# Patient Record
Sex: Female | Born: 1935 | Hispanic: Yes | Smoking: Never smoker
Health system: Southern US, Community
[De-identification: ages and names within clinical notes are randomized; demographics above are authoritative.]

## PROBLEM LIST (undated history)

## (undated) DIAGNOSIS — D649 Anemia, unspecified: Secondary | ICD-10-CM

## (undated) DIAGNOSIS — N189 Chronic kidney disease, unspecified: Secondary | ICD-10-CM

## (undated) HISTORY — PX: SHOULDER SURGERY: SHX246

## (undated) HISTORY — PX: CATARACT EXTRACTION, BILATERAL: SHX1313

---

## 2017-08-10 DIAGNOSIS — H01003 Unspecified blepharitis right eye, unspecified eyelid: Secondary | ICD-10-CM | POA: Diagnosis not present

## 2017-08-10 DIAGNOSIS — H01006 Unspecified blepharitis left eye, unspecified eyelid: Secondary | ICD-10-CM | POA: Diagnosis not present

## 2017-08-10 DIAGNOSIS — H2511 Age-related nuclear cataract, right eye: Secondary | ICD-10-CM | POA: Diagnosis not present

## 2017-08-10 DIAGNOSIS — H538 Other visual disturbances: Secondary | ICD-10-CM | POA: Diagnosis not present

## 2017-08-30 DIAGNOSIS — H01006 Unspecified blepharitis left eye, unspecified eyelid: Secondary | ICD-10-CM | POA: Diagnosis not present

## 2017-08-30 DIAGNOSIS — H527 Unspecified disorder of refraction: Secondary | ICD-10-CM | POA: Diagnosis not present

## 2017-08-30 DIAGNOSIS — H2513 Age-related nuclear cataract, bilateral: Secondary | ICD-10-CM | POA: Diagnosis not present

## 2017-08-30 DIAGNOSIS — H2511 Age-related nuclear cataract, right eye: Secondary | ICD-10-CM | POA: Diagnosis not present

## 2018-02-22 DIAGNOSIS — H2513 Age-related nuclear cataract, bilateral: Secondary | ICD-10-CM | POA: Diagnosis not present

## 2018-02-22 DIAGNOSIS — H02105 Unspecified ectropion of left lower eyelid: Secondary | ICD-10-CM | POA: Diagnosis not present

## 2018-02-22 DIAGNOSIS — H01003 Unspecified blepharitis right eye, unspecified eyelid: Secondary | ICD-10-CM | POA: Diagnosis not present

## 2018-02-22 DIAGNOSIS — H40033 Anatomical narrow angle, bilateral: Secondary | ICD-10-CM | POA: Diagnosis not present

## 2018-06-20 DIAGNOSIS — Z01818 Encounter for other preprocedural examination: Secondary | ICD-10-CM | POA: Diagnosis not present

## 2018-06-20 DIAGNOSIS — H2512 Age-related nuclear cataract, left eye: Secondary | ICD-10-CM | POA: Diagnosis not present

## 2018-06-20 DIAGNOSIS — E119 Type 2 diabetes mellitus without complications: Secondary | ICD-10-CM | POA: Diagnosis not present

## 2018-06-21 ENCOUNTER — Ambulatory Visit: Payer: Self-pay | Admitting: Family Medicine

## 2018-06-29 DIAGNOSIS — H1013 Acute atopic conjunctivitis, bilateral: Secondary | ICD-10-CM | POA: Diagnosis not present

## 2018-07-13 DIAGNOSIS — H2512 Age-related nuclear cataract, left eye: Secondary | ICD-10-CM | POA: Diagnosis not present

## 2018-07-13 DIAGNOSIS — H25812 Combined forms of age-related cataract, left eye: Secondary | ICD-10-CM | POA: Diagnosis not present

## 2018-08-04 ENCOUNTER — Ambulatory Visit (INDEPENDENT_AMBULATORY_CARE_PROVIDER_SITE_OTHER): Payer: Medicare Other | Admitting: Family Medicine

## 2018-08-04 ENCOUNTER — Encounter: Payer: Self-pay | Admitting: Family Medicine

## 2018-08-04 VITALS — BP 126/73 | HR 76 | Ht 60.0 in | Wt 89.8 lb

## 2018-08-04 DIAGNOSIS — K8050 Calculus of bile duct without cholangitis or cholecystitis without obstruction: Secondary | ICD-10-CM | POA: Diagnosis not present

## 2018-08-04 DIAGNOSIS — J3089 Other allergic rhinitis: Secondary | ICD-10-CM

## 2018-08-04 DIAGNOSIS — M810 Age-related osteoporosis without current pathological fracture: Secondary | ICD-10-CM | POA: Diagnosis not present

## 2018-08-04 DIAGNOSIS — E538 Deficiency of other specified B group vitamins: Secondary | ICD-10-CM

## 2018-08-04 DIAGNOSIS — R5382 Chronic fatigue, unspecified: Secondary | ICD-10-CM

## 2018-08-04 DIAGNOSIS — R636 Underweight: Secondary | ICD-10-CM | POA: Diagnosis not present

## 2018-08-04 DIAGNOSIS — E786 Lipoprotein deficiency: Secondary | ICD-10-CM | POA: Diagnosis not present

## 2018-08-04 DIAGNOSIS — K8044 Calculus of bile duct with chronic cholecystitis without obstruction: Secondary | ICD-10-CM | POA: Insufficient documentation

## 2018-08-04 DIAGNOSIS — R0989 Other specified symptoms and signs involving the circulatory and respiratory systems: Secondary | ICD-10-CM | POA: Diagnosis not present

## 2018-08-04 DIAGNOSIS — E559 Vitamin D deficiency, unspecified: Secondary | ICD-10-CM | POA: Diagnosis not present

## 2018-08-04 DIAGNOSIS — R7989 Other specified abnormal findings of blood chemistry: Secondary | ICD-10-CM | POA: Diagnosis not present

## 2018-08-04 MED ORDER — FLUTICASONE PROPIONATE 50 MCG/ACT NA SUSP: NASAL | 11 refills | Status: DC
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
## 2018-08-04 NOTE — Patient Instructions (Addendum)
- pt and son were told that we will not contact them re: lab results unless they are critical and we will discuss them all at f/up OV in near future    You can use over-the-counter afrin nasal spray for up to 3 days (NO longer than that) which will help acutely with nasal drainage/ congestion short term.     Also, sterile saline nasal rinses, such as Milta Deiters med or AYR sinus rinses, can be very helpful and should be done twice daily- especially throughout the allergy season.   Remember you should use distilled water or previously boiled water to do this.  Then you may use over-the-counter Flonase 1 spray each nostril twice daily after sinus rinses.  You can do this in addition to taking any Allegra or Claritin or Zyrtec etc. that you may be taking daily.  If your eyes tend to get an itchy or irritated feeling when your seasonal allergies get bad, you can use Naphcon-A over-the-counter eyedrops as needed     How to Increase Your Level of Physical Activity  Getting regular physical activity is important for your overall health and well-being. Most people do not get enough exercise. There are easy ways to increase your level of physical activity, even if you have not been very active in the past or you are just starting out. Why is physical activity important? Physical activity has many short-term and long-term health benefits. Regular exercise can:  Help you lose weight or maintain a healthy weight.  Strengthen your muscles and bones.  Boost your mood and improve self-esteem.  Reduce your risk of certain long-term (chronic) diseases, like heart disease, cancer, and diabetes.  Help you stay capable of walking and moving around (mobile) as you age.  Prevent accidents, such as falls, as you age.  Increase life expectancy.  What are the benefits of being physically active on a regular basis? In addition to improving your physical health, being physically active on most days of the week  can help you in ways that you may not expect. Benefits of regular physical activity may include:  Feeling good about your body.  Being able to move around more easily and for longer periods of time without getting tired (increased stamina).  Finding new sources of fun and enjoyment.  Meeting new people who share a common interest.  Being able to fight off illness better (enhanced immunity).  Being able to sleep better.  What can happen if I am not physically active on a regular basis? Not getting enough physical activity can lead to an unhealthy lifestyle and future health problems. This can increase your chances of:  Becoming overweight or obese.  Becoming sick.  Developing chronic illnesses, like heart disease or diabetes.  Having mental health problems, like depression or anxiety.  Having sleep problems.  Having trouble walking or getting yourself around (reduced mobility).  Injuring yourself in a fall as you get older.  What steps can I take to be more physically active?  Check with your health care provider about how to get started. Ask your health care provider what activities are safe for you.  Start out slowly. Walking or doing some simple chair exercises is a good place to start, especially if you have not been active before or for a long time.  Try to find activities that you enjoy. You are more likely to commit to an exercise routine if it does not feel like a chore.  If you have bone or  joint problems, choose low-impact exercises, like walking or swimming.  Include physical activity in your everyday routine.  Invite friends or family members to exercise with you. This also will help you commit to your workout plan.  Set goals that you can work toward.  Aim for at least 150 minutes of moderate-intensity exercise each week. Examples of moderate-intensity exercise include walking or riding a bike. Where to find more information:  Centers for Disease Control  and Prevention: BowlingGrip.is  [employer] on [place] www.http://villegas.org/  ChooseMyPlate: WirelessM[employer]gages.dk Contact a health care provider if:  You have headaches, muscle aches, or joint pain.  You feel dizzy or light-headed while exercising.  You faint.  You have chest pain while exercising. Summary  Exercise benefits your mind and body at any age, even if you are just starting out.  If you have a chronic illness or have not been active for a while, check with your health care provider before increasing your physical activity.  Choose activities that are safe and enjoyable for you.Ask your health care provider what activities are safe for you.  Start slowly. Tell your health care provider if you have problems as you start to increase your activity level. This information is not intended to replace advice given to you by your health care provider. Make sure you discuss any questions you have with your health care provider. Document Released: 11/25/2016 Document Revised: 11/25/2016 Document Reviewed: 11/25/2016 Elsevier Interactive Patient Education  [employer].

## 2018-08-07 LAB — COMPREHENSIVE METABOLIC PANEL
A/G RATIO: 2 (ref 1.2–2.2)
ALBUMIN: 4.9 g/dL — AB (ref 3.2–4.6)
ALT: 16 IU/L (ref 0–32)
AST: 24 IU/L (ref 0–40)
Alkaline Phosphatase: 78 IU/L (ref 39–117)
BUN/Creatinine Ratio: 35 — ABNORMAL HIGH (ref 12–28)
BUN: 41 mg/dL — ABNORMAL HIGH (ref 10–36)
Bilirubin Total: 1.7 mg/dL — ABNORMAL HIGH (ref 0.0–1.2)
CALCIUM: 9.3 mg/dL (ref 8.7–10.3)
CO2: 16 mmol/L — ABNORMAL LOW (ref 20–29)
Chloride: 110 mmol/L — ABNORMAL HIGH (ref 96–106)
Creatinine, Ser: 1.17 mg/dL — ABNORMAL HIGH (ref 0.57–1.00)
GFR, EST AFRICAN AMERICAN: 47 mL/min/{1.73_m2} — AB (ref >59)
GFR, EST NON AFRICAN AMERICAN: 41 mL/min/{1.73_m2} — AB (ref >59)
GLOBULIN, TOTAL: 2.4 g/dL (ref 1.5–4.5)
Glucose: 77 mg/dL (ref 65–99)
POTASSIUM: 4 mmol/L (ref 3.5–5.2)
Sodium: 149 mmol/L — ABNORMAL HIGH (ref 134–144)
TOTAL PROTEIN: 7.3 g/dL (ref 6.0–8.5)

## 2018-08-07 LAB — CBC WITH DIFFERENTIAL/PLATELET
BASOS: 0 %
Basophils Absolute: 0 10*3/uL (ref 0.0–0.2)
EOS (ABSOLUTE): 0 10*3/uL (ref 0.0–0.4)
EOS: 1 %
HEMATOCRIT: 28.1 % — AB (ref 34.0–46.6)
HEMOGLOBIN: 9.4 g/dL — AB (ref 11.1–15.9)
IMMATURE GRANS (ABS): 0 10*3/uL (ref 0.0–0.1)
IMMATURE GRANULOCYTES: 0 %
LYMPHS: 19 %
Lymphocytes Absolute: 0.8 10*3/uL (ref 0.7–3.1)
MCH: 29.7 pg (ref 26.6–33.0)
MCHC: 33.5 g/dL (ref 31.5–35.7)
MCV: 89 fL (ref 79–97)
Monocytes Absolute: 0.8 10*3/uL (ref 0.1–0.9)
Monocytes: 18 %
NEUTROS PCT: 62 %
Neutrophils Absolute: 2.6 10*3/uL (ref 1.4–7.0)
PLATELETS: 264 10*3/uL (ref 150–450)
RBC: 3.16 x10E6/uL — ABNORMAL LOW (ref 3.77–5.28)
RDW: 14.8 % (ref 12.3–15.4)
WBC: 4.2 10*3/uL (ref 3.4–10.8)

## 2018-08-07 LAB — LIPID PANEL
CHOLESTEROL TOTAL: 82 mg/dL — AB (ref 100–199)
Chol/HDL Ratio: 2.6 ratio (ref 0.0–4.4)
HDL: 31 mg/dL — ABNORMAL LOW (ref >39)
LDL CALC: 39 mg/dL (ref 0–99)
Triglycerides: 59 mg/dL (ref 0–149)
VLDL CHOLESTEROL CAL: 12 mg/dL (ref 5–40)

## 2018-08-07 LAB — HEMOGLOBIN A1C
ESTIMATED AVERAGE GLUCOSE: 85 mg/dL
Hgb A1c MFr Bld: 4.6 % — ABNORMAL LOW (ref 4.8–5.6)

## 2018-08-07 LAB — BILIRUBIN, FRACTIONATED(TOT/DIR/INDIR)
Bilirubin, Direct: 0.3 mg/dL (ref 0.00–0.40)
Bilirubin, Indirect: 1.4 mg/dL — ABNORMAL HIGH (ref 0.10–0.80)

## 2018-08-07 LAB — GAMMA GT: GGT: 10 IU/L (ref 0–60)

## 2018-08-07 LAB — TSH: TSH: 0.122 u[IU]/mL — AB (ref 0.450–4.500)

## 2018-08-07 LAB — VITAMIN D 25 HYDROXY (VIT D DEFICIENCY, FRACTURES): Vit D, 25-Hydroxy: 28.2 ng/mL — ABNORMAL LOW (ref 30.0–100.0)

## 2018-08-07 LAB — VITAMIN B12: Vitamin B-12: 859 pg/mL (ref 232–1245)

## 2018-08-17 ENCOUNTER — Ambulatory Visit (INDEPENDENT_AMBULATORY_CARE_PROVIDER_SITE_OTHER): Payer: Medicare Other | Admitting: Family Medicine

## 2018-08-17 ENCOUNTER — Encounter: Payer: Self-pay | Admitting: Family Medicine

## 2018-08-17 VITALS — BP 120/70 | HR 71 | Ht 60.0 in | Wt 89.3 lb

## 2018-08-17 DIAGNOSIS — K8050 Calculus of bile duct without cholangitis or cholecystitis without obstruction: Secondary | ICD-10-CM

## 2018-08-17 DIAGNOSIS — E786 Lipoprotein deficiency: Secondary | ICD-10-CM | POA: Insufficient documentation

## 2018-08-17 DIAGNOSIS — N183 Chronic kidney disease, stage 3 unspecified: Secondary | ICD-10-CM | POA: Insufficient documentation

## 2018-08-17 DIAGNOSIS — E559 Vitamin D deficiency, unspecified: Secondary | ICD-10-CM

## 2018-08-17 DIAGNOSIS — E860 Dehydration: Secondary | ICD-10-CM

## 2018-08-17 DIAGNOSIS — R7989 Other specified abnormal findings of blood chemistry: Secondary | ICD-10-CM

## 2018-08-17 DIAGNOSIS — M810 Age-related osteoporosis without current pathological fracture: Secondary | ICD-10-CM

## 2018-08-17 DIAGNOSIS — K8044 Calculus of bile duct with chronic cholecystitis without obstruction: Secondary | ICD-10-CM | POA: Diagnosis not present

## 2018-08-17 DIAGNOSIS — N1832 Chronic kidney disease, stage 3b: Secondary | ICD-10-CM | POA: Insufficient documentation

## 2018-08-17 DIAGNOSIS — D649 Anemia, unspecified: Secondary | ICD-10-CM

## 2018-08-17 MED ORDER — VITAMIN D-3 25 MCG (1000 UT) PO CAPS: 2.0000 | ORAL_CAPSULE | Freq: Every day | ORAL | Status: DC

## 2018-08-17 NOTE — Patient Instructions (Addendum)
Melissa please put in future labs for CBC, CMP, TSH, free T3 and free T4, vitamin D, A1c.  -Please make sure you do not forget to do the Hemoccult cards and drop them by at your convenience.   Nine ways to increase your "good" HDL cholesterol  High-density lipoprotein (HDL) is often referred to as the "good" cholesterol. Having high HDL levels helps carry cholesterol from your arteries to your liver, where it can be used or excreted.  Having high levels of HDL also has antioxidant and anti-inflammatory effects, and is linked to a reduced risk of heart disease (1, 2).  Most health experts recommend minimum blood levels of 40 mg/dl in men and 50 mg/dl in women.  While genetics definitely play a role, there are several other factors that affect HDL levels.  Here are nine healthy ways to raise your "good" HDL cholesterol.  1. Consume olive oil  two pieces of salmon on a plate olive oil being poured into a small dish Extra virgin olive oil may be more healthful than processed olive oils. Olive oil is one of the healthiest fats around.  A large analysis of 42 studies with more than 800,000 participants found that olive oil was the only source of monounsaturated fat that seemed to reduce heart disease risk (3).  Research has shown that one of olive oil's heart-healthy effects is an increase in HDL cholesterol. This effect is thought to be caused by antioxidants it contains called polyphenols (4, 5, 6, 7).  Extra virgin olive oil has more polyphenols than more processed olive oils, although the amount can still vary among different types and brands.  One study gave 200 healthy young men about 2 tablespoons (25 ml) of different olive oils per day for three weeks.  The researchers found that participants' HDL levels increased significantly more after they consumed the olive oil with the highest polyphenol content (6).  In another study, when 74 older adults consumed about 4 tablespoons (50  ml) of high-polyphenol extra virgin olive oil every day for six weeks, their HDL cholesterol increased by 6.5 mg/dl, on average (7).  In addition to raising HDL levels, olive oil has been found to boost HDL's anti-inflammatory and antioxidant function in studies of older people and individuals with high cholesterol levels ( 7, 8, 9).  Whenever possible, select high-quality, certified extra virgin olive oils, which tend to be highest in polyphenols.  Bottom line: Extra virgin olive oil with a high polyphenol content has been shown to increase HDL levels in healthy people, the elderly and individuals with high cholesterol.  2. Follow a low-carb or ketogenic diet  Low-carb and ketogenic diets provide a number of health benefits, including weight loss and reduced blood sugar levels.  They have also been shown to increase HDL cholesterol in people who tend to have lower levels.  This includes those who are obese, insulin resistant or diabetic (10, 11, 12, 13, 14, 15, 16, 17).  In one study, people with type 2 diabetes were split into two groups.  One followed a diet consuming less than 50 grams of carbs per day. The other followed a high-carb diet.  Although both groups lost weight, the low-carb group's HDL cholesterol increased almost twice as much as the high-carb group's did (14).  In another study, obese people who followed a low-carb diet experienced an increase in HDL cholesterol of 5 mg/dl overall.  Meanwhile, in the same study, the participants who ate a low-fat, high-carb diet showed a decrease  in HDL cholesterol (15).  This response may partially be due to the higher levels of fat people typically consume on low-carb diets.  One study in overweight women found that diets high in meat and cheese increased HDL levels by 5-8%, compared to a higher-carb diet (18).  What's more, in addition to raising HDL cholesterol, very-low-carb diets have been shown to decrease triglycerides and  improve several other risk factors for heart disease (13, 14, 16, 17).  Bottom line: Low-carb and ketogenic diets typically increase HDL cholesterol levels in people with diabetes, metabolic syndrome and obesity.  3. Exercise regularly  Being physically active is important for heart health.  Studies have shown that many different types of exercise are effective at raising HDL cholesterol, including strength training, high-intensity exercise and aerobic exercise (19, 20, 21, 22, 23, 24).  However, the biggest increases in HDL are typically seen with high-intensity exercise.  One small study followed women who were living with polycystic ovary syndrome (PCOS), which is linked to a higher risk of insulin resistance. The study required them to perform high-intensity exercise three times a week.  The exercise led to an increase in HDL cholesterol of 8 mg/dL after 10 weeks. The women also showed improvements in other health markers, including decreased insulin resistance and improved arterial function (23).  In a 12-week study, overweight men who performed high-intensity exercise experienced a 10% increase in HDL cholesterol.  In contrast, the low-intensity exercise group showed only a 2% increase and the endurance training group experienced no change (24).  However, even lower-intensity exercise seems to increase HDL's anti-inflammatory and antioxidant capacities, whether or not HDL levels change (20, 21, 25).  Overall, high-intensity exercise such as high-intensity interval training (HIIT) and high-intensity circuit training (HICT) may boost HDL cholesterol levels the most.  Bottom line: Exercising several times per week can help raise HDL cholesterol and enhance its anti-inflammatory and antioxidant effects. High-intensity forms of exercise may be especially effective.  4. Add coconut oil to your diet  Studies have shown that coconut oil may reduce appetite, increase metabolic rate and help  protect brain health, among other benefits.  Some people may be concerned about coconut oil's effects on heart health due to its high saturated fat content.  However, it appears that coconut oil is actually quite heart healthy.  Coconut oil tends to raise HDL cholesterol more than many other types of fat.  In addition, it may improve the ratio of low-density-lipoprotein (LDL) cholesterol, the "bad" cholesterol, to HDL cholesterol. Improving this ratio reduces heart disease risk (26, 27, 28, 29).  One study examined the health effects of coconut oil on 71 women with excess belly fat. The researchers found that participants who took coconut oil daily experienced increased HDL cholesterol and a lower LDL-to-HDL ratio.  In contrast, the group who took soybean oil daily had a decrease in HDL cholesterol and an increase in the LDL-to-HDL ratio (29).  Most studies have found these health benefits occur at a dosage of about 2 tablespoons (30 ml) of coconut oil per day. It's best to incorporate this into cooking rather than eating spoonfuls of coconut oil on their own.  Bottom line: Consuming 2 tablespoons (30 ml) of coconut oil per day may help increase HDL cholesterol levels.  5. Stop smoking  cigarette butt Quitting smoking can reduce the risk of heart disease and lung cancer. Smoking increases the risk of many health problems, including heart disease and lung cancer (30).  One of its negative  effects is a suppression of HDL cholesterol.  Some studies have found that quitting smoking can increase HDL levels. Indeed, one study found no significant differences in HDL levels between former smokers and people who had never smoked (31, 32, 33, 34, 35).  In a one-year study of more than 1,500 people, those who quit smoking had twice the increase in HDL as those who resumed smoking within the year. The number of large HDL particles also increased, which further reduced heart disease risk (32).  One  study followed smokers who switched from traditional cigarettes to electronic cigarettes for one year. They found that the switch was associated with an increase in HDL cholesterol of 5 mg/dl, on average (33).  When it comes to the effect of nicotine replacement patches on HDL levels, research results have been mixed.  One study found that nicotine replacement therapy led to higher HDL cholesterol. However, other research suggests that people who use nicotine patches likely won't see increases in HDL levels until after replacement therapy is completed (34, 36).  Even in studies where HDL cholesterol levels didn't increase after people quit smoking, HDL function improved, resulting in less inflammation and other beneficial effects on heart health (37).  Bottom line: Quitting smoking can increase HDL levels, improve HDL function and help protect heart health.  6. Lose weight  When overweight and obese people lose weight, their HDL cholesterol levels usually increase.  What's more, this benefit seems to occur whether weight loss is achieved by calorie counting, carb restriction, intermittent fasting, weight loss surgery or a combination of diet and exercise (16, 38, 39, 40, 41, 42).  One study examined HDL levels in more than 3,000 overweight and obese [place] adults who followed a lifestyle modification program for one year.  The researchers found that losing at least 6.6 lbs (3 kg) led to an increase in HDL cholesterol of 4 mg/dl, on average (41).  In another study, when obese people with type 2 diabetes consumed calorie-restricted diets that provided 20-30% of calories from protein, they experienced significant increases in HDL cholesterol levels (42).  The key to achieving and maintaining healthy HDL cholesterol levels is choosing the type of diet that makes it easiest for you to lose weight and keep it off.  Bottom Line: Several methods of weight loss have been shown to increase HDL  cholesterol levels in people who are overweight or obese.  7. Choose purple produce  Consuming purple-colored fruits and vegetables is a delicious way to potentially increase HDL cholesterol.  Purple produce contains antioxidants known as anthocyanins.  Studies using anthocyanin extracts have shown that they help fight inflammation, protect your cells from damaging free radicals and may also raise HDL cholesterol levels (43, 44, 45, 46).  In a 24-week study of 15 people with diabetes, those who took an anthocyanin supplement twice a day experienced a 19% increase in HDL cholesterol, on average, along with other improvements in heart health markers (45).  In another study, when people with cholesterol issues took anthocyanin extract for 12 weeks, their HDL cholesterol levels increased by 13.7% (46).  Although these studies used extracts instead of foods, there are several fruits and vegetables that are very high in anthocyanins. These include eggplant, purple corn, red cabbage, blueberries, blackberries and black raspberries.  Bottom line: Consuming fruits and vegetables rich in anthocyanins may help increase HDL cholesterol levels.  8. Eat fatty fish often  The omega-3 fats in fatty fish provide major benefits to heart health, including a  reduction in inflammation and better functioning of the cells that line your arteries (47, 48).  There's some research showing that eating fatty fish or taking fish oil may also help raise low levels of HDL cholesterol (49, 50, 51, 52, 53).  In a study of 33 heart disease patients, participants that consumed fatty fish four times per week experienced an increase in HDL cholesterol levels. The particle size of their HDL also increased (52).  In another study, overweight men who consumed herring five days a week for six weeks had a 5% increase in HDL cholesterol, compared with their levels after eating lean pork and chicken five days a week (53).  However,  there are a few studies that found no increase in HDL cholesterol in response to increased fish or omega-3 supplement intake (54, 55).  In addition to herring, other types of fatty fish that may help raise HDL cholesterol include salmon, sardines, mackerel and anchovies.  Bottom line: Eating fatty fish several times per week may help increase HDL cholesterol levels and provide other benefits to heart health.  9. Avoid artificial trans fats  Artificial trans fats have many negative health effects due to their inflammatory properties (56, 57).  There are two types of trans fats. One kind occurs naturally in animal products, including full-fat dairy.  In contrast, the artificial trans fats found in margarines and processed foods are created by adding hydrogen to unsaturated vegetable and seed oils. These fats are also known as industrial trans fats or partially hydrogenated fats.  Research has shown that, in addition to increasing inflammation and contributing to several health problems, these artificial trans fats may lower HDL cholesterol levels.  In one study, researchers compared how people's HDL levels responded when they consumed different margarines.  The study found that participants' HDL cholesterol levels were 10% lower after consuming margarine containing partially hydrogenated soybean oil, compared to their levels after consuming palm oil (58).  Another controlled study followed 40 adults who had diets high in different types of trans fats.  They found that HDL cholesterol levels in women were significantly lower after they consumed the diet high in industrial trans fats, compared to the diet containing naturally occurring trans fats (59).  To protect heart health and keep HDL cholesterol in the healthy range, it's best to avoid artificial trans fats altogether.  Bottom line: Artificial trans fats have been shown to lower HDL levels and increase inflammation, compared to other  fats.  Take home message  Although your HDL cholesterol levels are partly determined by your genetics, there are many things you can do to naturally increase your own levels.  Fortunately, the practices that raise HDL cholesterol often provide other health benefits as well.

## 2018-08-17 NOTE — Progress Notes (Signed)
Assessment and plan:  1. Normocytic normochromic anemia   2. CKD (chronic kidney disease), stage III (HCC)   3. Dehydration, moderate   4. Choledocholithiasis with chronic cholecystitis- not amenable to surgery due to age   84. Biliary colic symptom- intolerant to dairy and fats due to gallstone presence   6. Vitamin D deficiency   7. Postmenopausal bone loss   8. Low serum thyroid stimulating hormone (TSH)   9. Low HDL (under 40)     1. Reviewed recent lab work (08/04/2018) in depth with patient today.  All lab work within normal limits unless otherwise noted.  - Encouraged patient to continue taking One A Day Women's.  2. Thyroid = Low TSH - 0.122 - Patient asymptomatic at this time. - Re-check labs and add further thyroid labs for assessment in 4 months. - Advised patient to watch for symptoms of hyperthyroid and let us know if these occur.  Reviewed sx with patient today, including heart palpitations, sweating, shaking, anxiety.  3. Anemia - Hemoglobin at 9.4, no known cause - Reviewed evidence and potential causes of anemia with the patient today. - Patient given stool cards for assessment at home. - Advised patient to monitor for sx of excessive fatigue, exhaustion, pallor, unusual loss of energy, etc.  Patient understands to let us know if she experiences any of these symptoms. - Will re-check labs in 4 months to continue to monitor.  4. Stage III CKD - Serum creatinine is elevated. - Lab work shows evidence of dehydration. - STRONGLY encouraged patient to drink more water. - Will re-check CMP in 4 months to continue to monitor.  5. Bilirubin Elevated,  Gallstone Present (not amenable to surgery) - Reviewed that pt is stable at this time. - Continue diet and monitoring as recommended.  6. Vitamin D Deficiency - 28.2. - Begin 2000 IU's Vitamin D3 daily.  See med list today.  7. Cholesterol - low normal  range, low HDL. HDL = 31, low. LDL = 39. Triglycerides = 59. - If they don't bother her stomach, encouraged patient to eat healthy fats such as avocado, coconut oil, or fish oil daily.  8. HbA1c = low, 4.6. - Reviewed risk of hypoglycemia episodes. - Encouraged patient to keep an eye out for symptoms of low blood sugar. - Encouraged patient not to go long periods of time without eating.   9. BMI Counseling Explained to patient what BMI refers to, and what it means medically.    Told patient to think about it as a "medical risk stratification measurement" and how increasing BMI is associated with increasing risk/ or worsening state of various diseases such as hypertension, hyperlipidemia, diabetes, premature OA, depression etc.  American Heart Association guidelines for healthy diet, basically Mediterranean diet, and exercise guidelines of 30 minutes 5 days per week or more discussed in detail.  Health counseling performed.  All questions answered.  10. Lifestyle & Preventative Health Maintenance - Advised patient to continue working toward exercising to improve overall mental, physical, and emotional health.    - Encouraged patient to engage in daily physical activity, especially a formal exercise routine.  Recommended that the patient eventually strive for at least 150 minutes of moderate cardiovascular activity per week according to guidelines established by the [facility].   - Healthy dietary habits encouraged, including low-carb, and high amounts of lean protein in diet.   - Patient should also consume adequate amounts of water - half of body weight  in oz of water per day.   Education and routine counseling performed. Handouts provided.  11. Follow-Up - Re-check all labs in 4 months, including Vitamin D, CBC, CMP, free T3, free T4, TSH, and thyroid antibodies.  Will continue to monitor closely.  - Otherwise, return for chronic follow-up as scheduled and recommended.  - Patient  understands that she can return for acute concerns PRN.  Pt was in the office today for 32.5+ minutes, with over 50% time spent in face to face counseling of patients various medical conditions, treatment plans of those medical conditions including medicine management and lifestyle modification, strategies to improve health and well being; and in coordination of care. SEE ABOVE TREATMENT PLAN FOR DETAILS   Meds ordered this encounter  Medications  . Cholecalciferol (VITAMIN D-3) 1000 units CAPS    Sig: Take 2 capsules (2,000 Units total) by mouth daily.    Dispense:  60 capsule     Return for Follow-up 4 months for blood work nonfasting then OV with me 1 week later.   Anticipatory guidance and routine counseling done re: condition, txmnt options and need for follow up. All questions of patient's were answered.   Gross side effects, risk and benefits, and alternatives of medications discussed with patient.  Patient is aware that all medications have potential side effects and we are unable to predict every sideeffect or drug-drug interaction that may occur.  Expresses verbal understanding and consents to current therapy plan and treatment regiment.  Please see AVS handed out to patient at the end of our visit for additional patient instructions/ counseling done pertaining to today's office visit.  Note: This document was prepared using Dragon voice recognition software and may include unintentional dictation errors.    This document serves as a record of services personally performed by Mellody Dance, DO. It was created on her behalf by Toni Amend, a trained medical scribe. The creation of this record is based on the scribe's personal observations and the provider's statements to them.   I have reviewed the above medical documentation for accuracy and completeness and I concur.  Mellody Dance 08/18/18 2:53  PM   ----------------------------------------------------------------------------------------------------------------------  Subjective:   CC:   Julia Kerr is a 84 y.o. female who presents to [facility] today for review and discussion of recent bloodwork that was done.  1. All recent blood work that we ordered was reviewed with patient today.  Patient was counseled on all abnormalities and we discussed dietary and lifestyle changes that could help those values (also medications when appropriate).  Extensive health counseling performed and all patient's concerns/ questions were addressed.   Patient's son is translating for patient today.  Patient states that her nose drops have been working well.  Patient takes One A Day Women's multivitamins daily.  Stage 3 CKD Patient dehydrated; notes she drinks two or three cups of water per day.  Bilirubin Elevated,  Gallstone Present Patient has her gallbladder, and has a gallstone.  Notes she never eats anything fatty due to her gallstone.  Thyroid - Low TSH Patient denies heart palpitations, unusual hot or cold, denies feeling nervous or shaky more than usual.  Anemia Patient received blood in the past during her operation. Patient with history of B12 anemia, per son.  She was receiving shots of B12 because her body wouldn't absorb the pills.  Patient never had a colonoscopy.  Small Low Back Pain Patient has been experiencing some small pain in her  low back/hip area.  Patient thought that her pain was caused by walking around.  Son states that she makes tea with corn silk and this causes the pain to feel better.    Wt Readings from Last 3 Encounters:  08/17/18 89 lb 4.8 oz (40.5 kg)  08/04/18 89 lb 12.8 oz (40.7 kg)   BP Readings from Last 3 Encounters:  08/17/18 120/70  08/04/18 126/73   Pulse Readings from Last 3 Encounters:  08/17/18 71  08/04/18 76   BMI Readings from Last 3 Encounters:   08/17/18 17.44 kg/m  08/04/18 17.54 kg/m     Patient Care Team    Relationship Specialty Notifications Start End  Mellody Dance, DO PCP - General Family Medicine  08/04/18     Full medical history updated and reviewed in the office today  Patient Active Problem List   Diagnosis Date Noted  . Low serum thyroid stimulating hormone (TSH) 08/17/2018  . Normocytic normochromic anemia 08/17/2018  . CKD (chronic kidney disease), stage III ([place]) 08/17/2018  . Dehydration, moderate 08/17/2018  . Low HDL (under 40) 08/17/2018  . Vitamin D deficiency 08/04/2018  . B12 deficiency-requiring injections in past 08/04/2018  . Bruit of right carotid artery 08/04/2018  . Choledocholithiasis with chronic cholecystitis- not amenable to surgery due to age 89/16/2019  . Biliary colic symptom- intolerant to dairy and fats due to gallstone presence 08/04/2018  . Environmental and seasonal allergies 08/04/2018  . Low weight for height- bmi 17.5 08/04/2018  . Postmenopausal bone loss 08/04/2018  . Chronic fatigue- with B12 def 08/04/2018    History reviewed. No pertinent past medical history.  Past Surgical History:  Procedure Laterality Date  . SHOULDER SURGERY      Social History   Tobacco Use  . Smoking status: Never Smoker  . Smokeless tobacco: Never Used  Substance Use Topics  . Alcohol use: Never    Frequency: Never    Family Hx: History reviewed. No pertinent family history.   Medications: Current Outpatient Medications  Medication Sig Dispense Refill  . fluticasone (FLONASE) 50 MCG/ACT nasal spray 1 spray each nostril after sinus rinse twice daily 16 g 11  . olopatadine (PATANOL) 0.1 % ophthalmic solution PLEASE SEE ATTACHED FOR DETAILED DIRECTIONS  3  . Cholecalciferol (VITAMIN D-3) 1000 units CAPS Take 2 capsules (2,000 Units total) by mouth daily. 60 capsule    No current facility-administered medications for this visit.     Allergies:  No Known  Allergies   Review of Systems: General:   No F/C, wt loss Pulm:   No DIB, SOB, pleuritic chest pain Card:  No CP, palpitations Abd:  No n/v/d or pain Ext:  No inc edema from baseline  Objective:  Blood pressure 120/70, pulse 71, height 5' (1.524 m), weight 89 lb 4.8 oz (40.5 kg), SpO2 98 %. Body mass index is 17.44 kg/m. Gen:   Well NAD, A and O *3 HEENT:    [place]/AT, EOMI,  MMM Lungs:   Normal work of breathing. CTA B/L, no Wh, rhonchi Heart:   RRR, S1, S2 WNL's, no MRG Abd:   No gross distention Exts:    warm, pink,  Brisk capillary refill, warm and well perfused.  Psych:    No HI/SI, judgement and insight good, Euthymic mood. Full Affect.   Recent Results (from the past 2160 hour(s))  CBC with Differential/Platelet     Status: Abnormal   Collection Time: 08/04/18 12:34 PM  Result Value Ref Range   WBC  4.2 3.4 - 10.8 x10E3/uL   RBC 3.16 (L) 3.77 - 5.28 x10E6/uL   Hemoglobin 9.4 (L) 11.1 - 15.9 g/dL   Hematocrit 28.1 (L) 34.0 - 46.6 %   MCV 89 79 - 97 fL   MCH 29.7 26.6 - 33.0 pg   MCHC 33.5 31.5 - 35.7 g/dL   RDW 14.8 12.3 - 15.4 %   Platelets 264 150 - 450 x10E3/uL   Neutrophils 62 Not Estab. %   Lymphs 19 Not Estab. %   Monocytes 18 Not Estab. %   Eos 1 Not Estab. %   Basos 0 Not Estab. %   Neutrophils Absolute 2.6 1.4 - 7.0 x10E3/uL   Lymphocytes Absolute 0.8 0.7 - 3.1 x10E3/uL   Monocytes Absolute 0.8 0.1 - 0.9 x10E3/uL   EOS (ABSOLUTE) 0.0 0.0 - 0.4 x10E3/uL   Basophils Absolute 0.0 0.0 - 0.2 x10E3/uL   Immature Granulocytes 0 Not Estab. %   Immature Grans (Abs) 0.0 0.0 - 0.1 x10E3/uL  Comprehensive metabolic panel     Status: Abnormal   Collection Time: 08/04/18 12:34 PM  Result Value Ref Range   Glucose 77 65 - 99 mg/dL   BUN 41 (H) 10 - 36 mg/dL   Creatinine, Ser 1.17 (H) 0.57 - 1.00 mg/dL   GFR calc non Af Amer 41 (L) >59 mL/min/1.73   GFR calc Af Amer 47 (L) >59 mL/min/1.73   BUN/Creatinine Ratio 35 (H) 12 - 28   Sodium 149 (H) 134 - 144 mmol/L    Potassium 4.0 3.5 - 5.2 mmol/L   Chloride 110 (H) 96 - 106 mmol/L   CO2 16 (L) 20 - 29 mmol/L   Calcium 9.3 8.7 - 10.3 mg/dL   Total Protein 7.3 6.0 - 8.5 g/dL   Albumin 4.9 (H) 3.2 - 4.6 g/dL   Globulin, Total 2.4 1.5 - 4.5 g/dL   Albumin/Globulin Ratio 2.0 1.2 - 2.2   Bilirubin Total 1.7 (H) 0.0 - 1.2 mg/dL   Alkaline Phosphatase 78 39 - 117 IU/L   AST 24 0 - 40 IU/L   ALT 16 0 - 32 IU/L  Vitamin B12     Status: None   Collection Time: 08/04/18 12:34 PM  Result Value Ref Range   Vitamin B-12 859 232 - 1,245 pg/mL  VITAMIN D 25 Hydroxy (Vit-D Deficiency, Fractures)     Status: Abnormal   Collection Time: 08/04/18 12:34 PM  Result Value Ref Range   Vit D, 25-Hydroxy 28.2 (L) 30.0 - 100.0 ng/mL    Comment: Vitamin D deficiency has been defined by the Institute of Medicine and an Endocrine Society practice guideline as a level of serum 25-OH vitamin D less than 20 ng/mL (1,2). The Endocrine Society went on to further define vitamin D insufficiency as a level between 21 and 29 ng/mL (2). 1. IOM (Institute of Medicine). 2010. Dietary reference    intakes for calcium and D. [place]: The    [employer]. 2. Holick MF, Binkley Vigo, Bischoff-Ferrari HA, et al.    Evaluation, treatment, and prevention of vitamin D    deficiency: an Endocrine Society clinical practice    guideline. JCEM. 2011 Jul; 96(7):1911-30.   Lipid panel     Status: Abnormal   Collection Time: 08/04/18 12:34 PM  Result Value Ref Range   Cholesterol, Total 82 (L) 100 - 199 mg/dL   Triglycerides 59 0 - 149 mg/dL   HDL 31 (L) >39 mg/dL   VLDL Cholesterol Cal 12 5 -  40 mg/dL   LDL Calculated 39 0 - 99 mg/dL   Chol/HDL Ratio 2.6 0.0 - 4.4 ratio    Comment:                                   T. Chol/HDL Ratio                                             Men  Women                               1/2 Avg.Risk  3.4    3.3                                   Avg.Risk  5.0    4.4                                 2X Avg.Risk  9.6    7.1                                3X Avg.Risk 23.4   11.0   Hemoglobin A1c     Status: Abnormal   Collection Time: 08/04/18 12:34 PM  Result Value Ref Range   Hgb A1c MFr Bld 4.6 (L) 4.8 - 5.6 %    Comment:          Prediabetes: 5.7 - 6.4          Diabetes: >6.4          Glycemic control for adults with diabetes: <7.0    Est. average glucose Bld gHb Est-mCnc 85 mg/dL  Bilirubin, neonatal (fractionated - tot/dir/indir)     Status: Abnormal   Collection Time: 08/04/18 12:34 PM  Result Value Ref Range   Bilirubin, Direct 0.30 0.00 - 0.40 mg/dL   Bilirubin, Indirect 1.40 (H) 0.10 - 0.80 mg/dL  Gamma GT     Status: None   Collection Time: 08/04/18 12:34 PM  Result Value Ref Range   GGT 10 0 - 60 IU/L  TSH     Status: Abnormal   Collection Time: 08/04/18 12:34 PM  Result Value Ref Range   TSH 0.122 (L) 0.450 - 4.500 uIU/mL

## 2018-09-01 DIAGNOSIS — H25811 Combined forms of age-related cataract, right eye: Secondary | ICD-10-CM | POA: Diagnosis not present

## 2018-09-01 DIAGNOSIS — H2511 Age-related nuclear cataract, right eye: Secondary | ICD-10-CM | POA: Diagnosis not present

## 2018-12-25 ENCOUNTER — Encounter: Payer: Self-pay | Admitting: Family Medicine

## 2018-12-25 ENCOUNTER — Ambulatory Visit (INDEPENDENT_AMBULATORY_CARE_PROVIDER_SITE_OTHER): Payer: Medicare Other | Admitting: Family Medicine

## 2018-12-25 VITALS — BP 127/66 | HR 67 | Temp 98.4°F | Ht 60.0 in | Wt 89.0 lb

## 2018-12-25 DIAGNOSIS — E860 Dehydration: Secondary | ICD-10-CM

## 2018-12-25 DIAGNOSIS — N183 Chronic kidney disease, stage 3 unspecified: Secondary | ICD-10-CM

## 2018-12-25 DIAGNOSIS — J3089 Other allergic rhinitis: Secondary | ICD-10-CM | POA: Diagnosis not present

## 2018-12-25 DIAGNOSIS — E786 Lipoprotein deficiency: Secondary | ICD-10-CM

## 2018-12-25 DIAGNOSIS — E538 Deficiency of other specified B group vitamins: Secondary | ICD-10-CM

## 2018-12-25 DIAGNOSIS — D649 Anemia, unspecified: Secondary | ICD-10-CM | POA: Diagnosis not present

## 2018-12-25 DIAGNOSIS — R0989 Other specified symptoms and signs involving the circulatory and respiratory systems: Secondary | ICD-10-CM | POA: Diagnosis not present

## 2018-12-25 DIAGNOSIS — R7989 Other specified abnormal findings of blood chemistry: Secondary | ICD-10-CM

## 2018-12-25 DIAGNOSIS — R636 Underweight: Secondary | ICD-10-CM | POA: Diagnosis not present

## 2018-12-25 DIAGNOSIS — K8050 Calculus of bile duct without cholangitis or cholecystitis without obstruction: Secondary | ICD-10-CM

## 2018-12-25 DIAGNOSIS — M810 Age-related osteoporosis without current pathological fracture: Secondary | ICD-10-CM | POA: Diagnosis not present

## 2018-12-25 DIAGNOSIS — E559 Vitamin D deficiency, unspecified: Secondary | ICD-10-CM

## 2018-12-25 NOTE — Patient Instructions (Addendum)
Today we will re-check all labs -->  Vitamin D, CBC, CMP, free T3, free T4, TSH, and thyroid antibodies -We will place referral to nutritionist.  -I recommend you take Allegra daily for your environmental and seasonal allergies.  Please use the Patanol eyedrops from a previous doctor to help with your eye symptoms.  Naphcon-A is over-the-counter and can be helpful.  As discussed please take vitamin D Gummies at least 1000 to 2000 IUs daily as well as a multivitamin for older folks.  These can both be in the form of Gummies which can help with any GI side effects.  Also, please make sure you eat prior to taking them and to take them about 30 minutes after eating.     PLEASE NOTE:  Thank you for your time and patience today.  I hope you found your office visit with me encouraging and educational, as my goal is to help my patients become healthier and happier.  We will call or contact you via MyChart in the next couple of days, if labs are critically abnormal and need to be addressed immediately.  Otherwise you will usually hear from [place] within 1 week or so via phone call or MyChart.  Lastly, please note, if you have an active MyChart account, even if you are not using it and checking it regularly, this is how we will be contacting you regarding lab results, test results etc. So please remember to check your account 1 to 2 weeks after blood work or tests that we ordered is completed.    If you do not want to be emailed results and wish to be called instead, you will need to cancel the MyChart account.   Tyler at our front desk can do that for you.  If you need medication refills, please be sure to call your pharmacy to request them.   This is the fastest and easiest way for you to obtain refills.   However, if a refill is not appropriate, one of our staff members or the pharmacy should contact you to let you know why it was denied.  Also, once you receive your after visit summary at the end of your  appointment, please CAREFULLY and THOROUGHLY read your current medication list.    If you find inaccurate information, or if you take your medications differently than what is documented (even over-the-counter ones) please be sure to call the office and speak to one of our medical assistants about it so this can be corrected in your chart.    If you have any further questions or concerns please do not hesitate to contact us.   Thank you and we appreciate you choosing [place] as your primary care provider!    - Dr. Raliegh Scarlet and 'the Team' at [facility]      How to Increase Your Level of Physical Activity  Getting regular physical activity is important for your overall health and well-being. Most people do not get enough exercise. There are easy ways to increase your level of physical activity, even if you have not been very active in the past or you are just starting out. Why is physical activity important? Physical activity has many short-term and long-term health benefits. Regular exercise can:  Help you lose weight or maintain a healthy weight.  Strengthen your muscles and bones.  Boost your mood and improve self-esteem.  Reduce your risk of certain long-term (chronic) diseases, like heart disease, cancer, and diabetes.  Help you stay  capable of walking and moving around (mobile) as you age.  Prevent accidents, such as falls, as you age.  Increase life expectancy.  What are the benefits of being physically active on a regular basis? In addition to improving your physical health, being physically active on most days of the week can help you in ways that you may not expect. Benefits of regular physical activity may include:  Feeling good about your body.  Being able to move around more easily and for longer periods of time without getting tired (increased stamina).  Finding new sources of fun and enjoyment.  Meeting new people who share a common interest.  Being  able to fight off illness better (enhanced immunity).  Being able to sleep better.  What can happen if I am not physically active on a regular basis? Not getting enough physical activity can lead to an unhealthy lifestyle and future health problems. This can increase your chances of:  Becoming overweight or obese.  Becoming sick.  Developing chronic illnesses, like heart disease or diabetes.  Having mental health problems, like depression or anxiety.  Having sleep problems.  Having trouble walking or getting yourself around (reduced mobility).  Injuring yourself in a fall as you get older.  What steps can I take to be more physically active?  Check with your health care provider about how to get started. Ask your health care provider what activities are safe for you.  Start out slowly. Walking or doing some simple chair exercises is a good place to start, especially if you have not been active before or for a long time.  Try to find activities that you enjoy. You are more likely to commit to an exercise routine if it does not feel like a chore.  If you have bone or joint problems, choose low-impact exercises, like walking or swimming.  Include physical activity in your everyday routine.  Invite friends or family members to exercise with you. This also will help you commit to your workout plan.  Set goals that you can work toward.  Aim for at least 150 minutes of moderate-intensity exercise each week. Examples of moderate-intensity exercise include walking or riding a bike. Where to find more information:  Centers for Disease Control and Prevention: BowlingG[employer].is  President's [employer] on [employer], Sports & Nutrition www.http://villegas.org/  ChooseMyPlate: [employer].dk Contact a health care provider if:  You have headaches, muscle aches, or joint pain.  You feel dizzy or light-headed while exercising.  You  faint.  You have chest pain while exercising. Summary  Exercise benefits your mind and body at any age, even if you are just starting out.  If you have a chronic illness or have not been active for a while, check with your health care provider before increasing your physical activity.  Choose activities that are safe and enjoyable for you.Ask your health care provider what activities are safe for you.  Start slowly. Tell your health care provider if you have problems as you start to increase your activity level. This information is not intended to replace advice given to you by your health care provider. Make sure you discuss any questions you have with your health care provider. Document Released: 11/25/2016 Document Revised: 11/25/2016 Document Reviewed: 11/25/2016 Elsevier Interactive Patient Education  [employer].

## 2018-12-25 NOTE — Progress Notes (Signed)
Impression and Recommendations:    1. CKD (chronic kidney disease), stage III ([place])   2. Low serum thyroid stimulating hormone (TSH)   3. Normocytic normochromic anemia   4. Dehydration, moderate   5. Vitamin D deficiency   6. B12 deficiency-requiring injections in past   7. Bruit of right carotid artery   8. Low HDL (under 40)   9. Environmental and seasonal allergies   10. Biliary colic symptom- intolerant to dairy and fats due to gallstone presence   11. Postmenopausal bone loss   12. Low weight for height- bmi 17.5     1. Fasting lab work drawn today for re-check.  - Encouraged patient to take gummy versions of vitamins as recommended, including multivitamin.  - Encouraged patient to eat food in smaller amounts, and more frequently during the day.  Avoid eating a lot of food at one sitting.  Encouraged patient to avoid long periods of time without eating.  - Referral to nutritionist placed today.   2. CKD (chronic kidney disease), stage III ([facility]) - Serum creatinine elevated last check. - Last lab work with evidence of dehydration. - STRONGLY encouraged patient to drink more water. - Will re-check CMP today and continue to monitor.   3. Low serum thyroid stimulating hormone (TSH) - 0.122 last check - Patient asymptomatic. - Labs drawn for re-check today. - Will continue to monitor.   4. Normocytic normochromic anemia Last Hemoglobin at 9.4, no known cause - Labs drawn for re-check today. - Advised patient to monitor for sx of excessive fatigue, exhaustion, pallor, unusual loss of energy, etc.  Patient understands to let us know if she experiences any of these symptoms. - Will continue to monitor.   5. Dehydration, moderate - Encouraged patient to consume adequate amounts of water. - Will continue to monitor.   6. Vitamin D deficiency - Encouraged patient to begin gummy vitamin supplementation. - Encouraged 2000 IU's Vitamin D3 daily. - Labs drawn  for re-check today. - Will continue to monitor.   7. B12 deficiency-requiring injections in past - Encouraged patient to begin gummy vitamin supplementation. - Labs drawn for re-check today. - Will continue to monitor.   8. Bilirubin Elevated, Gallstone Present (not amenable to surgery) - Reviewed that pt is stable at this time. - Continue diet and monitoring as recommended. - Lab work drawn today. - Patient's son asks for referral to nutritionist today, to assess what patient may safely eat.   9. Bruit of right carotid artery - Slight bruit appreciated during appointment today.   10. Low HDL (under 40 last check) - Labs Drawn Today HDL = 31, low last check. LDL = 39 last check. Triglycerides = 59 last check.   11. Allergy Sx/Watery Eyes - Recommended plain Allegra, OTC, to help alleviate sx of watery eyes. - Patient may begin use of Naphcon-A eye drops to alleviate sx. - Patient may discontinue Flonase if not helping to alleviate sx. - Will continue to monitor.   12. Lifestyle & Preventative Health Maintenance - Advised patient to continue working toward exercising to improve overall mental, physical, and emotional health.  Encouraged patient to look into Silver Sneakers exercise programs.  - Discussed importance of weight-bearing exercise to improve bone health.  - American Heart Association guidelines for healthy diet, basically Mediterranean diet, and exercise guidelines of 30 minutes 5 days per week or more discussed in detail.  - Health counseling performed.  All questions answered.  - Reviewed the "spokes  of the wheel" of mood and health management.  Stressed the importance of ongoing prudent habits, including regular exercise, appropriate sleep hygiene, healthful dietary habits, and prayer/meditation to relax.  - Encouraged patient to engage in daily physical activity as tolerated, especially a formal exercise routine.  Recommended that the patient eventually  strive for at least 150 minutes of moderate cardiovascular activity per week according to guidelines established by the [facility].   - Healthy dietary habits encouraged, including low-carb, and high amounts of lean protein in diet.   - Patient should also consume adequate amounts of water.   Orders Placed This Encounter  Procedures  . T4, free  . TSH  . VITAMIN D 25 Hydroxy (Vit-D Deficiency, Fractures)  . T3, free  . Thyroid antibodies  . CBC  . Comprehensive metabolic panel  . Amb ref to Medical Nutrition Therapy-MNT     Medications Discontinued During This Encounter  Medication Reason  . Cholecalciferol (VITAMIN D-3) 1000 units CAPS Patient Preference  . fluticasone (FLONASE) 50 MCG/ACT nasal spray No longer needed (for PRN medications)  . olopatadine (PATANOL) 0.1 % ophthalmic solution No longer needed (for PRN medications)     Gross side effects, risk and benefits, and alternatives of medications and treatment plan in general discussed with patient.  Patient is aware that all medications have potential side effects and we are unable to predict every side effect or drug-drug interaction that may occur.   Patient will call with any questions prior to using medication if they have concerns.    Expresses verbal understanding and consents to current therapy and treatment regimen.  No barriers to understanding were identified.  Red flag symptoms and signs discussed in detail.  Patient expressed understanding regarding what to do in case of emergency\urgent symptoms  Please see AVS handed out to patient at the end of our visit for further patient instructions/ counseling done pertaining to today's office visit.   Return in about 4 months (around 04/25/2019), or ( needs photo) , for 2) patient will always need extended visit due to not speaking English-4 months.     Note:  This note was prepared with assistance of Dragon voice recognition software. Occasional wrong-word or sound-a-like  substitutions may have occurred due to the inherent limitations of voice recognition software.    This document serves as a record of services personally performed by Mellody Dance, DO. It was created on her behalf by Toni Amend, a trained medical scribe. The creation of this record is based on the scribe's personal observations and the provider's statements to them.    I have reviewed the above medical documentation for accuracy and completeness and I concur.  Mellody Dance, DO 12/25/2018 6:01 PM       -----------------------------------------------------------------------------------------------------------------------------------   Subjective:     HPI: Julia Kerr is a 85 y.o. female who presents to [place] [facility] today for issues as discussed below.  Son accompanies and is translating for his mother today.  Son denies overall changes in patient's health.  Denies headaches, dizziness, new neurological changes.  Per son, patient was taking vitamin D for a while and stopped; was taking the Flonase and then stopped because "it makes it worse, more runny."  Patient largely denies burning of the eyes.  Stomach & GI Concerns Patient discontinued Vitamin D "because it burned her esophagus."  Patient's son states that she doesn't want to take the One A Day Women's Vitamins at all.  She thinks that the  vitamins may affect her gallbladder.  She states that she can't eat milk or fat because of her gallbladder & stomach concerns.  Patient says she has upset stomach and wants medication to "help the food stay in her stomach."  Describes the symptoms as "it feels like it's just sitting there," a fullness.    When she eats during the day, she has coffee, breakfast, chicken soup, greens, beans.    Feels that her stomach is good today.  Physical Activity Patient says that she doesn't like sitting while at home; she walks all the time while at home.   However she is not engaging in a formal exercise program.  Notes her leg isn't the same since it was operated on; notes that her leg is a little stiff.  When she walks a lot, she experiences pain in that leg.    Wt Readings from Last 3 Encounters:  12/25/18 89 lb (40.4 kg)  08/17/18 89 lb 4.8 oz (40.5 kg)  08/04/18 89 lb 12.8 oz (40.7 kg)   BP Readings from Last 3 Encounters:  12/25/18 127/66  08/17/18 120/70  08/04/18 126/73   Pulse Readings from Last 3 Encounters:  12/25/18 67  08/17/18 71  08/04/18 76   BMI Readings from Last 3 Encounters:  12/25/18 17.38 kg/m  08/17/18 17.44 kg/m  08/04/18 17.54 kg/m     Patient Care Team    Relationship Specialty Notifications Start End  Mellody Dance, DO PCP - General Family Medicine  08/04/18      Patient Active Problem List   Diagnosis Date Noted  . Low serum thyroid stimulating hormone (TSH) 08/17/2018  . Normocytic normochromic anemia 08/17/2018  . CKD (chronic kidney disease), stage III ([place]) 08/17/2018  . Dehydration, moderate 08/17/2018  . Low HDL (under 40) 08/17/2018  . Vitamin D deficiency 08/04/2018  . B12 deficiency-requiring injections in past 08/04/2018  . Bruit of right carotid artery 08/04/2018  . Choledocholithiasis with chronic cholecystitis- not amenable to surgery due to age 22/16/2019  . Biliary colic symptom- intolerant to dairy and fats due to gallstone presence 08/04/2018  . Environmental and seasonal allergies 08/04/2018  . Low weight for height- bmi 17.5 08/04/2018  . Postmenopausal bone loss 08/04/2018  . Chronic fatigue- with B12 def 08/04/2018    Past Medical history, Surgical history, Family history, Social history, Allergies and Medications have been entered into the medical record, reviewed and changed as needed.    No outpatient medications have been marked as taking for the 12/25/18 encounter (Office Visit) with Mellody Dance, DO.    Allergies:  No Known Allergies   Review of  Systems:  A fourteen system review of systems was performed and found to be positive as per HPI.   Objective:   Blood pressure 127/66, pulse 67, temperature 98.4 F (36.9 C), height 5' (1.524 m), weight 89 lb (40.4 kg), SpO2 98 %. Body mass index is 17.38 kg/m. General:  Well Developed, well nourished, appropriate for stated age.  Neuro:  Alert and oriented,  extra-ocular muscles intact  HEENT:  Normocephalic, atraumatic, neck supple, + slight bruit of the right carotid.  Skin:  no gross rash, warm, pink. Cardiac:  RRR, S1 S2 Respiratory:  ECTA B/L and A/P, Not using accessory muscles, speaking in full sentences- unlabored. Vascular:  Ext warm, no cyanosis apprec.; cap RF less 2 sec. Psych:  No HI/SI, judgement and insight good, Euthymic mood. Full Affect.

## 2018-12-26 LAB — COMPREHENSIVE METABOLIC PANEL
A/G RATIO: 2.4 — AB (ref 1.2–2.2)
ALT: 19 IU/L (ref 0–32)
AST: 25 IU/L (ref 0–40)
Albumin: 5 g/dL — ABNORMAL HIGH (ref 3.2–4.6)
Alkaline Phosphatase: 73 IU/L (ref 39–117)
BILIRUBIN TOTAL: 1.7 mg/dL — AB (ref 0.0–1.2)
BUN/Creatinine Ratio: 29 — ABNORMAL HIGH (ref 12–28)
BUN: 35 mg/dL (ref 10–36)
CALCIUM: 9.6 mg/dL (ref 8.7–10.3)
CHLORIDE: 99 mmol/L (ref 96–106)
CO2: 22 mmol/L (ref 20–29)
Creatinine, Ser: 1.19 mg/dL — ABNORMAL HIGH (ref 0.57–1.00)
GFR calc Af Amer: 45 mL/min/{1.73_m2} — ABNORMAL LOW (ref >59)
GFR, EST NON AFRICAN AMERICAN: 39 mL/min/{1.73_m2} — AB (ref >59)
GLOBULIN, TOTAL: 2.1 g/dL (ref 1.5–4.5)
GLUCOSE: 76 mg/dL (ref 65–99)
POTASSIUM: 4.3 mmol/L (ref 3.5–5.2)
Sodium: 139 mmol/L (ref 134–144)
Total Protein: 7.1 g/dL (ref 6.0–8.5)

## 2018-12-26 LAB — CBC
Hematocrit: 29.9 % — ABNORMAL LOW (ref 34.0–46.6)
Hemoglobin: 9.7 g/dL — ABNORMAL LOW (ref 11.1–15.9)
MCH: 28.4 pg (ref 26.6–33.0)
MCHC: 32.4 g/dL (ref 31.5–35.7)
MCV: 87 fL (ref 79–97)
Platelets: 274 10*3/uL (ref 150–450)
RBC: 3.42 x10E6/uL — ABNORMAL LOW (ref 3.77–5.28)
RDW: 14.2 % (ref 11.7–15.4)
WBC: 3.8 10*3/uL (ref 3.4–10.8)

## 2018-12-26 LAB — T4, FREE: Free T4: 1.69 ng/dL (ref 0.82–1.77)

## 2018-12-26 LAB — T3, FREE: T3 FREE: 3.1 pg/mL (ref 2.0–4.4)

## 2018-12-26 LAB — TSH: TSH: 0.219 u[IU]/mL — ABNORMAL LOW (ref 0.450–4.500)

## 2018-12-26 LAB — THYROID ANTIBODIES
Thyroglobulin Antibody: <1 IU/mL (ref 0.0–0.9)
Thyroperoxidase Ab SerPl-aCnc: 10 IU/mL (ref 0–34)

## 2018-12-26 LAB — VITAMIN D 25 HYDROXY (VIT D DEFICIENCY, FRACTURES): VIT D 25 HYDROXY: 37.1 ng/mL (ref 30.0–100.0)

## 2019-01-10 ENCOUNTER — Encounter: Payer: Medicare Other | Attending: Family Medicine | Admitting: *Deleted

## 2019-01-10 VITALS — Ht <= 58 in | Wt 84.3 lb

## 2019-01-10 DIAGNOSIS — R636 Underweight: Secondary | ICD-10-CM | POA: Diagnosis not present

## 2019-01-10 NOTE — Progress Notes (Signed)
Medical Nutrition Therapy:  Appt start time: 1100 end time:  1200.  Assessment:  Primary concerns today: Patient has CKD with Vitamin D deficiency, moderate dehydration, anemia and complains of gall bladder pain when eating higher fat foods. She is now avoiding milk and cheese due to fear of gall bladder pain. Her weight is low for her height with BMI of 18.24. She lives on the same property as her youngest son, who attended today's visit. They state she is always active with knitting, sewing, gardening, and house work.   Preferred Learning Style:   No preference indicated   Learning Readiness:   Ready  Change in progress  MEDICATIONS: see list   DIETARY INTAKE: Usual eating pattern includes 3 meals and 2 snacks per day.  Everyday foods include good variety of all food groups.  Avoided foods include high fat foods.    24-hr recall:  B ( AM): 2-3 Ritz crackers or simple cookies or slice of plain bread Snk ( AM): 1 egg white with small serving of rice, whole grain food or corn  L ( 12 PM): chicken soup, skinless thigh, rice, 1 potato, cauliflower, cilantro, green onions Snk ( PM): 2-3 crackers occasionally D (5-6 PM): fish, mixed vegetables, lettuce, spinach, occasionally with rice or lentils Snk ( PM): no  Beverages: black coffee, water, OJ,   Usual physical activity: active all day, sewing, crocheting, gardening, knitting, painting,  Estimated energy needs: 1400 calories 158 g carbohydrates 105 g protein 39 g fat  Progress Towards Goal(s):  Some progress.   Nutritional Diagnosis:  NI-1.4 Inadequate energy intake As related to adequate food to maintain a healthy weight.  As evidenced by BMI of 18.    Intervention:  Nutrition Plan:  Nutrition counseling for weight gain and Vitamin D deficiency initiated. Discussed Carb Counting by food group as method of portion control, reading food labels, and benefits of continued activity.   Teaching Method Utilized: all of the  following Visual Auditory Hands on  Handouts given during visit include:  Yellow Card of Food Groups  Barriers to learning/adherence to lifestyle change: advanced age and reluctance to consume higher calorie foods due to fear of pain in stomach  Demonstrated degree of understanding via:  Teach Back   Monitoring/Evaluation:  Dietary intake, exercise, small frequent meals, and body weight prn.

## 2019-08-01 ENCOUNTER — Other Ambulatory Visit: Payer: Self-pay

## 2019-08-01 ENCOUNTER — Encounter: Payer: Self-pay | Admitting: Adult Health

## 2019-08-01 ENCOUNTER — Ambulatory Visit (INDEPENDENT_AMBULATORY_CARE_PROVIDER_SITE_OTHER): Payer: Medicare Other | Admitting: Adult Health

## 2019-08-01 DIAGNOSIS — W57XXXA Bitten or stung by nonvenomous insect and other nonvenomous arthropods, initial encounter: Secondary | ICD-10-CM | POA: Insufficient documentation

## 2019-08-01 DIAGNOSIS — S80862A Insect bite (nonvenomous), left lower leg, initial encounter: Secondary | ICD-10-CM

## 2019-08-01 MED ORDER — MUPIROCIN 2 % EX OINT: 1.0000 "application " | TOPICAL_OINTMENT | Freq: Two times a day (BID) | CUTANEOUS | 0 refills | Status: DC

## 2019-08-01 MED ORDER — DOXYCYCLINE HYCLATE 100 MG PO TABS: 100.0000 mg | ORAL_TABLET | Freq: Two times a day (BID) | ORAL | 0 refills | Status: DC

## 2019-08-01 NOTE — Assessment & Plan Note (Addendum)
Assessment and Plan: 12/25/2018 CMP GFR 39 Creat 1.19 Known CKD III LFTs-WNL Doxycyline 100mg  BID x 10days Bactroban topical Elevated LLE Continue to check BP/HR daily- call clinic if SBP <100 or DBP <45 OTC acetaminophen, NO NSAIDs for pain control If any Red Flag sx's develop seek immediate medical assistance- son communicated understanding/agreement Continue to social distance and wear a mask when in public  Follow Up Instructions: 1 week TeleMedicine with Dr. Raliegh Scarlet If any Red Flag sx's develop seek immediate medical assistance- son communicated understanding/agreement   I discussed the assessment and treatment plan with the patient. The patient was provided an opportunity to ask questions and all were answered. The patient agreed with the plan and demonstrated an understanding of the instructions.

## 2019-08-01 NOTE — Progress Notes (Signed)
Virtual Visit via Video Note  I connected with Julia Kerr on 08/01/19 at 10:30 AM EDT by a video enabled telemedicine application and verified that I am speaking with the correct person using two identifiers.  Location: Patient: Home Provider: In Clinic   I discussed the limitations of evaluation and management by telemedicine and the availability of in person appointments. The patient expressed understanding and agreed to proceed.  History of Present Illness: Julia Kerr and her son are using WebEx today for recent insect bite that occurred 4 days ago to front of shin LLE. Bite area has steadily become reddened and swollen. She reports constant dull ache of LLE, denies numbness/tinlging of L foot/toes She denies recent tick bite or previous hx of Lyme Disease She denies drainage from site She denies fever/night sweats/N/V/D/malaise/poor appetite She does reports slight increase in fatigue Current temp 98.26f oral Son was on WebEx and provided translation during visit Son reports SBP normal, with slight decrease in DBP- high 40s He checked her BP several times today Upon chart review her DBP normally runs 60-70s  12/25/2018 CMP GFR 39 Creat 1.19 Known CKD III LFTs-WNL  Patient Care Team    Relationship Specialty Notifications Start End  Mellody Dance, DO PCP - General Family Medicine  08/04/18     Patient Active Problem List   Diagnosis Date Noted  . Insect bite 08/01/2019  . Low serum thyroid stimulating hormone (TSH) 08/17/2018  . Normocytic normochromic anemia 08/17/2018  . CKD (chronic kidney disease), stage III ([place]) 08/17/2018  . Dehydration, moderate 08/17/2018  . Low HDL (under 40) 08/17/2018  . Vitamin D deficiency 08/04/2018  . B12 deficiency-requiring injections in past 08/04/2018  . Bruit of right carotid artery 08/04/2018  . Choledocholithiasis with chronic cholecystitis- not amenable to surgery due to age 09/04/2018  . Biliary colic symptom- intolerant to  dairy and fats due to gallstone presence 08/04/2018  . Environmental and seasonal allergies 08/04/2018  . Low weight for height- bmi 17.5 08/04/2018  . Postmenopausal bone loss 08/04/2018  . Chronic fatigue- with B12 def 08/04/2018     History reviewed. No pertinent past medical history.   Past Surgical History:  Procedure Laterality Date  . SHOULDER SURGERY       History reviewed. No pertinent family history.   Social History   Substance and Sexual Activity  Drug Use Never     Social History   Substance and Sexual Activity  Alcohol Use Never  . Frequency: Never     Social History   Tobacco Use  Smoking Status Never Smoker  Smokeless Tobacco Never Used     Outpatient Encounter Medications as of 08/01/2019  Medication Sig  . doxycycline (VIBRA-TABS) 100 MG tablet Take 1 tablet (100 mg total) by mouth 2 (two) times daily.  . mupirocin ointment (BACTROBAN) 2 % Apply 1 application topically 2 (two) times daily.   No facility-administered encounter medications on file as of 08/01/2019.     Allergies: Food  Body mass index is 16.56 kg/m.  Blood pressure (!) 143/44, pulse 74, temperature 98 F (36.7 C), temperature source Oral, height 5' (1.524 m), weight 84 lb 12.8 oz (38.5 kg). Review of Systems: General:   Denies fever, chills, unexplained weight loss.  Optho/Auditory:   Denies visual changes, blurred vision/LOV Respiratory:   Denies SOB, DOE more than baseline levels.  Cardiovascular:   Denies chest pain, palpitations, new onset peripheral edema  Gastrointestinal:   Denies nausea, vomiting, diarrhea.  Genitourinary: Denies dysuria, freq/ urgency,  flank pain or discharge from genitals.  Endocrine:     Denies hot or cold intolerance, polyuria, polydipsia. Musculoskeletal:   Denies unexplained myalgias, joint swelling, unexplained arthralgias, gait problems.  Skin:  Insect Bite +, Swelling+, Erythema +- LLE Neurological:     Denies dizziness, unexplained  weakness, numbness  Psychiatric/Behavioral:   Denies mood changes, suicidal or homicidal ideations, hallucinations    Observations/Objective: LLE shin- black area of eschar noted with significant erythema that extends to top of L foot. No open tissue noted. 1+ edema of LLE from shin to top of foot noted. She is able to move L toes well Asked family to send in picture to MyChart  Assessment and Plan: 12/25/2018 CMP GFR 39 Creat 1.19 Known CKD III LFTs-WNL Doxycyline 100mg  BID x 10days Bactroban topical Elevated LLE Continue to check BP/HR daily- call clinic if SBP <100 or DBP <45 OTC acetaminophen, NO NSAIDs for pain control If any Red Flag sx's develop seek immediate medical assistance- son communicated understanding/agreement Continue to social distance and wear a mask when in public  Follow Up Instructions: 1 week TeleMedicine with Dr. Raliegh Scarlet If any Red Flag sx's develop seek immediate medical assistance- son communicated understanding/agreement   I discussed the assessment and treatment plan with the patient. The patient was provided an opportunity to ask questions and all were answered. The patient agreed with the plan and demonstrated an understanding of the instructions.   The patient was advised to call back or seek an in-person evaluation if the symptoms worsen or if the condition fails to improve as anticipated.  I provided 25 minutes of non-face-to-face time during this encounter.   Esaw Grandchild, NP

## 2019-08-08 ENCOUNTER — Encounter: Payer: Self-pay | Admitting: Family Medicine

## 2019-08-08 ENCOUNTER — Ambulatory Visit (INDEPENDENT_AMBULATORY_CARE_PROVIDER_SITE_OTHER): Payer: Medicare Other | Admitting: Family Medicine

## 2019-08-08 ENCOUNTER — Other Ambulatory Visit: Payer: Self-pay

## 2019-08-08 VITALS — BP 111/45 | Wt 84.6 lb

## 2019-08-08 DIAGNOSIS — N183 Chronic kidney disease, stage 3 unspecified: Secondary | ICD-10-CM

## 2019-08-08 DIAGNOSIS — W57XXXD Bitten or stung by nonvenomous insect and other nonvenomous arthropods, subsequent encounter: Secondary | ICD-10-CM

## 2019-08-08 DIAGNOSIS — S80862D Insect bite (nonvenomous), left lower leg, subsequent encounter: Secondary | ICD-10-CM | POA: Diagnosis not present

## 2019-08-08 DIAGNOSIS — E860 Dehydration: Secondary | ICD-10-CM | POA: Diagnosis not present

## 2019-08-08 DIAGNOSIS — L03119 Cellulitis of unspecified part of limb: Secondary | ICD-10-CM

## 2019-08-08 NOTE — Progress Notes (Signed)
Virtual / live video office visit note for [employer], D.O- Primary Care Physician at [facility]   I connected with current patient today and beyond visually recognizing the correct individual, I verified that I am speaking with the correct person using two identifiers.  . Location of the patient: Home . Location of the provider: Office Only the patient (+/- their family members at pt's discretion) and myself were participating in the encounter    - This visit type was conducted due to national recommendations for restrictions regarding the COVID-19 Pandemic (e.g. social distancing) in an effort to limit this patient's exposure and mitigate transmission in our community.  This format is felt to be most appropriate for this patient at this time.   - The patient did have access to video technology today yet, we had technical difficulties with this method, requiring transitioning to audio only.    - No physical exam could be performed with this format, beyond that communicated to [place] by the patient/ family members as noted.   - Additionally my office staff/ schedulers discussed with the patient that there may be a monetary charge related to this service, depending on patient's medical insurance.   The patient expressed understanding, and agreed to proceed.      History of Present Illness:  Patient saw Valetta Fuller on 08/01/2019.  Her left lower leg was affected by redness and swelling after an insect bite.  Patient was placed on doxycycline oral and topical Bactroban.  Since then, patient's redness is going away; "just a little swollen in the ankle."  Patient's relative says "I don't think the swelling is related."  Says the leg is "very light pink now" and the only thing that is swollen is the ankle.  The redness and infection appears to be resolving.  Denies fevers or chills in the patient.  Note that the patient has been feeling a little tired but doing much better.  Blood Pressure  at Home The highest that the upper BP number goes to is 117.  Her bottom number goes down to 43.  States that patient's blood pressure today was measured at 11/45.  Prior to today, patient's lower blood pressure number has been in the low forties.  Average BP has been around 110/45.  - She denies new onset of: chest pain, exercise intolerance, shortness of breath, dizziness, visual changes, headache, new lower extremity swelling or claudication.   Last 3 blood pressure readings in our office are as follows: BP Readings from Last 3 Encounters:  08/08/19 (!) 111/45  08/01/19 (!) 143/44  12/25/18 127/66    Filed Weights   08/08/19 1337  Weight: 84 lb 9.6 oz (38.4 kg)        Impression and Recommendations:    1. Insect bite of left lower leg, subsequent encounter   2. Cellulitis of lower extremity, unspecified laterality   3. CKD (chronic kidney disease), stage III ([place])   4. Dehydration, moderate     - Difficulty hearing patient during appointment today due to technological difficulties.  Acute  Patient saw Valetta Fuller on 08/01/2019. - Per patient's son York Cerise, recovery going well. - Continue treatment as established.  See med list. - Reviewed that recovery will take 7-8 weeks b-4 she feels completely normal w/o fatigue etc.  - Advised patient to hydrate adequately-which has always been a challenge for patient. - Discussed that requirements for hydration go up in hot weather and when fighting infection. - Suggested providing a  large bottle of water for the patient to encourage water consumption..  Blood Pressure at Home - Reviewed goal blood pressure at home. - Discussed that if patient's average blood pressure has always landed around 111/45, this may be normal for the patient.  Advised to monitor the patient's blood pressure. - Discussed critical need for hydration to maintain blood pressure. - Check blood pressure morning and night and write down heart rate. - Patient and  family know to keep an eye out for dizziness, lightheadedness, or confusion-which patient has none of.. -Reminded patient son that lower blood pressures as long as patient is asymptomatic, is actually better for her chronic kidney disease - Red flag symptoms reviewed today.  - Return in a month to touch base to check on blood pressures and infection recovery.   - As part of my medical decision making, I reviewed the following data within the [place] History obtained from pt /family, CMA notes reviewed and incorporated if applicable, Labs reviewed, Radiograph/ tests reviewed if applicable and OV notes from prior OV's with me, as well as other specialists she/he has seen since seeing me last, were all reviewed and used in my medical decision making process today.   - Additionally, discussion had with patient regarding txmnt plan, their biases about that plan etc were used in my medical decision making today.   - The patient agreed with the plan and demonstrated an understanding of the instructions.   No barriers to understanding were identified.   - Red flag symptoms and signs discussed in detail.  Patient expressed understanding regarding what to do in case of emergency\ urgent symptoms.  The patient was advised to call back or seek an in-person evaluation if the symptoms worsen or if the condition fails to improve as anticipated.   Return for one mo- reck skin/ condition, have bp/P logs; sooner if concerns.   I provided 18 minutes of non-face-to-face time during this encounter,with over 50% of the time in direct counseling on patients medical conditions/ medical concerns.  Additional time was spent with charting and coordination of care after the actual visit commenced.   Note:  This note was prepared with assistance of Dragon voice recognition software. Occasional wrong-word or sound-a-like substitutions may have occurred due to the inherent limitations of voice recognition  software.  This document serves as a record of services personally performed by Mellody Dance, DO. It was created on her behalf by Toni Amend, a trained medical scribe. The creation of this record is based on the scribe's personal observations and the provider's statements to them.   I have reviewed the above medical documentation for accuracy and completeness and I concur.  Mellody Dance, DO 08/09/2019 7:18 PM      Patient Care Team    Relationship Specialty Notifications Start End  Mellody Dance, DO PCP - General Family Medicine  08/04/18     -Vitals obtained; medications/ allergies reconciled;  personal medical, social, Sx etc.histories were updated by CMA, reviewed by me and are reflected in chart  Patient Active Problem List   Diagnosis Date Noted  . Cellulitis of lower extremity 08/18/2019  . Insect bite 08/01/2019  . Low serum thyroid stimulating hormone (TSH) 08/17/2018  . Normocytic normochromic anemia 08/17/2018  . CKD (chronic kidney disease), stage III ([place]) 08/17/2018  . Dehydration, moderate 08/17/2018  . Low HDL (under 40) 08/17/2018  . Vitamin D deficiency 08/04/2018  . B12 deficiency-requiring injections in past 08/04/2018  . Bruit of right  carotid artery 08/04/2018  . Choledocholithiasis with chronic cholecystitis- not amenable to surgery due to age 40/16/2019  . Biliary colic symptom- intolerant to dairy and fats due to gallstone presence 08/04/2018  . Environmental and seasonal allergies 08/04/2018  . Low weight for height- bmi 17.5 08/04/2018  . Postmenopausal bone loss 08/04/2018  . Chronic fatigue- with B12 def 08/04/2018     Current Meds  Medication Sig  . doxycycline (VIBRA-TABS) 100 MG tablet Take 1 tablet (100 mg total) by mouth 2 (two) times daily.  . mupirocin ointment (BACTROBAN) 2 % Apply 1 application topically 2 (two) times daily.     Allergies  Allergen Reactions  . Food Other (See Comments)    Apples and strawberries  cause burning of lips     ROS:  See above HPI for pertinent positives and negatives   Objective:   Blood pressure (!) 111/45, weight 84 lb 9.6 oz (38.4 kg).  (if some vitals are omitted, this means that patient was UNABLE to obtain them even though they were asked to get them prior to OV today.  They were asked to call us at their earliest convenience with these once obtained.)  General: A & O * 3; visually in no acute distress; in usual state of health.  Skin: Visible skin appears normal and pt's usual skin color HEENT:  EOMI, head is normocephalic and atraumatic.  Sclera are anicteric. Neck has a good range of motion.  Lips are noncyanotic Chest: normal chest excursion and movement Respiratory: speaking in full sentences, no conversational dyspnea; no use of accessory muscles Psych: insight good, mood- appears full

## 2019-08-18 DIAGNOSIS — L03119 Cellulitis of unspecified part of limb: Secondary | ICD-10-CM | POA: Insufficient documentation

## 2020-01-15 ENCOUNTER — Telehealth: Payer: Self-pay | Admitting: Family Medicine

## 2020-01-15 DIAGNOSIS — Z9109 Other allergy status, other than to drugs and biological substances: Secondary | ICD-10-CM

## 2020-01-15 NOTE — Telephone Encounter (Signed)
Patient son is aware referral has been placed. AS, CMA

## 2020-01-15 NOTE — Telephone Encounter (Signed)
Pt's son / Roselyn Reef called states patient wishes to be referred to an allergy specialist before getting COVID vaccine.  --Forwarding request to med asst for review w/provider . & to Referral Coord.  --glh

## 2020-01-15 NOTE — Telephone Encounter (Signed)
Spoke with Opalski and she advised ok for referral. Referral order placed.

## 2020-02-06 ENCOUNTER — Ambulatory Visit (INDEPENDENT_AMBULATORY_CARE_PROVIDER_SITE_OTHER): Payer: Medicare Other | Admitting: Allergy

## 2020-02-06 ENCOUNTER — Encounter: Payer: Self-pay | Admitting: Allergy

## 2020-02-06 ENCOUNTER — Other Ambulatory Visit: Payer: Self-pay

## 2020-02-06 VITALS — BP 130/60 | HR 76 | Temp 98.5°F | Resp 16 | Ht <= 58 in | Wt 87.6 lb

## 2020-02-06 DIAGNOSIS — T781XXA Other adverse food reactions, not elsewhere classified, initial encounter: Secondary | ICD-10-CM | POA: Insufficient documentation

## 2020-02-06 DIAGNOSIS — Z7189 Other specified counseling: Secondary | ICD-10-CM

## 2020-02-06 DIAGNOSIS — T781XXD Other adverse food reactions, not elsewhere classified, subsequent encounter: Secondary | ICD-10-CM | POA: Diagnosis not present

## 2020-02-06 DIAGNOSIS — Z7185 Encounter for immunization safety counseling: Secondary | ICD-10-CM

## 2020-02-06 DIAGNOSIS — J310 Chronic rhinitis: Secondary | ICD-10-CM

## 2020-02-06 MED ORDER — AZELASTINE HCL 0.15 % NA SOLN: 1.0000 | Freq: Two times a day (BID) | NASAL | 5 refills | Status: DC | PRN

## 2020-02-06 NOTE — Patient Instructions (Addendum)
Start azelastine 1 spray per nostril twice a day as needed for drainage.  Food:  Avoid strawberries and apples for now.   Given clinical history, there are no reasons to avoid the COVID-vaccine. Recommend getting the COVID vaccine as the benefits outweigh the risks given her age.  Follow up in 2 months or sooner if needed.

## 2020-02-06 NOTE — Progress Notes (Signed)
New Patient Note  RE: Julia Kerr MRN: [id number] DOB: 10-12-1923 Date of Office Visit: 02/06/2020  Referring provider: Mellody Dance, DO Primary care provider: Mellody Dance, DO  Chief Complaint: Allergic Rhinitis  (runny nose, itchy eyes, ) and Food Intolerance (apples and strawberries cause burning of the lips)  History of Present Illness: I had the pleasure of seeing Julia Kerr for initial evaluation at the [facility] on 02/07/2020. She is a 86 y.o. female, who is referred here by Mellody Dance, DO for the evaluation of allergies. She is accompanied today by her son who provided/contributed to the history. Spanish interpreter is present.   Rhinitis: She reports symptoms of clear rhinorrhea, nasal congestion, itchy eyes and slight watery eyes. Symptoms have been going on for a few years. The symptoms are present all year around with worsening in winter. Other triggers include exposure to none. Anosmia: no. Headache: no. She has used fluticasone nasal spray a few days but stopped due to burning sensation. Sinus infections: no. Previous work up includes: no. Previous ENT evaluation: no. Previous sinus imaging: no. History of nasal polyps: no. Last eye exam: 1 year ago, has cataracts. History of reflux: no.  Food: She reports food allergy to apples and strawberries. This has been going on for many years. This usually happens within seconds of eating these foods and is usually in the form of painful mouth or tongue. Denies any associated rash or itching. The symptoms usually last for 30 minutes. Improves after drinking water or eating lemons.   Past work up includes: none. Dietary History: patient has been eating other foods including limited milk due to lactose intolerance, eggs, sesame, shellfish, seafood, soy, wheat, meats, fruits and vegetables. Limited egg, peanuts, tree nuts.   She reports reading labels and avoiding strawberries, apple in diet  completely.   COVID vaccine: She had the flu shot many years ago and broke out in rash on her arm. Since then she has not gotten any other vaccines. Patient never had a colonoscopy and does not take Miralax. No previous severe allergic reactions requiring ER visit.   Assessment and Plan: Julia Kerr is a 86 y.o. female with: Chronic rhinitis Main complaint of perennial rhinorrhea with some nasal congestion for the past few years with worsening in the winter. No triggers noted. No history of allergic rhinitis in the past. No previous ENT evaluation. Tried Fluticasone nasal spray for a few days but stopped due to burning sensation.  Discussed with patient and family that is highly unlikely that someone at her age develops a new environmental allergy without a change in her environment.   Will treat symptoms with azelastine 1 spray per nostril twice a day as needed for drainage. Demonstrated proper use.   Will hold off any type of environmental allergy testing for now.   Adverse food reaction Concerned about allergy to apples and strawberries. Usually develops painful tongue and mouth that resolve within 30 minutes after drinking water. No associated pruritus or rash.   Her symptoms are not typical of an IgE mediated reaction which is what skin prick testing looks for. Patient is not interested in eating these 2 fruits at this time.   Continue to avoid strawberries and apples.   Vaccine counseling Patient concerned about side effects/reactions to the COVID-19 vaccine. She had localized rash after flu vaccine many years ago and she has not gotten any other vaccines since then. No history of taking Miralax or undergoing colonoscopy prep. No previous  history of severe allergic reactions to medications or vaccines.   Discussed with patient and family member that given her clinical history there are no contraindications for her not to get the vaccine.   Given her age, recommend the COVID vaccine as  the benefits outweigh the risks.   Return in about 2 months (around 04/05/2020).  Meds ordered this encounter  Medications  . Azelastine HCl 0.15 % SOLN    Sig: Place 1 spray into the nose 2 (two) times daily as needed (drainage).    Dispense:  30 mL    Refill:  5   Other allergy screening: Asthma: no Medication allergy: no Hymenoptera allergy: no Urticaria: no Eczema:no History of recurrent infections suggestive of immunodeficency: no  Diagnostics: None.  Past Medical History: Patient Active Problem List   Diagnosis Date Noted  . Chronic rhinitis 02/06/2020  . Adverse food reaction 02/06/2020  . Vaccine counseling 02/06/2020  . Cellulitis of lower extremity 08/18/2019  . Insect bite 08/01/2019  . Low serum thyroid stimulating hormone (TSH) 08/17/2018  . Normocytic normochromic anemia 08/17/2018  . CKD (chronic kidney disease), stage III 08/17/2018  . Dehydration, moderate 08/17/2018  . Low HDL (under 40) 08/17/2018  . Vitamin D deficiency 08/04/2018  . B12 deficiency-requiring injections in past 08/04/2018  . Bruit of right carotid artery 08/04/2018  . Choledocholithiasis with chronic cholecystitis- not amenable to surgery due to age 39/16/2019  . Biliary colic symptom- intolerant to dairy and fats due to gallstone presence 08/04/2018  . Environmental and seasonal allergies 08/04/2018  . Low weight for height- bmi 17.5 08/04/2018  . Postmenopausal bone loss 08/04/2018  . Chronic fatigue- with B12 def 08/04/2018   History reviewed. No pertinent past medical history. Past Surgical History: Past Surgical History:  Procedure Laterality Date  . SHOULDER SURGERY     Medication List:  Current Outpatient Medications  Medication Sig Dispense Refill  . Azelastine HCl 0.15 % SOLN Place 1 spray into the nose 2 (two) times daily as needed (drainage). 30 mL 5   No current facility-administered medications for this visit.   Allergies: Allergies  Allergen Reactions  .  Food Other (See Comments)    Apples and strawberries cause burning of lips   Social History: Social History   Socioeconomic History  . Marital status: Widowed    Spouse name: Not on file  . Number of children: Not on file  . Years of education: Not on file  . Highest education level: Not on file  Occupational History  . Not on file  Tobacco Use  . Smoking status: Never Smoker  . Smokeless tobacco: Never Used  Substance and Sexual Activity  . Alcohol use: Never  . Drug use: Never  . Sexual activity: Not Currently  Other Topics Concern  . Not on file  Social History Narrative  . Not on file   Social Determinants of Health   Financial Resource Strain:   . Difficulty of Paying Living Expenses: Not on file  Food Insecurity:   . Worried About Charity fundraiser in the Last Year: Not on file  . Ran Out of Food in the Last Year: Not on file  Transportation Needs:   . Lack of Transportation (Medical): Not on file  . Lack of Transportation (Non-Medical): Not on file  Physical Activity:   . Days of Exercise per Week: Not on file  . Minutes of Exercise per Session: Not on file  Stress:   . Feeling of Stress : Not  on file  Social Connections:   . Frequency of Communication with Friends and Family: Not on file  . Frequency of Social Gatherings with Friends and Family: Not on file  . Attends Religious Services: Not on file  . Active Member of Clubs or Organizations: Not on file  . Attends Archivist Meetings: Not on file  . Marital Status: Not on file   Lives in a 84 year old home. Smoking: denies Occupation: not working  Programme researcher, broadcasting/film/video HistoryFreight forwarder in the house: no Charity fundraiser in the family room: no Carpet in the bedroom: no Heating: gas Cooling: central Pet: no  Family History: History reviewed. No pertinent family history. Problem                               Relation Asthma                                   No  Eczema                                 No  Food allergy                          No  Allergic rhino conjunctivitis     No   Review of Systems  Constitutional: Negative for appetite change, chills, fever and unexpected weight change.  HENT: Positive for congestion and rhinorrhea.   Eyes: Positive for itching.  Respiratory: Negative for cough, chest tightness, shortness of breath and wheezing.   Cardiovascular: Negative for chest pain.  Gastrointestinal: Negative for abdominal pain.  Genitourinary: Negative for difficulty urinating.  Skin: Negative for rash.  Neurological: Negative for headaches.   Objective: BP 130/60 (BP Location: Left Arm, Patient Position: Sitting, Cuff Size: Normal) Comment (Cuff Size): Recheck/BF  Pulse 76   Temp 98.5 F (36.9 C) (Temporal)   Resp 16   Ht 4' 8.3" (1.43 m)   Wt 87 lb 9.6 oz (39.7 kg)   SpO2 99%   BMI 19.43 kg/m  Body mass index is 19.43 kg/m. Physical Exam  Constitutional: She is oriented to person, place, and time. She appears well-developed and well-nourished.  HENT:  Head: Normocephalic and atraumatic.  Right Ear: External ear normal.  Left Ear: External ear normal.  Nose: Nose normal.  Mouth/Throat: Oropharynx is clear and moist.  Eyes: Conjunctivae and EOM are normal.  Cardiovascular: Normal rate, regular rhythm and normal heart sounds. Exam reveals no gallop and no friction rub.  No murmur heard. Pulmonary/Chest: Effort normal and breath sounds normal. She has no wheezes. She has no rales.  Abdominal: Soft.  Musculoskeletal:     Cervical back: Neck supple.  Neurological: She is alert and oriented to person, place, and time.  Skin: Skin is warm. No rash noted.  Psychiatric: She has a normal mood and affect. Her behavior is normal.  Nursing note and vitals reviewed.  The plan was reviewed with the patient/family, and all questions/concerned were addressed.  It was my pleasure to see Julia Kerr today and participate in her care. Please feel free to contact me with  any questions or concerns.  Sincerely,  Rexene Alberts, DO Allergy & Immunology  [facility] office: [phone] [facility] office: [phone]  [place] office: [phone]

## 2020-02-07 ENCOUNTER — Encounter: Payer: Self-pay | Admitting: Allergy

## 2020-02-07 NOTE — Assessment & Plan Note (Signed)
Patient concerned about side effects/reactions to the COVID-19 vaccine. She had localized rash after flu vaccine many years ago and she has not gotten any other vaccines since then. No history of taking Miralax or undergoing colonoscopy prep. No previous history of severe allergic reactions to medications or vaccines.   Discussed with patient and family member that given her clinical history there are no contraindications for her not to get the vaccine.   Given her age, recommend the COVID vaccine as the benefits outweigh the risks.

## 2020-02-07 NOTE — Assessment & Plan Note (Signed)
Concerned about allergy to apples and strawberries. Usually develops painful tongue and mouth that resolve within 30 minutes after drinking water. No associated pruritus or rash.   Her symptoms are not typical of an IgE mediated reaction which is what skin prick testing looks for. Patient is not interested in eating these 2 fruits at this time.   Continue to avoid strawberries and apples.

## 2020-02-07 NOTE — Assessment & Plan Note (Signed)
Main complaint of perennial rhinorrhea with some nasal congestion for the past few years with worsening in the winter. No triggers noted. No history of allergic rhinitis in the past. No previous ENT evaluation. Tried Fluticasone nasal spray for a few days but stopped due to burning sensation.  Discussed with patient and family that is highly unlikely that someone at her age develops a new environmental allergy without a change in her environment.   Will treat symptoms with azelastine 1 spray per nostril twice a day as needed for drainage. Demonstrated proper use.   Will hold off any type of environmental allergy testing for now.

## 2020-04-06 NOTE — Progress Notes (Deleted)
Follow Up Note  RE: Julia Kerr MRN: [id number] DOB: 1923/07/19 Date of Office Visit: 04/07/2020  Referring provider: Mellody Dance, DO Primary care provider: Mellody Dance, DO  Chief Complaint: No chief complaint on file.  History of Present Illness: I had the pleasure of seeing [facility] for a follow up visit at the [facility] [place] [facility] [place] on 04/06/2020. She is a 86 y.o. female, who is being followed for rhinitis, adverse food reaction. Her previous allergy office visit was on 02/06/2020 with Dr. Maudie Mercury. Today is a regular follow up visit.  Chronic rhinitis Main complaint of perennial rhinorrhea with some nasal congestion for the past few years with worsening in the winter. No triggers noted. No history of allergic rhinitis in the past. No previous ENT evaluation. Tried Fluticasone nasal spray for a few days but stopped due to burning sensation.  Discussed with patient and family that is highly unlikely that someone at her age develops a new environmental allergy without a change in her environment.   Will treat symptoms with azelastine 1 spray per nostril twice a day as needed for drainage. Demonstrated proper use.   Will hold off any type of environmental allergy testing for now.   Adverse food reaction Concerned about allergy to apples and strawberries. Usually develops painful tongue and mouth that resolve within 30 minutes after drinking water. No associated pruritus or rash.   Her symptoms are not typical of an IgE mediated reaction which is what skin prick testing looks for. Patient is not interested in eating these 2 fruits at this time.   Continue to avoid strawberries and apples.   Vaccine counseling Patient concerned about side effects/reactions to the COVID-19 vaccine. She had localized rash after flu vaccine many years ago and she has not gotten any other vaccines since then. No history of taking Miralax or undergoing colonoscopy prep. No previous history  of severe allergic reactions to medications or vaccines.   Discussed with patient and family member that given her clinical history there are no contraindications for her not to get the vaccine.   Given her age, recommend the COVID vaccine as the benefits outweigh the risks.   Return in about 2 months (around 04/05/2020).  Assessment and Plan: Julia Kerr is a 86 y.o. female with: No problem-specific Assessment & Plan notes found for this encounter.  No follow-ups on file.  No orders of the defined types were placed in this encounter.  Lab Orders  No laboratory test(s) ordered today    Diagnostics: Spirometry:  Tracings reviewed. Her effort: {Blank single:19197::"Good reproducible efforts.","It was hard to get consistent efforts and there is a question as to whether this reflects a maximal maneuver.","Poor effort, data can not be interpreted."} FVC: ***L FEV1: ***L, ***% predicted FEV1/FVC ratio: ***% Interpretation: {Blank single:19197::"Spirometry consistent with mild obstructive disease","Spirometry consistent with moderate obstructive disease","Spirometry consistent with severe obstructive disease","Spirometry consistent with possible restrictive disease","Spirometry consistent with mixed obstructive and restrictive disease","Spirometry uninterpretable due to technique","Spirometry consistent with normal pattern","No overt abnormalities noted given today's efforts"}.  Please see scanned spirometry results for details.  Skin Testing: {Blank single:19197::"Select foods","Environmental allergy panel","Environmental allergy panel and select foods","Food allergy panel","None","Deferred due to recent antihistamines use"}. Positive test to: ***. Negative test to: ***.  Results discussed with patient/family.   Medication List:  Current Outpatient Medications  Medication Sig Dispense Refill  . Azelastine HCl 0.15 % SOLN Place 1 spray into the nose 2 (two) times daily as needed (drainage). 30  mL 5  No current facility-administered medications for this visit.   Allergies: Allergies  Allergen Reactions  . Food Other (See Comments)    Apples and strawberries cause burning of lips   I reviewed her past medical history, social history, family history, and environmental history and no significant changes have been reported from her previous visit.  Review of Systems  Constitutional: Negative for appetite change, chills, fever and unexpected weight change.  HENT: Positive for congestion and rhinorrhea.   Eyes: Positive for itching.  Respiratory: Negative for cough, chest tightness, shortness of breath and wheezing.   Cardiovascular: Negative for chest pain.  Gastrointestinal: Negative for abdominal pain.  Genitourinary: Negative for difficulty urinating.  Skin: Negative for rash.  Neurological: Negative for headaches.   Objective: There were no vitals taken for this visit. There is no height or weight on file to calculate BMI. Physical Exam  Constitutional: She is oriented to person, place, and time. She appears well-developed and well-nourished.  HENT:  Head: Normocephalic and atraumatic.  Right Ear: External ear normal.  Left Ear: External ear normal.  Nose: Nose normal.  Mouth/Throat: Oropharynx is clear and moist.  Eyes: Conjunctivae and EOM are normal.  Cardiovascular: Normal rate, regular rhythm and normal heart sounds. Exam reveals no gallop and no friction rub.  No murmur heard. Pulmonary/Chest: Effort normal and breath sounds normal. She has no wheezes. She has no rales.  Abdominal: Soft.  Musculoskeletal:     Cervical back: Neck supple.  Neurological: She is alert and oriented to person, place, and time.  Skin: Skin is warm. No rash noted.  Psychiatric: She has a normal mood and affect. Her behavior is normal.  Nursing note and vitals reviewed.  Previous notes and tests were reviewed. The plan was reviewed with the patient/family, and all  questions/concerned were addressed.  It was my pleasure to see Julia Kerr today and participate in her care. Please feel free to contact me with any questions or concerns.  Sincerely,  Rexene Alberts, DO Allergy & Immunology  [facility] office: [phone] [facility] office: [place] office: [phone]

## 2020-04-07 ENCOUNTER — Ambulatory Visit: Payer: PRIVATE HEALTH INSURANCE | Admitting: Allergy

## 2020-04-07 DIAGNOSIS — J310 Chronic rhinitis: Secondary | ICD-10-CM

## 2020-04-07 DIAGNOSIS — T781XXD Other adverse food reactions, not elsewhere classified, subsequent encounter: Secondary | ICD-10-CM

## 2020-04-16 DIAGNOSIS — H04123 Dry eye syndrome of bilateral lacrimal glands: Secondary | ICD-10-CM | POA: Diagnosis not present

## 2020-07-21 ENCOUNTER — Ambulatory Visit (INDEPENDENT_AMBULATORY_CARE_PROVIDER_SITE_OTHER): Payer: Medicare Other | Admitting: Physician Assistant

## 2020-07-21 ENCOUNTER — Other Ambulatory Visit: Payer: Self-pay

## 2020-07-21 ENCOUNTER — Encounter: Payer: Self-pay | Admitting: Physician Assistant

## 2020-07-21 VITALS — BP 147/75 | HR 71 | Temp 97.6°F | Ht <= 58 in | Wt 86.9 lb

## 2020-07-21 DIAGNOSIS — R636 Underweight: Secondary | ICD-10-CM | POA: Diagnosis not present

## 2020-07-21 DIAGNOSIS — R7989 Other specified abnormal findings of blood chemistry: Secondary | ICD-10-CM | POA: Diagnosis not present

## 2020-07-21 DIAGNOSIS — N183 Chronic kidney disease, stage 3 unspecified: Secondary | ICD-10-CM

## 2020-07-21 DIAGNOSIS — E538 Deficiency of other specified B group vitamins: Secondary | ICD-10-CM | POA: Diagnosis not present

## 2020-07-21 DIAGNOSIS — H6591 Unspecified nonsuppurative otitis media, right ear: Secondary | ICD-10-CM

## 2020-07-21 DIAGNOSIS — R5382 Chronic fatigue, unspecified: Secondary | ICD-10-CM | POA: Diagnosis not present

## 2020-07-21 DIAGNOSIS — E786 Lipoprotein deficiency: Secondary | ICD-10-CM | POA: Diagnosis not present

## 2020-07-21 DIAGNOSIS — J310 Chronic rhinitis: Secondary | ICD-10-CM

## 2020-07-21 DIAGNOSIS — E559 Vitamin D deficiency, unspecified: Secondary | ICD-10-CM | POA: Diagnosis not present

## 2020-07-21 DIAGNOSIS — Z0000 Encounter for general adult medical examination without abnormal findings: Secondary | ICD-10-CM | POA: Diagnosis not present

## 2020-07-21 DIAGNOSIS — D649 Anemia, unspecified: Secondary | ICD-10-CM

## 2020-07-21 DIAGNOSIS — R0989 Other specified symptoms and signs involving the circulatory and respiratory systems: Secondary | ICD-10-CM | POA: Diagnosis not present

## 2020-07-21 MED ORDER — LEVOCETIRIZINE DIHYDROCHLORIDE 5 MG PO TABS: ORAL_TABLET | ORAL | 2 refills | Status: DC

## 2020-07-21 NOTE — Patient Instructions (Addendum)
Rinitis alrgica en adultos Allergic Rhinitis, Adult La rinitis alrgica es una reaccin alrgica que afecta la membrana mucosa que se encuentra en la nariz. Provoca estornudos, goteo o congestin nasal, y la sensacin de que baja mucosidad por la parte trasera de la garganta(goteo posnasal). La rinitis alrgica puede ser de leve a grave. [employer] tipos de rinitis alrgica:  Psychologist, counselling. Este tipo tambin se denomina fiebre del heno. Sucede nicamente durante algunas estaciones.  Perenne. Este tipo puede ocurrir en cualquier momento del ao. Cules son las causas? Esta afeccin ocurre cuando el sistema de defensa del cuerpo(sistema inmunitario) reacciona a ciertas sustancias inofensivas llamadas alrgenos como si fueran grmenes.  La rinitis alrgica estacional se desencadena por el polen, que puede provenir del csped, los rboles y [place]. La rinitis alrgica perenne puede ser causada por:  Los caros del polvo en Engineer, mining.  La caspa de las [place].  Esporas del moho. Cules son los signos o los sntomas? Los sntomas de esta afeccin incluyen lo siguiente:  Estornudos.  Nariz tapada o que gotea (congestin nasal).  Goteo posnasal.  Escozor en la [place].  Ojos llorosos.  Dificultad para dormir.  [place]. Cmo se diagnostica? Esta afeccin se puede diagnosticar en funcin de lo siguiente:  Sus antecedentes mdicos.  Un examen fsico.  Estudios para detectar afecciones relacionadas, como las siguientes: ? Asma. ? Ojo rojo. ? Infeccin en los odos. ? Infeccin de las vas respiratorias superiores.  Estudios para Development worker, international aid sus sntomas. Por ejemplo, anlisis de sangre y cutneos. Cmo se trata? No hay cura para esta afeccin, pero el tratamiento puede ayudar a [employer] sntomas. El tratamiento puede incluir lo siguiente:  Tomar medicamentos que [employer] sntomas de la [place], [place] antihistamnicos.  Medicamentos que pueden administrarse por inyeccin, aerosol nasal o pldoras.  Evitar el alrgeno.  Desensibilizacin. Este tratamiento implica que se le apliquen inyecciones continuas hasta que su cuerpo se vuelva menos sensible al alrgeno. Este tratamiento se puede realizar si otros tratamientos no son eficaces.  Si tomar medicamentos y English as a second language teacher alrgeno no funciona, se pueden recetar nuevos medicamentos ms fuertes. Siga estas indicaciones en su casa:  Conozca a qu es Air cabin crew. Los alrgenos comunes incluyen el humo, polvo y [employer].  Evite las cosas a las cuales es alrgico. Hay medidas que puede tomar para ayudar a Product/process development scientist los alrgenos: ? [employer] alfombras por pisos de [place], baldosas o vinilo. Las alfombras pueden retener la caspa de los animales y [place]. ? No fume. No permita que fumen en su casa. ? Cambie el filtro de la calefaccin y del aire acondicionado al menos una vez al mes. ? Durante la temporada de alergias:  Mantenga las ventanas cerradas la mayor cantidad de [place] posible.  Planee actividades al aire libre cuando las concentraciones de polen estn en su nivel ms bajo. Normalmente, esto es [employer] de noche.  Cuando ingrese al interior, [place] de ropa y dese una ducha antes de sentarse en los muebles o la cama.  Tome los medicamentos de venta libre y los recetados solamente como se lo haya indicado el mdico.  Consulting civil engineer a todas las visitas de seguimiento como se lo haya indicado el mdico. Esto es importante. Comunquese con un mdico si:  Tiene fiebre.  Tiene tos persistente.  Comienza a emitir un sonido agudo al respirar (sibilancia).  Sus sntomas interfieren con sus actividades diarias normales. Solicite ayuda de inmediato si:  Le falta el aire. Resumen  Esta afeccin puede controlarse al tomar [employer] se le indique y al Management consultant.  Comunquese con su mdico si tiene tos persistente o fiebre.  Durante la  temporada de [place], [place] las ventanas cerradas la mayor cantidad de [place] posible. Esta informacin no tiene Marine scientist el consejo del mdico. Asegrese de hacerle al mdico cualquier pregunta que tenga. Document Revised: 03/23/2017 Document Reviewed: 03/23/2017 Elsevier Patient Education  [place].

## 2020-07-21 NOTE — Progress Notes (Signed)
Established Patient Office Visit  Subjective:  Patient ID: Julia Kerr, female    DOB: [birth year]  Age: 86 y.o. MRN: [id number]  CC:  Chief Complaint  Patient presents with  . Chronic Kidney Disease    HPI [facility] presents for chronic follow-up on anemia and CKD. Patient is doing well overall, her only complaint today is chronic rhinitis. Reports nasal spray given did not help and actually felt like it made her symptoms worse.  She has seen the allergist. She is using Vaseline with aloe which soothes her nose and also doing nasal rinses with saline. Denies cough, fever, or chills.  CKD: Reports good hydration and continues to avoid NSAIDs.   Anemia: Reports some fatigue but usually with strenuous activities.  History reviewed. No pertinent past medical history.  Past Surgical History:  Procedure Laterality Date  . SHOULDER SURGERY      History reviewed. No pertinent family history.  Social History   Socioeconomic History  . Marital status: Widowed    Spouse name: Not on file  . Number of children: Not on file  . Years of education: Not on file  . Highest education level: Not on file  Occupational History  . Not on file  Tobacco Use  . Smoking status: Never Smoker  . Smokeless tobacco: Never Used  Vaping Use  . Vaping Use: Never used  Substance and Sexual Activity  . Alcohol use: Never  . Drug use: Never  . Sexual activity: Not Currently  Other Topics Concern  . Not on file  Social History Narrative  . Not on file   Social Determinants of Health   Financial Resource Strain:   . Difficulty of Paying Living Expenses:   Food Insecurity:   . Worried About Charity fundraiser in the Last Year:   . Arboriculturist in the Last Year:   Transportation Needs:   . Film/video editor (Medical):   Marland Kitchen Lack of Transportation (Non-Medical):   Physical Activity:   . Days of Exercise per Week:   . Minutes of Exercise per Session:   Stress:   . Feeling of  Stress :   Social Connections:   . Frequency of Communication with Friends and Family:   . Frequency of Social Gatherings with Friends and Family:   . Attends Religious Services:   . Active Member of Clubs or Organizations:   . Attends Archivist Meetings:   Marland Kitchen Marital Status:   Intimate Partner Violence:   . Fear of Current or Ex-Partner:   . Emotionally Abused:   Marland Kitchen Physically Abused:   . Sexually Abused:     Outpatient Medications Prior to Visit  Medication Sig Dispense Refill  . Polyethyl Glycol-Propyl Glycol (SYSTANE FREE OP) Apply to eye.    . Azelastine HCl 0.15 % SOLN Place 1 spray into the nose 2 (two) times daily as needed (drainage). (Patient not taking: Reported on 07/21/2020) 30 mL 5   No facility-administered medications prior to visit.    Allergies  Allergen Reactions  . Food Other (See Comments)    Apples and strawberries cause burning of lips    ROS Review of Systems  A fourteen system review of systems was performed and found to be positive as per HPI.  Objective:    Physical Exam General:  Well Developed, appropriate for stated age, in no acute distress. Neuro:  Alert and oriented,  extra-ocular muscles intact  HEENT:  Normocephalic, atraumatic, right middle ear effusion,  boggy turbinates, neck supple Skin:  no gross rash, warm, pink. Cardiac:  RRR, S1 S2 Respiratory:  ECTA B/L and A/P, Not using accessory muscles, speaking in full sentences- unlabored. Vascular:  Ext warm, no cyanosis apprec.; cap RF less 2 sec. Psych:  No HI/SI, judgement and insight good, Euthymic mood. Full Affect.   BP (!) 147/75   Pulse 71   Temp 97.6 F (36.4 C) (Oral)   Ht 4' 8.3" (1.43 m)   Wt (!) 86 lb 14.4 oz (39.4 kg)   SpO2 100%   BMI 19.28 kg/m  Wt Readings from Last 3 Encounters:  07/21/20 (!) 86 lb 14.4 oz (39.4 kg)  02/06/20 87 lb 9.6 oz (39.7 kg)  08/08/19 84 lb 9.6 oz (38.4 kg)     Health Maintenance Due  Topic Date Due  . URINE MICROALBUMIN   Never done  . COVID-19 Vaccine (1) Never done  . TETANUS/TDAP  Never done  . DEXA SCAN  Never done  . PNA vac Low Risk Adult (1 of 2 - PCV13) Never done  . INFLUENZA VACCINE  07/20/2020    There are no preventive care reminders to display for this patient.  Lab Results  Component Value Date   TSH 0.219 (L) 12/25/2018   Lab Results  Component Value Date   WBC 3.8 12/25/2018   HGB 9.7 (L) 12/25/2018   HCT 29.9 (L) 12/25/2018   MCV 87 12/25/2018   PLT 274 12/25/2018   Lab Results  Component Value Date   NA 139 12/25/2018   K 4.3 12/25/2018   CO2 22 12/25/2018   GLUCOSE 76 12/25/2018   BUN 35 12/25/2018   CREATININE 1.19 (H) 12/25/2018   BILITOT 1.7 (H) 12/25/2018   ALKPHOS 73 12/25/2018   AST 25 12/25/2018   ALT 19 12/25/2018   PROT 7.1 12/25/2018   ALBUMIN 5.0 (H) 12/25/2018   CALCIUM 9.6 12/25/2018   Lab Results  Component Value Date   CHOL 82 (L) 08/04/2018   Lab Results  Component Value Date   HDL 31 (L) 08/04/2018   Lab Results  Component Value Date   LDLCALC 39 08/04/2018   Lab Results  Component Value Date   TRIG 59 08/04/2018   Lab Results  Component Value Date   CHOLHDL 2.6 08/04/2018   Lab Results  Component Value Date   HGBA1C 4.6 (L) 08/04/2018      Assessment & Plan:   Problem List Items Addressed This Visit      Respiratory   Chronic rhinitis   Relevant Medications   levocetirizine (XYZAL) 5 MG tablet     Genitourinary   CKD (chronic kidney disease), stage III   Relevant Orders   Comp Met (CMET)     Other   Vitamin D deficiency (Chronic)   Relevant Orders   Vitamin D (25 hydroxy)   B12 deficiency-requiring injections in past (Chronic)   Normocytic normochromic anemia - Primary   Relevant Orders   CBC w/Diff    Other Visit Diagnoses    Vitamin B12 deficiency       Relevant Orders   B12   Healthcare maintenance       Relevant Orders   Lipid Profile   CBC w/Diff   Comp Met (CMET)   TSH   HgB A1c   Vitamin D (25  hydroxy)   Middle ear effusion, right         Normocytic normochromic anemia: -Stable. -Last CBC: Hemoglobin 9.7, hematocrit 29.9 -Rechecking CBC today.  Chronic kidney disease: -Last CMP: Creatinine 1.19, GFR 39 -Continue to avoid nephrotoxic substances. -Rechecking CMP today.  Chronic rhinitis, right middle ear effusion: -Continue nasal rinses and Vaseline. -Start Xyzal 5 mg half a tablet every 3 days due to CKD.  Healthcare maintenance: -Placed additional lab orders. -Continue good hydration. -Follow up in 3 months   Meds ordered this encounter  Medications  . levocetirizine (XYZAL) 5 MG tablet    Sig: Take 0.5 tablet every 3 days.    Dispense:  30 tablet    Refill:  2    Order Specific Question:   Supervising Provider    Answer:   Beatrice Lecher D [[id number]]    Follow-up: Return in about 3 months (around 10/21/2020) for CKD, Chronic rhinitis.   Note:  This note was prepared with assistance of Dragon voice recognition software. Occasional wrong-word or sound-a-like substitutions may have occurred due to the inherent limitations of voice recognition software.   [name], PA-C

## 2020-07-22 ENCOUNTER — Telehealth: Payer: Self-pay | Admitting: Physician Assistant

## 2020-07-22 LAB — COMPREHENSIVE METABOLIC PANEL
ALT: 15 IU/L (ref 0–32)
AST: 28 IU/L (ref 0–40)
Albumin/Globulin Ratio: 2.7 — ABNORMAL HIGH (ref 1.2–2.2)
Albumin: 4.9 g/dL — ABNORMAL HIGH (ref 3.5–4.6)
Alkaline Phosphatase: 83 IU/L (ref 48–121)
BUN/Creatinine Ratio: 23 (ref 12–28)
BUN: 30 mg/dL (ref 10–36)
Bilirubin Total: 1 mg/dL (ref 0.0–1.2)
CO2: 23 mmol/L (ref 20–29)
Calcium: 9.3 mg/dL (ref 8.7–10.3)
Chloride: 106 mmol/L (ref 96–106)
Creatinine, Ser: 1.33 mg/dL — ABNORMAL HIGH (ref 0.57–1.00)
GFR calc Af Amer: 39 mL/min/{1.73_m2} — ABNORMAL LOW (ref >59)
GFR calc non Af Amer: 34 mL/min/{1.73_m2} — ABNORMAL LOW (ref >59)
Globulin, Total: 1.8 g/dL (ref 1.5–4.5)
Glucose: 81 mg/dL (ref 65–99)
Potassium: 4.6 mmol/L (ref 3.5–5.2)
Sodium: 143 mmol/L (ref 134–144)
Total Protein: 6.7 g/dL (ref 6.0–8.5)

## 2020-07-22 LAB — LIPID PANEL
Chol/HDL Ratio: 2.8 ratio (ref 0.0–4.4)
Cholesterol, Total: 78 mg/dL — ABNORMAL LOW (ref 100–199)
HDL: 28 mg/dL — ABNORMAL LOW (ref >39)
LDL Chol Calc (NIH): 33 mg/dL (ref 0–99)
Triglycerides: 82 mg/dL (ref 0–149)
VLDL Cholesterol Cal: 17 mg/dL (ref 5–40)

## 2020-07-22 LAB — CBC WITH DIFFERENTIAL/PLATELET
Basophils Absolute: 0 10*3/uL (ref 0.0–0.2)
Basos: 0 %
EOS (ABSOLUTE): 0 10*3/uL (ref 0.0–0.4)
Eos: 0 %
Hematocrit: 25.9 % — ABNORMAL LOW (ref 34.0–46.6)
Hemoglobin: 8.4 g/dL — ABNORMAL LOW (ref 11.1–15.9)
Immature Grans (Abs): 0.1 10*3/uL (ref 0.0–0.1)
Immature Granulocytes: 1 %
Lymphocytes Absolute: 1.2 10*3/uL (ref 0.7–3.1)
Lymphs: 19 %
MCH: 29.4 pg (ref 26.6–33.0)
MCHC: 32.4 g/dL (ref 31.5–35.7)
MCV: 91 fL (ref 79–97)
Monocytes Absolute: 1.2 10*3/uL — ABNORMAL HIGH (ref 0.1–0.9)
Monocytes: 19 %
Neutrophils Absolute: 3.9 10*3/uL (ref 1.4–7.0)
Neutrophils: 61 %
Platelets: 288 10*3/uL (ref 150–450)
RBC: 2.86 x10E6/uL — ABNORMAL LOW (ref 3.77–5.28)
RDW: 13.3 % (ref 11.7–15.4)
WBC: 6.4 10*3/uL (ref 3.4–10.8)

## 2020-07-22 LAB — VITAMIN B12: Vitamin B-12: 742 pg/mL (ref 232–1245)

## 2020-07-22 LAB — TSH: TSH: 0.341 u[IU]/mL — ABNORMAL LOW (ref 0.450–4.500)

## 2020-07-22 LAB — HEMOGLOBIN A1C
Est. average glucose Bld gHb Est-mCnc: 88 mg/dL
Hgb A1c MFr Bld: 4.7 % — ABNORMAL LOW (ref 4.8–5.6)

## 2020-07-22 LAB — VITAMIN D 25 HYDROXY (VIT D DEFICIENCY, FRACTURES): Vit D, 25-Hydroxy: 15.4 ng/mL — ABNORMAL LOW (ref 30.0–100.0)

## 2020-07-22 NOTE — Telephone Encounter (Signed)
-----   Message from [name], [place] sent at 07/22/2020 12:42 PM EDT ----- Please call [facility] and see if you can add free T4 and T3, and iron panel (iron, TIBC, ferritin).  Thank you, Herb Grays

## 2020-07-22 NOTE — Telephone Encounter (Signed)
Labs being added. AS, CMA

## 2020-07-23 LAB — IRON AND TIBC
Iron Saturation: 15 % (ref 15–55)
Iron: 38 ug/dL (ref 27–139)
Total Iron Binding Capacity: 255 ug/dL (ref 250–450)
UIBC: 217 ug/dL (ref 118–369)

## 2020-07-23 LAB — T4, FREE: Free T4: 1.55 ng/dL (ref 0.82–1.77)

## 2020-07-23 LAB — SPECIMEN STATUS REPORT

## 2020-07-23 LAB — T3: T3, Total: 128 ng/dL (ref 71–180)

## 2020-07-23 LAB — FERRITIN: Ferritin: 657 ng/mL — ABNORMAL HIGH (ref 15–150)

## 2020-07-24 ENCOUNTER — Telehealth: Payer: Self-pay | Admitting: Physician Assistant

## 2020-07-24 DIAGNOSIS — N183 Chronic kidney disease, stage 3 unspecified: Secondary | ICD-10-CM

## 2020-07-24 NOTE — Telephone Encounter (Signed)
-----   Message from [name], [place] sent at 07/24/2020  8:29 AM EDT ----- Please call Ms. Lorincz son York Cerise) and notify of lab results. Most labs were essentially within normal limits or stable from prior. Anemia has decreased and most likely related to decreased kidney function. Creatinine increased from 1.19 to 1.33 and GFR decreased from 39 to 34. Patient reports increasing water intake as advised last time so I recommend referral to Nephrologist for further evaluation. Vitamin D is low but with impaired kidney function will hold off on high dose of vitamin D supplement. Recommend taking OTC Vit D3 1000 units daily. I will repeat CMP, CBC and Vit D at next OV.   Thank you, Herb Grays

## 2020-08-01 ENCOUNTER — Ambulatory Visit
Admission: EM | Admit: 2020-08-01 | Discharge: 2020-08-01 | Disposition: A | Discharge status: Home / Self Care | Payer: Medicare Other

## 2020-08-01 ENCOUNTER — Other Ambulatory Visit: Payer: Self-pay

## 2020-08-01 DIAGNOSIS — M542 Cervicalgia: Secondary | ICD-10-CM

## 2020-08-01 NOTE — ED Provider Notes (Signed)
[facility]    CSN: [id number] Arrival date & time: 08/01/20  9381      History   Chief Complaint Chief Complaint  Patient presents with  . Neck Pain    HPI Julia Kerr is a 86 y.o. female presenting with her son for evaluation of neck soreness x3 days.  This is preceded by a long dental appointment in which new dentures were placed.  Denies trauma, fall, headache, fever.  No upper extremity numbness or weakness, chest pain, difficulty breathing.  Has tried Tylenol with some relief, he has been more helpful.    History reviewed. No pertinent past medical history.  Patient Active Problem List   Diagnosis Date Noted  . Chronic rhinitis 02/06/2020  . Adverse food reaction 02/06/2020  . Vaccine counseling 02/06/2020  . Cellulitis of lower extremity 08/18/2019  . Insect bite 08/01/2019  . Low serum thyroid stimulating hormone (TSH) 08/17/2018  . Normocytic normochromic anemia 08/17/2018  . CKD (chronic kidney disease), stage III 08/17/2018  . Dehydration, moderate 08/17/2018  . Low HDL (under 40) 08/17/2018  . Vitamin D deficiency 08/04/2018  . B12 deficiency-requiring injections in past 08/04/2018  . Bruit of right carotid artery 08/04/2018  . Choledocholithiasis with chronic cholecystitis- not amenable to surgery due to age 85/16/2019  . Biliary colic symptom- intolerant to dairy and fats due to gallstone presence 08/04/2018  . Environmental and seasonal allergies 08/04/2018  . Low weight for height- bmi 17.5 08/04/2018  . Postmenopausal bone loss 08/04/2018  . Chronic fatigue- with B12 def 08/04/2018    Past Surgical History:  Procedure Laterality Date  . SHOULDER SURGERY      OB History   No obstetric history on file.      Home Medications    Prior to Admission medications   Medication Sig Start Date End Date Taking? Authorizing Provider  levocetirizine (XYZAL) 5 MG tablet Take 0.5 tablet every 3 days. 07/21/20   [name], PA-C    Polyethyl Glycol-Propyl Glycol (SYSTANE FREE OP) Apply to eye.    [provider]    Family History History reviewed. No pertinent family history.  Social History Social History   Tobacco Use  . Smoking status: Never Smoker  . Smokeless tobacco: Never Used  Vaping Use  . Vaping Use: Never used  Substance Use Topics  . Alcohol use: Never  . Drug use: Never     Allergies   Food   Review of Systems As per HPI   Physical Exam Triage Vital Signs ED Triage Vitals  Enc Vitals Group     BP 08/01/20 0932 126/73     Pulse Rate 08/01/20 0932 75     Resp 08/01/20 0932 16     Temp 08/01/20 0932 98 F (36.7 C)     Temp src --      SpO2 08/01/20 0932 98 %     Weight --      Height --      Head Circumference --      Peak Flow --      Pain Score 08/01/20 0933 5     Pain Loc --      Pain Edu? --      Excl. in [place]? --    No data found.  Updated Vital Signs BP 126/73   Pulse 75   Temp 98 F (36.7 C)   Resp 16   SpO2 98%   Visual Acuity Right Eye Distance:   Left Eye Distance:  Bilateral Distance:    Right Eye Near:   Left Eye Near:    Bilateral Near:     Physical Exam Constitutional:      General: She is not in acute distress.    Appearance: She is not ill-appearing.  HENT:     Head: Normocephalic and atraumatic.     Right Ear: Tympanic membrane, ear canal and external ear normal.     Left Ear: Tympanic membrane, ear canal and external ear normal.     Mouth/Throat:     Mouth: Mucous membranes are moist.     Pharynx: Oropharynx is clear.  Eyes:     General: No scleral icterus.    Pupils: Pupils are equal, round, and reactive to light.  Neck:     Comments: Neck arch tenderness (R>L) Cardiovascular:     Rate and Rhythm: Normal rate.  Pulmonary:     Effort: Pulmonary effort is normal.  Musculoskeletal:     Cervical back: Tenderness present. No rigidity.  Skin:    Coloration: Skin is not jaundiced or pale.  Neurological:     Mental Status:  She is alert and oriented to person, place, and time.      UC Treatments / Results  Labs (all labs ordered are listed, but only abnormal results are displayed) Labs Reviewed - No data to display  EKG   Radiology No results found.  Procedures Procedures (including critical care time)  Medications Ordered in UC Medications - No data to display  Initial Impression / Assessment and Plan / UC Course  I have reviewed the triage vital signs and the nursing notes.  Pertinent labs & imaging results that were available during my care of the patient were reviewed by me and considered in my medical decision making (see chart for details).     No trauma, bony tenderness or deformity: Imaging deferred.  Likely MSK second to prolonged positioning.  Will treat supportively as outlined below.  Return precautions discussed, pt & son verbalized understanding and are agreeable to plan. Final Clinical Impressions(s) / UC Diagnoses   Final diagnoses:  Muscle pain, cervical     Discharge Instructions     Apply heat every 2-3 hours. May continue Tylenol. May see massage therapist, and perform stretches at home as discussed.    ED Prescriptions    None     PDMP not reviewed this encounter.   Neldon Mc [place], [place] 08/01/20 [phone]

## 2020-08-01 NOTE — ED Triage Notes (Signed)
Pt c/o neck soreness x 3 days. Has taken Tylenol with minor relief. Pt got new dentures on Monday

## 2020-08-01 NOTE — Discharge Instructions (Signed)
Apply heat every 2-3 hours. May continue Tylenol. May see massage therapist, and perform stretches at home as discussed.

## 2020-08-04 ENCOUNTER — Telehealth: Payer: Self-pay | Admitting: Physician Assistant

## 2020-08-04 DIAGNOSIS — M542 Cervicalgia: Secondary | ICD-10-CM

## 2020-08-04 DIAGNOSIS — M545 Low back pain, unspecified: Secondary | ICD-10-CM

## 2020-08-04 NOTE — Telephone Encounter (Signed)
Please review the below and advise. AS, CMA

## 2020-08-04 NOTE — Telephone Encounter (Signed)
Patient's son Roselyn Reef (DPR on file) called Friday for acute appt for his mom's ongoing back and neck pain. He is requesting an ortho referral for this now as UC did not help much and he thinks she needs to see a specialist.  Also, per son, patient cant take OTC pain meds and is wondering if there is anything she can be prescribed in the mean time for the pain until seen by ortho. If approved please send to [facility] on [address].

## 2020-08-04 NOTE — Telephone Encounter (Signed)
Pt's son advised of Maritza's recommendations.  Son states that pt cannot move her shoulder at all.  Advised son that if he feels that pt cannot wait for ortho appt, he may take her to [place].  Son provided with address and phone number.  Charyl Bigger, CMA

## 2020-08-04 NOTE — Telephone Encounter (Signed)
Due to patient's impaired kidney function options are limited and recommend to continue with Tylenol, heat or apply a topical anti-inflammatory and ok to place referral to Ortho. Patient might possible benefit from trigger point injections or other treatment deemed appropriate by specialist.  Thank you, Herb Grays

## 2020-08-04 NOTE — Addendum Note (Signed)
Addended by: Fonnie Mu on: 08/04/2020 02:37 PM   Modules accepted: Orders

## 2020-08-05 ENCOUNTER — Encounter: Payer: Self-pay | Admitting: Family

## 2020-08-05 ENCOUNTER — Ambulatory Visit: Payer: Self-pay

## 2020-08-05 ENCOUNTER — Ambulatory Visit: Payer: Medicare Other | Admitting: Family

## 2020-08-05 DIAGNOSIS — M542 Cervicalgia: Secondary | ICD-10-CM

## 2020-08-05 DIAGNOSIS — M25512 Pain in left shoulder: Secondary | ICD-10-CM | POA: Diagnosis not present

## 2020-08-05 MED ORDER — LIDOCAINE HCL 1 % IJ SOLN
5.0000 mL | INTRAMUSCULAR | Status: AC | PRN
  Administered 2020-08-05: 5 mL

## 2020-08-05 MED ORDER — METHOCARBAMOL 500 MG PO TABS
500.0000 mg | ORAL_TABLET | Freq: Three times a day (TID) | ORAL | 0 refills | Status: DC | PRN
Start: 2020-08-05 — End: 2020-12-03

## 2020-08-05 MED ORDER — METHYLPREDNISOLONE ACETATE 40 MG/ML IJ SUSP
40.0000 mg | INTRAMUSCULAR | Status: AC | PRN
  Administered 2020-08-05: 40 mg via INTRA_ARTICULAR

## 2020-08-05 NOTE — Progress Notes (Signed)
Office Visit Note   Patient: Julia Kerr           Date of Birth: 05/21/1923           MRN: [id number] Visit Date: 08/05/2020              Requested by: [name], PA-C [place],  [place] [id number] PCP: [name], PA-C  No chief complaint on file.     HPI: Patient is a 86 year old woman who presents today complaining of a 7-day history of bilateral neck pain.  This is been ongoing following a prolonged dental procedure she did obtain some new dentures.  She has been having soreness and pain as well as some stiffness in the neck this seems to be laterally does extend out to the left shoulder.  The stiffness has been gradually resolving she has normal range of motion today.  Continues with some aching.  She is having new pain in her left shoulder.  She is having anterior shoulder pain she is unable to lift her arm above her head reach across her chest to reach behind her back.  There is associated stiffness loss of range of motion she does have some aching pain that radiates from her neck to her shoulder but has not had any new injuries no falls not attempted to lift anything heavy.  Denies numbness tingling no radicular symptoms.  Assessment & Plan: Visit Diagnoses:  1. Neck pain   2. Acute pain of left shoulder     Plan: We will continue with conservative measures of the soft tissue and muscular pain she is having in her neck.  Offered muscle relaxer. depomedrol injection left shoulder, tolerated well. followup in 3-4 weeks as needed.   Follow-Up Instructions: No follow-ups on file.   Back Exam   Tenderness  The patient is experiencing no tenderness.   Range of Motion  The patient has normal back ROM.  Comments:  Pain with neck extension.  Negative spurling.   Left Shoulder Exam   Tenderness  The patient is experiencing tenderness in the biceps tendon.  Range of Motion  Passive abduction:  70 abnormal  External rotation: abnormal   Forward flexion: abnormal   Tests  Impingement: positive Drop arm: negative  Other  Erythema: absent Pulse: present       Patient is alert, oriented, no adenopathy, well-dressed, normal affect, normal respiratory effort.   Imaging: No results found. No images are attached to the encounter.  Labs: Lab Results  Component Value Date   HGBA1C 4.7 (L) 07/21/2020   HGBA1C 4.6 (L) 08/04/2018     Lab Results  Component Value Date   ALBUMIN 4.9 (H) 07/21/2020   ALBUMIN 5.0 (H) 12/25/2018   ALBUMIN 4.9 (H) 08/04/2018    No results found for: MG Lab Results  Component Value Date   VD25OH 15.4 (L) 07/21/2020   VD25OH 37.1 12/25/2018   VD25OH 28.2 (L) 08/04/2018    No results found for: PREALBUMIN CBC EXTENDED Latest Ref Rng & Units 07/21/2020 12/25/2018 08/04/2018  WBC 3.4 - 10.8 x10E3/uL 6.4 3.8 4.2  RBC 3.77 - 5.28 x10E6/uL 2.86(L) 3.42(L) 3.16(L)  HGB 11.1 - 15.9 g/dL 8.4(L) 9.7(L) 9.4(L)  HCT 34.0 - 46.6 % 25.9(L) 29.9(L) 28.1(L)  PLT 150 - 450 x10E3/uL 288 274 264  NEUTROABS 1 - 7 x10E3/uL 3.9 - 2.6  LYMPHSABS 0 - 3 x10E3/uL 1.2 - 0.8     There is no height or weight on  file to calculate BMI.  Orders:  Orders Placed This Encounter  Procedures  . XR Shoulder Left   No orders of the defined types were placed in this encounter.    Procedures: Large Joint Inj: L subacromial bursa on 08/05/2020 11:21 AM Indications: pain Details: 22 G 1.5 in needle Medications: 5 mL lidocaine 1 %; 40 mg methylPREDNISolone acetate 40 MG/ML Consent was given by the patient.      Clinical Data: No additional findings.  ROS:  All other systems negative, except as noted in the HPI. Review of Systems  Constitutional: Negative for chills and fever.  Musculoskeletal: Positive for arthralgias, myalgias, neck pain and neck stiffness.  Neurological: Negative for weakness and numbness.    Objective: Vital Signs: There were no vitals taken for this visit.  Specialty  Comments:  No specialty comments available.  PMFS History: Patient Active Problem List   Diagnosis Date Noted  . Chronic rhinitis 02/06/2020  . Adverse food reaction 02/06/2020  . Vaccine counseling 02/06/2020  . Cellulitis of lower extremity 08/18/2019  . Insect bite 08/01/2019  . Low serum thyroid stimulating hormone (TSH) 08/17/2018  . Normocytic normochromic anemia 08/17/2018  . CKD (chronic kidney disease), stage III 08/17/2018  . Dehydration, moderate 08/17/2018  . Low HDL (under 40) 08/17/2018  . Vitamin D deficiency 08/04/2018  . B12 deficiency-requiring injections in past 08/04/2018  . Bruit of right carotid artery 08/04/2018  . Choledocholithiasis with chronic cholecystitis- not amenable to surgery due to age 31/16/2019  . Biliary colic symptom- intolerant to dairy and fats due to gallstone presence 08/04/2018  . Environmental and seasonal allergies 08/04/2018  . Low weight for height- bmi 17.5 08/04/2018  . Postmenopausal bone loss 08/04/2018  . Chronic fatigue- with B12 def 08/04/2018   History reviewed. No pertinent past medical history.  History reviewed. No pertinent family history.  Past Surgical History:  Procedure Laterality Date  . SHOULDER SURGERY     Social History   Occupational History  . Not on file  Tobacco Use  . Smoking status: Never Smoker  . Smokeless tobacco: Never Used  Vaping Use  . Vaping Use: Never used  Substance and Sexual Activity  . Alcohol use: Never  . Drug use: Never  . Sexual activity: Not Currently

## 2020-08-26 ENCOUNTER — Ambulatory Visit: Payer: Medicare Other | Admitting: Physician Assistant

## 2020-09-03 DIAGNOSIS — N189 Chronic kidney disease, unspecified: Secondary | ICD-10-CM | POA: Diagnosis not present

## 2020-09-03 DIAGNOSIS — D631 Anemia in chronic kidney disease: Secondary | ICD-10-CM | POA: Diagnosis not present

## 2020-09-03 DIAGNOSIS — K8044 Calculus of bile duct with chronic cholecystitis without obstruction: Secondary | ICD-10-CM | POA: Diagnosis not present

## 2020-09-03 DIAGNOSIS — N1832 Chronic kidney disease, stage 3b: Secondary | ICD-10-CM | POA: Diagnosis not present

## 2020-09-03 DIAGNOSIS — E559 Vitamin D deficiency, unspecified: Secondary | ICD-10-CM | POA: Diagnosis not present

## 2020-09-03 DIAGNOSIS — E46 Unspecified protein-calorie malnutrition: Secondary | ICD-10-CM | POA: Diagnosis not present

## 2020-09-04 ENCOUNTER — Other Ambulatory Visit: Payer: Self-pay | Admitting: Nephrology

## 2020-09-04 ENCOUNTER — Telehealth: Payer: Self-pay | Admitting: Physician Assistant

## 2020-09-04 DIAGNOSIS — N1832 Chronic kidney disease, stage 3b: Secondary | ICD-10-CM

## 2020-09-04 DIAGNOSIS — K8050 Calculus of bile duct without cholangitis or cholecystitis without obstruction: Secondary | ICD-10-CM

## 2020-09-04 NOTE — Telephone Encounter (Signed)
Referral has been placed. AS, CMA 

## 2020-09-04 NOTE — Telephone Encounter (Signed)
Patient's son called into the office to get referral put in for his mother to see a Copywriter, advertising.  She is having gall bladder issues.

## 2020-09-10 ENCOUNTER — Other Ambulatory Visit: Payer: Medicare Other

## 2020-09-10 ENCOUNTER — Ambulatory Visit
Admission: RE | Admit: 2020-09-10 | Discharge: 2020-09-10 | Disposition: A | Discharge status: Home / Self Care | Payer: Medicare Other | Source: Ambulatory Visit | Attending: Nephrology | Admitting: Nephrology

## 2020-09-10 DIAGNOSIS — N281 Cyst of kidney, acquired: Secondary | ICD-10-CM | POA: Diagnosis not present

## 2020-09-10 DIAGNOSIS — N1832 Chronic kidney disease, stage 3b: Secondary | ICD-10-CM

## 2020-09-22 ENCOUNTER — Other Ambulatory Visit (HOSPITAL_COMMUNITY): Payer: Self-pay | Admitting: *Deleted

## 2020-09-22 NOTE — Discharge Instructions (Signed)
Ferumoxytol injection What is this medicine? FERUMOXYTOL is an iron complex. Iron is used to make healthy red blood cells, which carry oxygen and nutrients throughout the body. This medicine is used to treat iron deficiency anemia. This medicine may be used for other purposes; ask your health care provider or pharmacist if you have questions. COMMON BRAND NAME(S): Feraheme What should I tell my health care provider before I take this medicine? They need to know if you have any of these conditions:  anemia not caused by low iron levels  high levels of iron in the blood  magnetic resonance imaging (MRI) test scheduled  an unusual or allergic reaction to iron, other medicines, foods, dyes, or preservatives  pregnant or trying to get pregnant  breast-feeding How should I use this medicine? This medicine is for injection into a vein. It is given by a health care professional in a hospital or clinic setting. Talk to your pediatrician regarding the use of this medicine in children. Special care may be needed. Overdosage: If you think you have taken too much of this medicine contact a poison control center or emergency room at once. NOTE: This medicine is only for you. Do not share this medicine with others. What if I miss a dose? It is important not to miss your dose. Call your doctor or health care professional if you are unable to keep an appointment. What may interact with this medicine? This medicine may interact with the following medications:  other iron products This list may not describe all possible interactions. Give your health care provider a list of all the medicines, herbs, non-prescription drugs, or dietary supplements you use. Also tell them if you smoke, drink alcohol, or use illegal drugs. Some items may interact with your medicine. What should I watch for while using this medicine? Visit your doctor or healthcare professional regularly. Tell your doctor or healthcare  professional if your symptoms do not start to get better or if they get worse. You may need blood work done while you are taking this medicine. You may need to follow a special diet. Talk to your doctor. Foods that contain iron include: whole grains/cereals, dried fruits, beans, or peas, leafy green vegetables, and organ meats (liver, kidney). What side effects may I notice from receiving this medicine? Side effects that you should report to your doctor or health care professional as soon as possible:  allergic reactions like skin rash, itching or hives, swelling of the face, lips, or tongue  breathing problems  changes in blood pressure  feeling faint or lightheaded, falls  fever or chills  flushing, sweating, or hot feelings  swelling of the ankles or feet Side effects that usually do not require medical attention (report to your doctor or health care professional if they continue or are bothersome):  diarrhea  headache  nausea, vomiting  stomach pain This list may not describe all possible side effects. Call your doctor for medical advice about side effects. You may report side effects to FDA at 1-800-FDA-1088. Where should I keep my medicine? This drug is given in a hospital or clinic and will not be stored at home. NOTE: This sheet is a summary. It may not cover all possible information. If you have questions about this medicine, talk to your doctor, pharmacist, or health care provider.  2020 Elsevier/Gold Standard (2017-01-24 20:21:10) Epoetin Alfa injection What is this medicine? EPOETIN ALFA (e POE e tin AL fa) helps your body make more red blood cells. This   medicine is used to treat anemia caused by chronic kidney disease, cancer chemotherapy, or HIV-therapy. It may also be used before surgery if you have anemia. This medicine may be used for other purposes; ask your health care provider or pharmacist if you have questions. COMMON BRAND NAME(S): Epogen, Procrit,  Retacrit What should I tell my health care provider before I take this medicine? They need to know if you have any of these conditions:  cancer  heart disease  high blood pressure  history of blood clots  history of stroke  low levels of folate, iron, or vitamin B12 in the blood  seizures  an unusual or allergic reaction to erythropoietin, albumin, benzyl alcohol, hamster proteins, other medicines, foods, dyes, or preservatives  pregnant or trying to get pregnant  breast-feeding How should I use this medicine? This medicine is for injection into a vein or under the skin. It is usually given by a health care professional in a hospital or clinic setting. If you get this medicine at home, you will be taught how to prepare and give this medicine. Use exactly as directed. Take your medicine at regular intervals. Do not take your medicine more often than directed. It is important that you put your used needles and syringes in a special sharps container. Do not put them in a trash can. If you do not have a sharps container, call your pharmacist or healthcare provider to get one. A special MedGuide will be given to you by the pharmacist with each prescription and refill. Be sure to read this information carefully each time. Talk to your pediatrician regarding the use of this medicine in children. While this drug may be prescribed for selected conditions, precautions do apply. Overdosage: If you think you have taken too much of this medicine contact a poison control center or emergency room at once. NOTE: This medicine is only for you. Do not share this medicine with others. What if I miss a dose? If you miss a dose, take it as soon as you can. If it is almost time for your next dose, take only that dose. Do not take double or extra doses. What may interact with this medicine? Interactions have not been studied. This list may not describe all possible interactions. Give your health care  provider a list of all the medicines, herbs, non-prescription drugs, or dietary supplements you use. Also tell them if you smoke, drink alcohol, or use illegal drugs. Some items may interact with your medicine. What should I watch for while using this medicine? Your condition will be monitored carefully while you are receiving this medicine. You may need blood work done while you are taking this medicine. This medicine may cause a decrease in vitamin B6. You should make sure that you get enough vitamin B6 while you are taking this medicine. Discuss the foods you eat and the vitamins you take with your health care professional. What side effects may I notice from receiving this medicine? Side effects that you should report to your doctor or health care professional as soon as possible:  allergic reactions like skin rash, itching or hives, swelling of the face, lips, or tongue  seizures  signs and symptoms of a blood clot such as breathing problems; changes in vision; chest pain; severe, sudden headache; pain, swelling, warmth in the leg; trouble speaking; sudden numbness or weakness of the face, arm or leg  signs and symptoms of a stroke like changes in vision; confusion; trouble speaking or understanding;   severe headaches; sudden numbness or weakness of the face, arm or leg; trouble walking; dizziness; loss of balance or coordination Side effects that usually do not require medical attention (report to your doctor or health care professional if they continue or are bothersome):  chills  cough  dizziness  fever  headaches  joint pain  muscle cramps  muscle pain  nausea, vomiting  pain, redness, or irritation at site where injected This list may not describe all possible side effects. Call your doctor for medical advice about side effects. You may report side effects to FDA at 1-800-FDA-1088. Where should I keep my medicine? Keep out of the reach of children. Store in a  refrigerator between 2 and 8 degrees C (36 and 46 degrees F). Do not freeze or shake. Throw away any unused portion if using a single-dose vial. Multi-dose vials can be kept in the refrigerator for up to 21 days after the initial dose. Throw away unused medicine. NOTE: This sheet is a summary. It may not cover all possible information. If you have questions about this medicine, talk to your doctor, pharmacist, or health care provider.  2020 Elsevier/Gold Standard (2017-07-15 08:35:19)  

## 2020-09-23 ENCOUNTER — Encounter (HOSPITAL_COMMUNITY)
Admission: RE | Admit: 2020-09-23 | Discharge: 2020-09-23 | Disposition: A | Discharge status: Home / Self Care | Payer: Medicare Other | Source: Ambulatory Visit | Attending: Nephrology | Admitting: Nephrology

## 2020-09-23 ENCOUNTER — Other Ambulatory Visit: Payer: Self-pay

## 2020-09-23 VITALS — BP 132/55 | HR 64 | Temp 97.5°F | Resp 20

## 2020-09-23 DIAGNOSIS — N183 Chronic kidney disease, stage 3 unspecified: Secondary | ICD-10-CM | POA: Diagnosis not present

## 2020-09-23 LAB — POCT HEMOGLOBIN-HEMACUE: Hemoglobin: 8.1 g/dL — ABNORMAL LOW (ref 12.0–15.0)

## 2020-09-23 MED ORDER — EPOETIN ALFA-EPBX 10000 UNIT/ML IJ SOLN: 10000.0000 [IU] | INTRAMUSCULAR | Status: DC

## 2020-09-23 MED ORDER — EPOETIN ALFA-EPBX 10000 UNIT/ML IJ SOLN
INTRAMUSCULAR | Status: AC
  Administered 2020-09-23: 10000 [IU] via SUBCUTANEOUS
  Filled 2020-09-23: qty 1

## 2020-09-23 MED ORDER — SODIUM CHLORIDE 0.9 % IV SOLN
510.0000 mg | INTRAVENOUS | Status: DC
  Administered 2020-09-23: 510 mg via INTRAVENOUS
  Filled 2020-09-23: qty 17

## 2020-10-07 ENCOUNTER — Encounter (HOSPITAL_COMMUNITY): Payer: Medicare Other

## 2020-10-08 ENCOUNTER — Other Ambulatory Visit: Payer: Self-pay

## 2020-10-08 ENCOUNTER — Encounter (HOSPITAL_COMMUNITY)
Admission: RE | Admit: 2020-10-08 | Discharge: 2020-10-08 | Disposition: A | Discharge status: Home / Self Care | Payer: Medicare Other | Source: Ambulatory Visit | Attending: Nephrology | Admitting: Nephrology

## 2020-10-08 VITALS — BP 117/55 | HR 67 | Temp 97.5°F | Resp 20

## 2020-10-08 DIAGNOSIS — N183 Chronic kidney disease, stage 3 unspecified: Secondary | ICD-10-CM

## 2020-10-08 LAB — POCT HEMOGLOBIN-HEMACUE: Hemoglobin: 8.6 g/dL — ABNORMAL LOW (ref 12.0–15.0)

## 2020-10-08 MED ORDER — EPOETIN ALFA-EPBX 10000 UNIT/ML IJ SOLN
INTRAMUSCULAR | Status: AC
  Administered 2020-10-08: 10000 [IU] via SUBCUTANEOUS
  Filled 2020-10-08: qty 1

## 2020-10-08 MED ORDER — EPOETIN ALFA-EPBX 10000 UNIT/ML IJ SOLN: 10000.0000 [IU] | INTRAMUSCULAR | Status: DC

## 2020-10-08 MED ORDER — SODIUM CHLORIDE 0.9 % IV SOLN
510.0000 mg | INTRAVENOUS | Status: AC
  Administered 2020-10-08: 510 mg via INTRAVENOUS
  Filled 2020-10-08: qty 510

## 2020-10-14 ENCOUNTER — Encounter: Payer: Self-pay | Admitting: Gastroenterology

## 2020-10-22 ENCOUNTER — Encounter (HOSPITAL_COMMUNITY)
Admission: RE | Admit: 2020-10-22 | Discharge: 2020-10-22 | Disposition: A | Discharge status: Home / Self Care | Payer: Medicare Other | Source: Ambulatory Visit | Attending: Nephrology | Admitting: Nephrology

## 2020-10-22 ENCOUNTER — Other Ambulatory Visit: Payer: Self-pay

## 2020-10-22 VITALS — BP 130/64 | HR 73 | Temp 97.3°F | Resp 20

## 2020-10-22 DIAGNOSIS — D631 Anemia in chronic kidney disease: Secondary | ICD-10-CM | POA: Diagnosis not present

## 2020-10-22 DIAGNOSIS — N183 Chronic kidney disease, stage 3 unspecified: Secondary | ICD-10-CM | POA: Diagnosis not present

## 2020-10-22 LAB — POCT HEMOGLOBIN-HEMACUE: Hemoglobin: 10.7 g/dL — ABNORMAL LOW (ref 12.0–15.0)

## 2020-10-22 MED ORDER — EPOETIN ALFA-EPBX 10000 UNIT/ML IJ SOLN: 10000.0000 [IU] | INTRAMUSCULAR | Status: DC

## 2020-10-22 MED ORDER — EPOETIN ALFA-EPBX 10000 UNIT/ML IJ SOLN
INTRAMUSCULAR | Status: AC
  Administered 2020-10-22: 10000 [IU] via SUBCUTANEOUS
  Filled 2020-10-22: qty 1

## 2020-11-05 ENCOUNTER — Encounter (HOSPITAL_COMMUNITY)
Admission: RE | Admit: 2020-11-05 | Discharge: 2020-11-05 | Disposition: A | Discharge status: Home / Self Care | Payer: Medicare Other | Source: Ambulatory Visit | Attending: Nephrology | Admitting: Nephrology

## 2020-11-05 ENCOUNTER — Other Ambulatory Visit: Payer: Self-pay

## 2020-11-05 VITALS — BP 135/66 | HR 78

## 2020-11-05 DIAGNOSIS — N183 Chronic kidney disease, stage 3 unspecified: Secondary | ICD-10-CM

## 2020-11-05 DIAGNOSIS — D631 Anemia in chronic kidney disease: Secondary | ICD-10-CM | POA: Diagnosis not present

## 2020-11-05 LAB — IRON AND TIBC
Iron: 91 ug/dL (ref 28–170)
Saturation Ratios: 37 % — ABNORMAL HIGH (ref 10.4–31.8)
TIBC: 248 ug/dL — ABNORMAL LOW (ref 250–450)
UIBC: 157 ug/dL

## 2020-11-05 LAB — FERRITIN: Ferritin: 971 ng/mL — ABNORMAL HIGH (ref 11–307)

## 2020-11-05 LAB — POCT HEMOGLOBIN-HEMACUE: Hemoglobin: 10.3 g/dL — ABNORMAL LOW (ref 12.0–15.0)

## 2020-11-05 MED ORDER — EPOETIN ALFA-EPBX 10000 UNIT/ML IJ SOLN
10000.0000 [IU] | INTRAMUSCULAR | Status: DC
  Administered 2020-11-05: 10000 [IU] via SUBCUTANEOUS

## 2020-11-05 MED ORDER — EPOETIN ALFA-EPBX 10000 UNIT/ML IJ SOLN
INTRAMUSCULAR | Status: AC
  Filled 2020-11-05: qty 1

## 2020-11-19 ENCOUNTER — Ambulatory Visit: Payer: Medicare Other | Admitting: Podiatry

## 2020-11-19 ENCOUNTER — Encounter (HOSPITAL_COMMUNITY): Payer: Medicare Other

## 2020-11-21 ENCOUNTER — Encounter (HOSPITAL_COMMUNITY)
Admission: RE | Admit: 2020-11-21 | Discharge: 2020-11-21 | Disposition: A | Discharge status: Home / Self Care | Payer: Medicare Other | Source: Ambulatory Visit | Attending: Nephrology | Admitting: Nephrology

## 2020-11-21 ENCOUNTER — Other Ambulatory Visit: Payer: Self-pay

## 2020-11-21 VITALS — BP 120/60 | HR 74 | Temp 97.4°F | Resp 20

## 2020-11-21 DIAGNOSIS — D631 Anemia in chronic kidney disease: Secondary | ICD-10-CM | POA: Diagnosis not present

## 2020-11-21 DIAGNOSIS — N183 Chronic kidney disease, stage 3 unspecified: Secondary | ICD-10-CM | POA: Diagnosis not present

## 2020-11-21 LAB — POCT HEMOGLOBIN-HEMACUE: Hemoglobin: 10.6 g/dL — ABNORMAL LOW (ref 12.0–15.0)

## 2020-11-21 MED ORDER — EPOETIN ALFA-EPBX 10000 UNIT/ML IJ SOLN
INTRAMUSCULAR | Status: AC
  Filled 2020-11-21: qty 1

## 2020-11-21 MED ORDER — EPOETIN ALFA-EPBX 10000 UNIT/ML IJ SOLN
10000.0000 [IU] | INTRAMUSCULAR | Status: DC
  Administered 2020-11-21: 10000 [IU] via SUBCUTANEOUS

## 2020-11-26 ENCOUNTER — Ambulatory Visit: Payer: Medicare Other | Admitting: Podiatry

## 2020-12-03 ENCOUNTER — Other Ambulatory Visit (INDEPENDENT_AMBULATORY_CARE_PROVIDER_SITE_OTHER): Payer: Medicare Other

## 2020-12-03 ENCOUNTER — Ambulatory Visit: Payer: Medicare Other | Admitting: Gastroenterology

## 2020-12-03 ENCOUNTER — Other Ambulatory Visit: Payer: Medicare Other

## 2020-12-03 ENCOUNTER — Encounter: Payer: Self-pay | Admitting: Gastroenterology

## 2020-12-03 VITALS — BP 120/60 | HR 80 | Ht <= 58 in | Wt 82.0 lb

## 2020-12-03 DIAGNOSIS — R1011 Right upper quadrant pain: Secondary | ICD-10-CM

## 2020-12-03 LAB — COMPREHENSIVE METABOLIC PANEL
ALT: 16 U/L (ref 0–35)
AST: 23 U/L (ref 0–37)
Albumin: 4.6 g/dL (ref 3.5–5.2)
Alkaline Phosphatase: 83 U/L (ref 39–117)
BUN: 36 mg/dL — ABNORMAL HIGH (ref 6–23)
CO2: 22 mEq/L (ref 19–32)
Calcium: 9.2 mg/dL (ref 8.4–10.5)
Chloride: 104 mEq/L (ref 96–112)
Creatinine, Ser: 1.14 mg/dL (ref 0.40–1.20)
GFR: 40.98 mL/min — ABNORMAL LOW (ref >60.00)
Glucose, Bld: 120 mg/dL — ABNORMAL HIGH (ref 70–99)
Potassium: 4.4 mEq/L (ref 3.5–5.1)
Sodium: 137 mEq/L (ref 135–145)
Total Bilirubin: 1.4 mg/dL — ABNORMAL HIGH (ref 0.2–1.2)
Total Protein: 7.1 g/dL (ref 6.0–8.3)

## 2020-12-03 LAB — CBC
HCT: 31.7 % — ABNORMAL LOW (ref 36.0–46.0)
Hemoglobin: 10.6 g/dL — ABNORMAL LOW (ref 12.0–15.0)
MCHC: 33.3 g/dL (ref 30.0–36.0)
MCV: 87.6 fl (ref 78.0–100.0)
Platelets: 211 10*3/uL (ref 150.0–400.0)
RBC: 3.62 Mil/uL — ABNORMAL LOW (ref 3.87–5.11)
RDW: 16.6 % — ABNORMAL HIGH (ref 11.5–15.5)
WBC: 8 10*3/uL (ref 4.0–10.5)

## 2020-12-03 NOTE — Patient Instructions (Addendum)
If you are age 84 or older, your body mass index should be between 23-30. Your Body mass index is 18.38 kg/m. If this is out of the aforementioned range listed, please consider follow up with your Primary Care Provider.  Your provider has requested that you go to the basement level for lab work before leaving today. Press "B" on the elevator. The lab is located at the first door on the left as you exit the elevator.  You have been scheduled for an abdominal ultrasound at [facility] (1st floor of hospital) on 12-23-21at 9am. Please arrive 30 minutes prior to your appointment for registration. Make certain not to have anything to eat or drink after midnight prior to your appointment. Should you need to reschedule your appointment, please contact radiology at [phone]. This test typically takes about 30 minutes to perform.  Due to recent changes in healthcare laws, you may see the results of your imaging and laboratory studies on MyChart before your provider has had a chance to review them.  We understand that in some cases there may be results that are confusing or concerning to you. Not all laboratory results come back in the same time frame and the provider may be waiting for multiple results in order to interpret others.  Please give [place] 48 hours in order for your provider to thoroughly review all the results before contacting the office for clarification of your results.   Thank you for entrusting me with your care and choosing [facility].  Dr Ardis Hughs
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
## 2020-12-05 ENCOUNTER — Other Ambulatory Visit: Payer: Medicare Other

## 2020-12-05 ENCOUNTER — Ambulatory Visit: Payer: Medicare Other | Admitting: Podiatry

## 2020-12-05 ENCOUNTER — Other Ambulatory Visit: Payer: Self-pay

## 2020-12-05 ENCOUNTER — Encounter (HOSPITAL_COMMUNITY)
Admission: RE | Admit: 2020-12-05 | Discharge: 2020-12-05 | Disposition: A | Discharge status: Home / Self Care | Payer: Medicare Other | Source: Ambulatory Visit | Attending: Nephrology | Admitting: Nephrology

## 2020-12-05 ENCOUNTER — Encounter: Payer: Self-pay | Admitting: Podiatry

## 2020-12-05 VITALS — BP 119/63 | HR 72 | Temp 98.4°F

## 2020-12-05 VITALS — BP 138/50 | HR 71 | Temp 97.4°F | Resp 20

## 2020-12-05 DIAGNOSIS — R1011 Right upper quadrant pain: Secondary | ICD-10-CM

## 2020-12-05 DIAGNOSIS — B351 Tinea unguium: Secondary | ICD-10-CM | POA: Diagnosis not present

## 2020-12-05 DIAGNOSIS — M79675 Pain in left toe(s): Secondary | ICD-10-CM

## 2020-12-05 DIAGNOSIS — D631 Anemia in chronic kidney disease: Secondary | ICD-10-CM | POA: Diagnosis not present

## 2020-12-05 DIAGNOSIS — N183 Chronic kidney disease, stage 3 unspecified: Secondary | ICD-10-CM

## 2020-12-05 DIAGNOSIS — M79674 Pain in right toe(s): Secondary | ICD-10-CM

## 2020-12-05 LAB — IRON AND TIBC
Iron: 80 ug/dL (ref 28–170)
Saturation Ratios: 33 % — ABNORMAL HIGH (ref 10.4–31.8)
TIBC: 245 ug/dL — ABNORMAL LOW (ref 250–450)
UIBC: 165 ug/dL

## 2020-12-05 LAB — FERRITIN: Ferritin: 1141 ng/mL — ABNORMAL HIGH (ref 11–307)

## 2020-12-05 MED ORDER — EPOETIN ALFA-EPBX 10000 UNIT/ML IJ SOLN
10000.0000 [IU] | INTRAMUSCULAR | Status: DC
  Administered 2020-12-05: 09:00:00 10000 [IU] via SUBCUTANEOUS

## 2020-12-05 MED ORDER — EPOETIN ALFA-EPBX 10000 UNIT/ML IJ SOLN
INTRAMUSCULAR | Status: AC
  Filled 2020-12-05: qty 1

## 2020-12-05 NOTE — Progress Notes (Signed)
  Subjective:  Patient ID: Julia Kerr, female    DOB: 1923/01/28,  MRN: [id number]  Chief Complaint  Patient presents with  . Nail Problem    Nail fungal growth possible    86 y.o. female returns for the above complaint.  Patient presents with thickened elongated dystrophic toenails x10 are there.  They are painful to touch.  Patient would like to have them debrided down.  She has not seen anyone else prior to seeing me.  She denies any other acute complaints.  She is not a diabetic.  Objective:   Vitals:   12/05/20 1035  BP: 119/63  Pulse: 72  Temp: 98.4 F (36.9 C)   Podiatric Exam: Vascular: dorsalis pedis and posterior tibial pulses are palpable bilateral. Capillary return is immediate. Temperature gradient is WNL. Skin turgor WNL  Sensorium: Normal Semmes Weinstein monofilament test. Normal tactile sensation bilaterally. Nail Exam: Pt has thick disfigured discolored nails with subungual debris noted bilateral entire nail hallux through fifth toenails.  Pain on palpation to the nails. Ulcer Exam: There is no evidence of ulcer or pre-ulcerative changes or infection. Orthopedic Exam: Muscle tone and strength are WNL. No limitations in general ROM. No crepitus or effusions noted. HAV  B/L.  Hammer toes 2-5  B/L. Skin: No Porokeratosis. No infection or ulcers    Assessment & Plan:   1. Pain due to onychomycosis of toenails of both feet     Patient was evaluated and treated and all questions answered.  Onychomycosis with pain  -Nails palliatively debrided as below. -Educated on self-care  Procedure: Nail Debridement Rationale: pain  Type of Debridement: manual, sharp debridement. Instrumentation: Nail nipper, rotary burr. Number of Nails: 10  Procedures and Treatment: Consent by patient was obtained for treatment procedures. The patient understood the discussion of treatment and procedures well. All questions were answered thoroughly reviewed. Debridement of mycotic and  hypertrophic toenails, 1 through 5 bilateral and clearing of subungual debris. No ulceration, no infection noted.  Return Visit-Office Procedure: Patient instructed to return to the office for a follow up visit 3 months for continued evaluation and treatment.  Boneta Lucks, DPM    No follow-ups on file.

## 2020-12-08 LAB — H. PYLORI ANTIGEN, STOOL: H pylori Ag, Stl: NEGATIVE

## 2020-12-08 LAB — POCT HEMOGLOBIN-HEMACUE: Hemoglobin: 10.7 g/dL — ABNORMAL LOW (ref 12.0–15.0)

## 2020-12-11 ENCOUNTER — Ambulatory Visit (HOSPITAL_COMMUNITY)
Admission: RE | Admit: 2020-12-11 | Discharge: 2020-12-11 | Disposition: A | Discharge status: Home / Self Care | Payer: Medicare Other | Source: Ambulatory Visit | Attending: Gastroenterology | Admitting: Gastroenterology

## 2020-12-11 ENCOUNTER — Other Ambulatory Visit: Payer: Self-pay

## 2020-12-11 DIAGNOSIS — K8020 Calculus of gallbladder without cholecystitis without obstruction: Secondary | ICD-10-CM | POA: Diagnosis not present

## 2020-12-11 DIAGNOSIS — R1011 Right upper quadrant pain: Secondary | ICD-10-CM | POA: Insufficient documentation

## 2020-12-11 DIAGNOSIS — K7689 Other specified diseases of liver: Secondary | ICD-10-CM | POA: Diagnosis not present

## 2020-12-18 ENCOUNTER — Encounter (HOSPITAL_COMMUNITY)
Admission: RE | Admit: 2020-12-18 | Discharge: 2020-12-18 | Disposition: A | Discharge status: Home / Self Care | Payer: Medicare Other | Source: Ambulatory Visit | Attending: Nephrology | Admitting: Nephrology

## 2020-12-18 ENCOUNTER — Other Ambulatory Visit: Payer: Self-pay

## 2020-12-18 VITALS — BP 125/69 | HR 73 | Temp 98.4°F | Resp 20

## 2020-12-18 DIAGNOSIS — N183 Chronic kidney disease, stage 3 unspecified: Secondary | ICD-10-CM | POA: Diagnosis not present

## 2020-12-18 DIAGNOSIS — D631 Anemia in chronic kidney disease: Secondary | ICD-10-CM | POA: Diagnosis not present

## 2020-12-18 LAB — POCT HEMOGLOBIN-HEMACUE: Hemoglobin: 11 g/dL — ABNORMAL LOW (ref 12.0–15.0)

## 2020-12-18 MED ORDER — EPOETIN ALFA-EPBX 10000 UNIT/ML IJ SOLN: 10000.0000 [IU] | INTRAMUSCULAR | Status: DC

## 2020-12-18 MED ORDER — EPOETIN ALFA-EPBX 10000 UNIT/ML IJ SOLN
INTRAMUSCULAR | Status: AC
  Administered 2020-12-18: 09:00:00 10000 [IU] via SUBCUTANEOUS
  Filled 2020-12-18: qty 1

## 2021-01-01 ENCOUNTER — Encounter (HOSPITAL_COMMUNITY)
Admission: RE | Admit: 2021-01-01 | Discharge: 2021-01-01 | Disposition: A | Discharge status: Home / Self Care | Payer: Medicare Other | Source: Ambulatory Visit | Attending: Nephrology | Admitting: Nephrology

## 2021-01-01 ENCOUNTER — Other Ambulatory Visit: Payer: Self-pay

## 2021-01-01 VITALS — BP 147/61 | HR 66 | Temp 97.3°F | Resp 20

## 2021-01-01 DIAGNOSIS — D631 Anemia in chronic kidney disease: Secondary | ICD-10-CM | POA: Insufficient documentation

## 2021-01-01 DIAGNOSIS — N183 Chronic kidney disease, stage 3 unspecified: Secondary | ICD-10-CM | POA: Diagnosis not present

## 2021-01-01 LAB — IRON AND TIBC
Iron: 135 ug/dL (ref 28–170)
Saturation Ratios: 52 % — ABNORMAL HIGH (ref 10.4–31.8)
TIBC: 262 ug/dL (ref 250–450)
UIBC: 127 ug/dL

## 2021-01-01 LAB — FERRITIN: Ferritin: 1064 ng/mL — ABNORMAL HIGH (ref 11–307)

## 2021-01-01 LAB — POCT HEMOGLOBIN-HEMACUE: Hemoglobin: 11.3 g/dL — ABNORMAL LOW (ref 12.0–15.0)

## 2021-01-01 MED ORDER — EPOETIN ALFA-EPBX 10000 UNIT/ML IJ SOLN
INTRAMUSCULAR | Status: AC
  Filled 2021-01-01: qty 1

## 2021-01-01 MED ORDER — EPOETIN ALFA-EPBX 10000 UNIT/ML IJ SOLN
10000.0000 [IU] | INTRAMUSCULAR | Status: DC
  Administered 2021-01-01: 10000 [IU] via SUBCUTANEOUS

## 2021-01-15 ENCOUNTER — Encounter (HOSPITAL_COMMUNITY): Payer: Medicare Other

## 2021-01-15 ENCOUNTER — Other Ambulatory Visit: Payer: Self-pay

## 2021-01-15 ENCOUNTER — Encounter (HOSPITAL_COMMUNITY)
Admission: RE | Admit: 2021-01-15 | Discharge: 2021-01-15 | Disposition: A | Discharge status: Home / Self Care | Payer: Medicare Other | Source: Ambulatory Visit | Attending: Nephrology | Admitting: Nephrology

## 2021-01-15 VITALS — BP 122/59 | HR 67 | Temp 97.6°F

## 2021-01-15 DIAGNOSIS — D631 Anemia in chronic kidney disease: Secondary | ICD-10-CM | POA: Diagnosis not present

## 2021-01-15 DIAGNOSIS — N183 Chronic kidney disease, stage 3 unspecified: Secondary | ICD-10-CM | POA: Diagnosis not present

## 2021-01-15 LAB — POCT HEMOGLOBIN-HEMACUE: Hemoglobin: 10 g/dL — ABNORMAL LOW (ref 12.0–15.0)

## 2021-01-15 MED ORDER — EPOETIN ALFA-EPBX 10000 UNIT/ML IJ SOLN
INTRAMUSCULAR | Status: AC
  Filled 2021-01-15: qty 1

## 2021-01-15 MED ORDER — EPOETIN ALFA-EPBX 10000 UNIT/ML IJ SOLN
10000.0000 [IU] | INTRAMUSCULAR | Status: DC
  Administered 2021-01-15: 10000 [IU] via SUBCUTANEOUS

## 2021-01-26 ENCOUNTER — Ambulatory Visit: Payer: Self-pay | Admitting: Surgery

## 2021-01-26 DIAGNOSIS — K8010 Calculus of gallbladder with chronic cholecystitis without obstruction: Secondary | ICD-10-CM | POA: Diagnosis not present

## 2021-01-29 ENCOUNTER — Encounter (HOSPITAL_COMMUNITY): Payer: Medicare Other

## 2021-01-29 ENCOUNTER — Encounter (HOSPITAL_COMMUNITY)
Admission: RE | Admit: 2021-01-29 | Discharge: 2021-01-29 | Disposition: A | Discharge status: Home / Self Care | Payer: Medicare Other | Source: Ambulatory Visit | Attending: Nephrology | Admitting: Nephrology

## 2021-01-29 ENCOUNTER — Other Ambulatory Visit: Payer: Self-pay

## 2021-01-29 VITALS — BP 130/53 | HR 73 | Resp 20

## 2021-01-29 DIAGNOSIS — D631 Anemia in chronic kidney disease: Secondary | ICD-10-CM | POA: Insufficient documentation

## 2021-01-29 DIAGNOSIS — N183 Chronic kidney disease, stage 3 unspecified: Secondary | ICD-10-CM | POA: Insufficient documentation

## 2021-01-29 LAB — IRON AND TIBC
Iron: 99 ug/dL (ref 28–170)
Saturation Ratios: 39 % — ABNORMAL HIGH (ref 10.4–31.8)
TIBC: 255 ug/dL (ref 250–450)
UIBC: 156 ug/dL

## 2021-01-29 LAB — POCT HEMOGLOBIN-HEMACUE: Hemoglobin: 9.7 g/dL — ABNORMAL LOW (ref 12.0–15.0)

## 2021-01-29 LAB — FERRITIN: Ferritin: 955 ng/mL — ABNORMAL HIGH (ref 11–307)

## 2021-01-29 MED ORDER — EPOETIN ALFA-EPBX 10000 UNIT/ML IJ SOLN
INTRAMUSCULAR | Status: AC
  Filled 2021-01-29: qty 1

## 2021-01-29 MED ORDER — EPOETIN ALFA-EPBX 10000 UNIT/ML IJ SOLN
10000.0000 [IU] | INTRAMUSCULAR | Status: DC
  Administered 2021-01-29: 10000 [IU] via SUBCUTANEOUS

## 2021-02-12 ENCOUNTER — Other Ambulatory Visit: Payer: Self-pay

## 2021-02-12 ENCOUNTER — Encounter (HOSPITAL_COMMUNITY)
Admission: RE | Admit: 2021-02-12 | Discharge: 2021-02-12 | Disposition: A | Discharge status: Home / Self Care | Payer: Medicare Other | Source: Ambulatory Visit | Attending: Nephrology | Admitting: Nephrology

## 2021-02-12 VITALS — BP 111/63 | HR 65 | Temp 97.3°F | Resp 20

## 2021-02-12 DIAGNOSIS — D631 Anemia in chronic kidney disease: Secondary | ICD-10-CM | POA: Diagnosis not present

## 2021-02-12 DIAGNOSIS — N183 Chronic kidney disease, stage 3 unspecified: Secondary | ICD-10-CM

## 2021-02-12 LAB — POCT HEMOGLOBIN-HEMACUE: Hemoglobin: 9.3 g/dL — ABNORMAL LOW (ref 12.0–15.0)

## 2021-02-12 MED ORDER — EPOETIN ALFA-EPBX 10000 UNIT/ML IJ SOLN
INTRAMUSCULAR | Status: AC
  Administered 2021-02-12: 10000 [IU] via SUBCUTANEOUS
  Filled 2021-02-12: qty 1

## 2021-02-12 MED ORDER — EPOETIN ALFA-EPBX 10000 UNIT/ML IJ SOLN: 15000.0000 [IU] | INTRAMUSCULAR | Status: DC

## 2021-02-12 MED ORDER — EPOETIN ALFA-EPBX 2000 UNIT/ML IJ SOLN
INTRAMUSCULAR | Status: AC
  Administered 2021-02-12: 2000 [IU] via SUBCUTANEOUS
  Filled 2021-02-12: qty 1

## 2021-02-12 MED ORDER — EPOETIN ALFA-EPBX 3000 UNIT/ML IJ SOLN
INTRAMUSCULAR | Status: AC
  Administered 2021-02-12: 3000 [IU] via SUBCUTANEOUS
  Filled 2021-02-12: qty 1

## 2021-02-26 ENCOUNTER — Encounter (HOSPITAL_COMMUNITY)
Admission: RE | Admit: 2021-02-26 | Discharge: 2021-02-26 | Disposition: A | Discharge status: Home / Self Care | Payer: Medicare Other | Source: Ambulatory Visit | Attending: Nephrology | Admitting: Nephrology

## 2021-02-26 ENCOUNTER — Other Ambulatory Visit: Payer: Self-pay

## 2021-02-26 VITALS — BP 107/49 | HR 74 | Temp 97.2°F | Resp 20

## 2021-02-26 DIAGNOSIS — N183 Chronic kidney disease, stage 3 unspecified: Secondary | ICD-10-CM | POA: Insufficient documentation

## 2021-02-26 DIAGNOSIS — D631 Anemia in chronic kidney disease: Secondary | ICD-10-CM | POA: Insufficient documentation

## 2021-02-26 LAB — IRON AND TIBC
Iron: 113 ug/dL (ref 28–170)
Saturation Ratios: 44 % — ABNORMAL HIGH (ref 10.4–31.8)
TIBC: 258 ug/dL (ref 250–450)
UIBC: 145 ug/dL

## 2021-02-26 LAB — FERRITIN: Ferritin: 1003 ng/mL — ABNORMAL HIGH (ref 11–307)

## 2021-02-26 LAB — POCT HEMOGLOBIN-HEMACUE: Hemoglobin: 10.5 g/dL — ABNORMAL LOW (ref 12.0–15.0)

## 2021-02-26 MED ORDER — EPOETIN ALFA-EPBX 2000 UNIT/ML IJ SOLN
INTRAMUSCULAR | Status: AC
  Administered 2021-02-26: 2000 [IU] via SUBCUTANEOUS
  Filled 2021-02-26: qty 1

## 2021-02-26 MED ORDER — EPOETIN ALFA-EPBX 10000 UNIT/ML IJ SOLN
INTRAMUSCULAR | Status: AC
  Administered 2021-02-26: 10000 [IU] via SUBCUTANEOUS
  Filled 2021-02-26: qty 1

## 2021-02-26 MED ORDER — EPOETIN ALFA-EPBX 10000 UNIT/ML IJ SOLN: 15000.0000 [IU] | INTRAMUSCULAR | Status: DC

## 2021-02-26 MED ORDER — EPOETIN ALFA-EPBX 3000 UNIT/ML IJ SOLN
INTRAMUSCULAR | Status: AC
  Administered 2021-02-26: 3000 [IU] via SUBCUTANEOUS
  Filled 2021-02-26: qty 1

## 2021-03-12 ENCOUNTER — Encounter (HOSPITAL_COMMUNITY): Payer: Medicare Other

## 2021-03-13 ENCOUNTER — Other Ambulatory Visit: Payer: Self-pay

## 2021-03-13 ENCOUNTER — Encounter (HOSPITAL_COMMUNITY)
Admission: RE | Admit: 2021-03-13 | Discharge: 2021-03-13 | Disposition: A | Discharge status: Home / Self Care | Payer: Medicare Other | Source: Ambulatory Visit | Attending: Nephrology | Admitting: Nephrology

## 2021-03-13 VITALS — BP 114/58 | HR 70 | Temp 98.1°F | Resp 18

## 2021-03-13 DIAGNOSIS — N183 Chronic kidney disease, stage 3 unspecified: Secondary | ICD-10-CM | POA: Diagnosis not present

## 2021-03-13 DIAGNOSIS — D631 Anemia in chronic kidney disease: Secondary | ICD-10-CM | POA: Diagnosis not present

## 2021-03-13 LAB — POCT HEMOGLOBIN-HEMACUE: Hemoglobin: 9.3 g/dL — ABNORMAL LOW (ref 12.0–15.0)

## 2021-03-13 MED ORDER — EPOETIN ALFA-EPBX 10000 UNIT/ML IJ SOLN
15000.0000 [IU] | INTRAMUSCULAR | Status: DC
Start: 2021-03-13 — End: 2021-03-14

## 2021-03-13 MED ORDER — EPOETIN ALFA-EPBX 10000 UNIT/ML IJ SOLN
INTRAMUSCULAR | Status: AC
  Administered 2021-03-13: 15000 [IU] via SUBCUTANEOUS
  Filled 2021-03-13: qty 2

## 2021-03-17 DIAGNOSIS — D509 Iron deficiency anemia, unspecified: Secondary | ICD-10-CM | POA: Diagnosis not present

## 2021-03-17 DIAGNOSIS — N1832 Chronic kidney disease, stage 3b: Secondary | ICD-10-CM | POA: Diagnosis not present

## 2021-03-20 DIAGNOSIS — E559 Vitamin D deficiency, unspecified: Secondary | ICD-10-CM | POA: Diagnosis not present

## 2021-03-20 DIAGNOSIS — E46 Unspecified protein-calorie malnutrition: Secondary | ICD-10-CM | POA: Diagnosis not present

## 2021-03-20 DIAGNOSIS — K8044 Calculus of bile duct with chronic cholecystitis without obstruction: Secondary | ICD-10-CM | POA: Diagnosis not present

## 2021-03-20 DIAGNOSIS — D631 Anemia in chronic kidney disease: Secondary | ICD-10-CM | POA: Diagnosis not present

## 2021-03-20 DIAGNOSIS — N1832 Chronic kidney disease, stage 3b: Secondary | ICD-10-CM | POA: Diagnosis not present

## 2021-03-27 ENCOUNTER — Inpatient Hospital Stay (HOSPITAL_COMMUNITY): Admission: RE | Admit: 2021-03-27 | Payer: Medicare Other | Source: Ambulatory Visit

## 2021-04-10 ENCOUNTER — Encounter (HOSPITAL_COMMUNITY)
Admission: RE | Admit: 2021-04-10 | Discharge: 2021-04-10 | Disposition: A | Discharge status: Home / Self Care | Payer: Medicare Other | Source: Ambulatory Visit | Attending: Nephrology | Admitting: Nephrology

## 2021-04-10 ENCOUNTER — Other Ambulatory Visit: Payer: Self-pay

## 2021-04-10 ENCOUNTER — Ambulatory Visit (INDEPENDENT_AMBULATORY_CARE_PROVIDER_SITE_OTHER): Payer: Medicare Other | Admitting: Nurse Practitioner

## 2021-04-10 ENCOUNTER — Encounter: Payer: Self-pay | Admitting: Nurse Practitioner

## 2021-04-10 VITALS — BP 139/64 | HR 78 | Temp 97.7°F | Ht 59.0 in | Wt 84.5 lb

## 2021-04-10 VITALS — BP 136/44 | HR 63 | Temp 97.4°F | Resp 20

## 2021-04-10 DIAGNOSIS — N183 Chronic kidney disease, stage 3 unspecified: Secondary | ICD-10-CM | POA: Insufficient documentation

## 2021-04-10 DIAGNOSIS — M5432 Sciatica, left side: Secondary | ICD-10-CM

## 2021-04-10 DIAGNOSIS — J302 Other seasonal allergic rhinitis: Secondary | ICD-10-CM | POA: Diagnosis not present

## 2021-04-10 DIAGNOSIS — D631 Anemia in chronic kidney disease: Secondary | ICD-10-CM | POA: Insufficient documentation

## 2021-04-10 LAB — IRON AND TIBC
Iron: 83 ug/dL (ref 28–170)
Saturation Ratios: 32 % — ABNORMAL HIGH (ref 10.4–31.8)
TIBC: 258 ug/dL (ref 250–450)
UIBC: 175 ug/dL

## 2021-04-10 LAB — POCT HEMOGLOBIN-HEMACUE: Hemoglobin: 8.8 g/dL — ABNORMAL LOW (ref 12.0–15.0)

## 2021-04-10 LAB — FERRITIN: Ferritin: 1170 ng/mL — ABNORMAL HIGH (ref 11–307)

## 2021-04-10 MED ORDER — EPOETIN ALFA-EPBX 3000 UNIT/ML IJ SOLN
INTRAMUSCULAR | Status: AC
  Administered 2021-04-10: 3000 [IU] via SUBCUTANEOUS
  Filled 2021-04-10: qty 1

## 2021-04-10 MED ORDER — EPOETIN ALFA-EPBX 2000 UNIT/ML IJ SOLN
INTRAMUSCULAR | Status: AC
  Administered 2021-04-10: 2000 [IU] via SUBCUTANEOUS
  Filled 2021-04-10: qty 1

## 2021-04-10 MED ORDER — EPOETIN ALFA-EPBX 40000 UNIT/ML IJ SOLN: 25000.0000 [IU] | INTRAMUSCULAR | Status: DC

## 2021-04-10 MED ORDER — EPOETIN ALFA-EPBX 10000 UNIT/ML IJ SOLN
INTRAMUSCULAR | Status: AC
  Administered 2021-04-10: 20000 [IU] via SUBCUTANEOUS
  Filled 2021-04-10: qty 2

## 2021-04-10 NOTE — Progress Notes (Signed)
Acute Office Visit  Subjective:    Patient ID: Julia Kerr, female    DOB: 07-Jan-1923, 87 y.o.   MRN: [id number]  Chief Complaint  Patient presents with  . Back Pain    HPI Patient is in today for evaluation of lower back pain, mostly on the left side of the back. This radiates around to the left hip and buttocks. Sometimes this stretches into the upper left leg. She states taht this started one night a few weeks ago when she slept on some fabric which was stiffer than what she is used to. amost feels as though her skin in this area is tender as a result. She has also been having nasal congestion. States that her nose will not stop running, just runs all the time. She denies ear pain, cough, wheezing, sore throat, or wheezing. She does not take anything for allergies. She states that she will, sometimes, take tylenol to help her back pain which does help. She denies chest pain, chest pressure, or shortness of breath. She denies headaches, dizziness, or weakness.   History reviewed. No pertinent past medical history.  Past Surgical History:  Procedure Laterality Date  . CATARACT EXTRACTION, BILATERAL    . SHOULDER SURGERY      History reviewed. No pertinent family history.  Social History   Socioeconomic History  . Marital status: Widowed    Spouse name: Not on file  . Number of children: Not on file  . Years of education: Not on file  . Highest education level: Not on file  Occupational History  . Not on file  Tobacco Use  . Smoking status: Never Smoker  . Smokeless tobacco: Never Used  Vaping Use  . Vaping Use: Never used  Substance and Sexual Activity  . Alcohol use: Never  . Drug use: Never  . Sexual activity: Not Currently  Other Topics Concern  . Not on file  Social History Narrative  . Not on file   Social Determinants of Health   Financial Resource Strain: Not on file  Food Insecurity: Not on file  Transportation Needs: Not on file  Physical Activity:  Not on file  Stress: Not on file  Social Connections: Not on file  Intimate Partner Violence: Not on file    Outpatient Medications Prior to Visit  Medication Sig Dispense Refill  . Polyethyl Glycol-Propyl Glycol (SYSTANE FREE OP) Apply to eye.    Marland Kitchen epoetin alfa-epbx (RETACRIT) injection 25,000 Units      No facility-administered medications prior to visit.    Allergies  Allergen Reactions  . Food Other (See Comments)    Apples and strawberries cause burning of lips    Review of Systems  Constitutional: Negative for activity change, chills and fever.  HENT: Positive for congestion, postnasal drip and rhinorrhea. Negative for sinus pain, sneezing and sore throat.   Eyes: Negative.   Respiratory: Negative for cough, chest tightness, shortness of breath and wheezing.   Cardiovascular: Negative for chest pain and palpitations.  Gastrointestinal: Negative for constipation, diarrhea, nausea and vomiting.  Musculoskeletal: Positive for back pain. Negative for myalgias.       Radiation into the left hip and left upper leg.   Skin: Negative for rash.  Allergic/Immunologic: Positive for environmental allergies.  Neurological: Negative for dizziness, weakness and headaches.  Psychiatric/Behavioral: Negative for self-injury. The patient is not nervous/anxious.   All other systems reviewed and are negative.      Objective:    Physical Exam Vitals and  nursing note reviewed.  Constitutional:      Appearance: Normal appearance. She is well-developed.  HENT:     Head: Normocephalic and atraumatic.     Right Ear: Ear canal and external ear normal. Tympanic membrane is bulging. Tympanic membrane is not erythematous.     Left Ear: Ear canal and external ear normal. Tympanic membrane is bulging. Tympanic membrane is not erythematous.     Nose: Congestion present.     Right Sinus: No maxillary sinus tenderness or frontal sinus tenderness.     Left Sinus: No maxillary sinus tenderness or  frontal sinus tenderness.     Mouth/Throat:     Pharynx: Oropharynx is clear.  Eyes:     Pupils: Pupils are equal, round, and reactive to light.  Neck:     Vascular: No carotid bruit.  Cardiovascular:     Rate and Rhythm: Normal rate and regular rhythm.     Pulses: Normal pulses.     Heart sounds: Normal heart sounds.  Pulmonary:     Effort: Pulmonary effort is normal.     Breath sounds: Normal breath sounds.  Abdominal:     Palpations: Abdomen is soft.  Musculoskeletal:        General: Normal range of motion.     Cervical back: Normal range of motion and neck supple.     Comments: There are no palpable abnormalities of the lower back or left hip. No tenderness with palpation today.   Skin:    General: Skin is warm and dry.  Neurological:     Mental Status: She is alert and oriented to person, place, and time. Mental status is at baseline.  Psychiatric:        Mood and Affect: Mood normal.        Behavior: Behavior normal.        Thought Content: Thought content normal.        Judgment: Judgment normal.     Today's Vitals   04/10/21 1033  BP: 139/64  Pulse: 78  Temp: 97.7 F (36.5 C)  SpO2: 100%  Weight: 84 lb 8 oz (38.3 kg)  Height: 4\' 11"  (1.499 m)   Body mass index is 17.07 kg/m.   Wt Readings from Last 3 Encounters:  04/10/21 84 lb 8 oz (38.3 kg)  12/03/20 82 lb (37.2 kg)  07/21/20 (!) 86 lb 14.4 oz (39.4 kg)    Health Maintenance Due  Topic Date Due  . COVID-19 Vaccine (1) Never done  . TETANUS/TDAP  Never done  . DEXA SCAN  Never done  . PNA vac Low Risk Adult (1 of 2 - PCV13) Never done    There are no preventive care reminders to display for this patient.   Lab Results  Component Value Date   TSH 0.341 (L) 07/21/2020   Lab Results  Component Value Date   WBC 8.0 12/03/2020   HGB 8.8 (L) 04/10/2021   HCT 31.7 (L) 12/03/2020   MCV 87.6 12/03/2020   PLT 211.0 12/03/2020   Lab Results  Component Value Date   NA 137 12/03/2020   K 4.4  12/03/2020   CO2 22 12/03/2020   GLUCOSE 120 (H) 12/03/2020   BUN 36 (H) 12/03/2020   CREATININE 1.14 12/03/2020   BILITOT 1.4 (H) 12/03/2020   ALKPHOS 83 12/03/2020   AST 23 12/03/2020   ALT 16 12/03/2020   PROT 7.1 12/03/2020   ALBUMIN 4.6 12/03/2020   CALCIUM 9.2 12/03/2020   GFR 40.98 (L) 12/03/2020  Lab Results  Component Value Date   CHOL 78 (L) 07/21/2020   Lab Results  Component Value Date   HDL 28 (L) 07/21/2020   Lab Results  Component Value Date   LDLCALC 33 07/21/2020   Lab Results  Component Value Date   TRIG 82 07/21/2020   Lab Results  Component Value Date   CHOLHDL 2.8 07/21/2020   Lab Results  Component Value Date   HGBA1C 4.7 (L) 07/21/2020       Assessment & Plan:  1. Seasonal allergic rhinitis, unspecified trigger Recommend trial of OTC childrens clairin 10mg  daily. Monitor closely.   2. Left sided sciatica Recommend use of OTC tylenol as needed and as indicated to help with pain. No palpable abnormalities noted of low back or left hip.   3. Stage 3 chronic kidney disease, unspecified whether stage 3a or 3b CKD ([place]) Continue to monitor.   Problem List Items Addressed This Visit      Respiratory   Seasonal allergic rhinitis - Primary     Nervous and Auditory   Left sided sciatica     Genitourinary   CKD (chronic kidney disease), stage III ([place])       Ronnell Freshwater, NP

## 2021-04-10 NOTE — Patient Instructions (Addendum)
Recommend children's claritin, which is over the counter to help with allergies. A dose of 10mg  daily is appropriate.  Rinitis alrgica en adultos Allergic Rhinitis, Adult  La rinitis alrgica es una reaccin alrgica que afecta la membrana mucosa que se encuentra en la nariz. La membrana mucosa es el tejido que produce mucosidad. Existen dos tipos de rinitis alrgica:  Psychologist, counselling. A este tipo tambin se le llama "fiebre del heno" y ocurre solo durante ciertas estaciones.  Perenne. Este tipo puede ocurrir en cualquier momento del ao. La rinitis alrgica no puede transmitirse de Ardelia Mems persona a otra. Esta afeccin puede ser leve, moderada o grave. Puede aparecer a cualquier edad y se puede superar con los [place]. Cules son las causas? Esta afeccin es causada por alrgenos. Estas son cosas que pueden causar Nurse, mental health. Los alrgenos de la rinitis Regulatory affairs officer y de la rinitis alrgica perenne pueden ser diferentes.  La rinitis alrgica estacional es desencadenada por el polen. El polen puede provenir de los rboles, el pasto y las [place].  La rinitis alrgica perenne puede ser desencadenada por: ? caros del polvo. ? Protenas en la orina, la saliva o la caspa de [place]. La caspa son las clulas muertas de la piel de [place]. ? Humo en general, moho o humo de automviles. Qu incrementa el riesgo? Una persona tiene ms probabilidades de presentar esta afeccin si tiene antecedentes familiares de Set designer u otras afecciones relacionadas con las alergias, entre las que se incluyen:  Occupational hygienist.Es la inflamacin de partes de los ojos y los prpados.  Asma. Esta afeccin afecta los pulmones y dificulta la respiracin.  Dermatitis atpica o eczema. Es una inflamacin de la piel a largo plazo (crnica).  Alergias a los [employer] son los signos o sntomas? Los sntomas de esta afeccin incluyen:  Tos o estornudos.  Nariz tapada (congestin  nasal), picazn en la nariz o secrecin nasal.  Ojos y lagrimeo en los ojos.  Sensacin de mucosidad que gotea por la parte posterior de la garganta (goteo posnasal).  Dificultad para dormir.  Cansancio o fatiga.  Dolor de [place].  Dolor de Investment banker, operational. Cmo se diagnostica? Esta afeccin se puede diagnosticar por sus sntomas, sus antecedentes mdicos y un examen fsico. El mdico puede controlar si tiene alguna afeccin relacionada, por ejemplo:  Asma.  Ojo rojo. Esta es una inflamacin de los ojos causada por una infeccin (conjuntivitis).  Infeccin en los odos.  Infeccin de las vas respiratorias superiores. Esta es una infeccin de la nariz, la garganta o las vas respiratorias superiores. Tambin pueden hacerle estudios para averiguar qu alrgenos desencadenan sus sntomas. Por ejemplo, anlisis cutneos y de [place]. Cmo se trata? No hay cura para esta afeccin, pero el tratamiento puede ayudar a [employer] sntomas. El tratamiento puede incluir:  Tomar medicamentos que [employer] sntomas de la [place], como los corticoesteroides y los antihistamnicos. Medicamentos que pueden administrarse por inyeccin, aerosol nasal o pldoras.  Evitar los alrgenos que corresponda.  La exposicin repetida a pequeas cantidades de alrgenos para ayudarlo a generar defensas contra los alrgenos (inmunoterapia). Esto se realiza si otros tratamientos no han sido exitosos. Puede incluir lo siguiente: ? Investment banker, operational. Se trata de medicamentos inyectables que contienen pequeas cantidades de alrgenos. ? Inmunoterapia sublingual. Esta implica tomar pequeas dosis de un medicamento con alrgenos debajo de la lengua. Si estos tratamientos no funcionan, el mdico puede recetarle medicamentos ms nuevos y ms fuertes. Siga estas instrucciones en su casa: Evite los alrgenos  Averige a qu es alrgico y evite esos alrgenos. Hay medidas que puede tomar para ayudar a Research scientist (medical):  Si tiene Set designer perennes: ? Reemplace las alfombras por pisos de [place], baldosas o vinilo. Las alfombras pueden retener la caspa o los pelos de los animales y el polvo. ? No fume. No permita que fumen en su casa. ? Cambie los filtros de Tourist information centre manager y del aire acondicionado al menos una vez al mes.  Si tiene Water quality scientist, siga estos pasos durante la temporada de alergias: ? Mantenga las ventanas cerradas la mayor cantidad de [place] posible. ? Planee actividades al aire libre cuando las concentraciones de polen estn en su nivel ms bajo. Fjese en las concentraciones de polen antes de planificar actividades al [place]. ? Cuando ingrese al interior, [place] de ropa y dese una ducha antes de sentarse en los muebles o la cama.  Si tiene [employer] casa que produce alrgenos: ? Mantenga a la mascota fuera del dormitorio. ? Pase la aspiradora, barra y limpie el polvo con regularidad. Instrucciones generales  Use los medicamentos de venta libre y los recetados solamente como se lo haya indicado el mdico.  Beba suficiente lquido como para Theatre manager la orina de color amarillo plido.  Concurra a todas las visitas de seguimiento como se lo haya indicado el mdico. Esto es importante. Dnde buscar ms informacin  American Academy of Allergy, Asthma & Immunology (Academia Estadounidense de [place], [place] e Inmunologa): www.aaaai.org Comunquese con un mdico si:  Tiene fiebre.  Tiene tos que no desaparece.  Comienza a emitir un sonido agudo al respirar (sibilancias).  Los sntomas lo enlentecen o impiden que haga sus actividades normales [employer]. Solicite ayuda de inmediato si:  Le falta el aire. Este sntoma pueden representar un problema grave que constituye Engineer, maintenance (IT). No espere a ver si el sntoma desaparece. Solicite atencin mdica de inmediato. Comunquese con el servicio de emergencias de su localidad (911 en los [place]). No  conduzca por sus propios medios [employer] hospital. Resumen  La rinitis alrgica puede manejarse tomando medicamentos segn las indicaciones y [employer] alrgenos.  Si tiene Water quality scientist, [employer] las ventanas cerradas la mayor cantidad de tiempo posible durante la estacin de [place].  Comunquese con el mdico si le aparece una fiebre o una tos que no desaparece. Esta informacin no tiene Marine scientist el consejo del mdico. Asegrese de hacerle al mdico cualquier pregunta que tenga. Document Revised: 01/14/2020 Document Reviewed: 01/14/2020 Elsevier Patient Education  2021 [employer].

## 2021-04-12 DIAGNOSIS — M5432 Sciatica, left side: Secondary | ICD-10-CM | POA: Insufficient documentation

## 2021-04-12 DIAGNOSIS — J302 Other seasonal allergic rhinitis: Secondary | ICD-10-CM | POA: Insufficient documentation

## 2021-05-05 ENCOUNTER — Emergency Department (HOSPITAL_COMMUNITY): Payer: Medicare Other

## 2021-05-05 ENCOUNTER — Encounter (HOSPITAL_COMMUNITY): Payer: Self-pay | Admitting: Emergency Medicine

## 2021-05-05 ENCOUNTER — Other Ambulatory Visit: Payer: Self-pay

## 2021-05-05 ENCOUNTER — Emergency Department (HOSPITAL_COMMUNITY)
Admission: EM | Admit: 2021-05-05 | Discharge: 2021-05-05 | Disposition: A | Discharge status: Home / Self Care | Payer: Medicare Other | Attending: Emergency Medicine | Admitting: Emergency Medicine

## 2021-05-05 DIAGNOSIS — S39012A Strain of muscle, fascia and tendon of lower back, initial encounter: Secondary | ICD-10-CM | POA: Diagnosis not present

## 2021-05-05 DIAGNOSIS — R509 Fever, unspecified: Secondary | ICD-10-CM | POA: Insufficient documentation

## 2021-05-05 DIAGNOSIS — I517 Cardiomegaly: Secondary | ICD-10-CM | POA: Diagnosis not present

## 2021-05-05 DIAGNOSIS — Z20822 Contact with and (suspected) exposure to covid-19: Secondary | ICD-10-CM | POA: Insufficient documentation

## 2021-05-05 DIAGNOSIS — B349 Viral infection, unspecified: Secondary | ICD-10-CM

## 2021-05-05 DIAGNOSIS — N183 Chronic kidney disease, stage 3 unspecified: Secondary | ICD-10-CM | POA: Diagnosis not present

## 2021-05-05 DIAGNOSIS — M549 Dorsalgia, unspecified: Secondary | ICD-10-CM | POA: Diagnosis not present

## 2021-05-05 DIAGNOSIS — W57XXXA Bitten or stung by nonvenomous insect and other nonvenomous arthropods, initial encounter: Secondary | ICD-10-CM | POA: Insufficient documentation

## 2021-05-05 DIAGNOSIS — M545 Low back pain, unspecified: Secondary | ICD-10-CM | POA: Diagnosis not present

## 2021-05-05 DIAGNOSIS — S70262A Insect bite (nonvenomous), left hip, initial encounter: Secondary | ICD-10-CM | POA: Diagnosis not present

## 2021-05-05 DIAGNOSIS — Z743 Need for continuous supervision: Secondary | ICD-10-CM | POA: Diagnosis not present

## 2021-05-05 LAB — CBC WITH DIFFERENTIAL/PLATELET
Abs Immature Granulocytes: 0.14 10*3/uL — ABNORMAL HIGH (ref 0.00–0.07)
Basophils Absolute: 0 10*3/uL (ref 0.0–0.1)
Basophils Relative: 0 %
Eosinophils Absolute: 0 10*3/uL (ref 0.0–0.5)
Eosinophils Relative: 0 %
HCT: 29.6 % — ABNORMAL LOW (ref 36.0–46.0)
Hemoglobin: 8.9 g/dL — ABNORMAL LOW (ref 12.0–15.0)
Immature Granulocytes: 1 %
Lymphocytes Relative: 10 %
Lymphs Abs: 1.2 10*3/uL (ref 0.7–4.0)
MCH: 28.5 pg (ref 26.0–34.0)
MCHC: 30.1 g/dL (ref 30.0–36.0)
MCV: 94.9 fL (ref 80.0–100.0)
Monocytes Absolute: 2.1 10*3/uL — ABNORMAL HIGH (ref 0.1–1.0)
Monocytes Relative: 17 %
Neutro Abs: 8.8 10*3/uL — ABNORMAL HIGH (ref 1.7–7.7)
Neutrophils Relative %: 72 %
Platelets: 229 10*3/uL (ref 150–400)
RBC: 3.12 MIL/uL — ABNORMAL LOW (ref 3.87–5.11)
RDW: 17.5 % — ABNORMAL HIGH (ref 11.5–15.5)
WBC: 12.2 10*3/uL — ABNORMAL HIGH (ref 4.0–10.5)
nRBC: 0 % (ref 0.0–0.2)

## 2021-05-05 LAB — BASIC METABOLIC PANEL
Anion gap: 9 (ref 5–15)
BUN: 40 mg/dL — ABNORMAL HIGH (ref 8–23)
CO2: 22 mmol/L (ref 22–32)
Calcium: 9.1 mg/dL (ref 8.9–10.3)
Chloride: 106 mmol/L (ref 98–111)
Creatinine, Ser: 1.34 mg/dL — ABNORMAL HIGH (ref 0.44–1.00)
GFR, Estimated: 37 mL/min — ABNORMAL LOW (ref >60)
Glucose, Bld: 102 mg/dL — ABNORMAL HIGH (ref 70–99)
Potassium: 5 mmol/L (ref 3.5–5.1)
Sodium: 137 mmol/L (ref 135–145)

## 2021-05-05 LAB — RESP PANEL BY RT-PCR (FLU A&B, COVID) ARPGX2
Influenza A by PCR: NEGATIVE
Influenza B by PCR: NEGATIVE
SARS Coronavirus 2 by RT PCR: NEGATIVE

## 2021-05-05 LAB — URINALYSIS, ROUTINE W REFLEX MICROSCOPIC
Bilirubin Urine: NEGATIVE
Glucose, UA: NEGATIVE mg/dL
Ketones, ur: NEGATIVE mg/dL
Leukocytes,Ua: NEGATIVE
Nitrite: NEGATIVE
Protein, ur: NEGATIVE mg/dL
Specific Gravity, Urine: 1.005 (ref 1.005–1.030)
pH: 7 (ref 5.0–8.0)

## 2021-05-05 LAB — LACTIC ACID, PLASMA: Lactic Acid, Venous: 1.3 mmol/L (ref 0.5–1.9)

## 2021-05-05 MED ORDER — SODIUM CHLORIDE 0.9 % IV SOLN
INTRAVENOUS | Status: DC
  Administered 2021-05-05: 10 mL via INTRAVENOUS

## 2021-05-05 MED ORDER — OXYCODONE-ACETAMINOPHEN 5-325 MG PO TABS
1.0000 | ORAL_TABLET | Freq: Once | ORAL | Status: AC
  Administered 2021-05-05: 1 via ORAL
  Filled 2021-05-05: qty 1

## 2021-05-05 NOTE — ED Triage Notes (Signed)
Spanish speaking patient, arrives via EMS from her home.  She lives with her grandson per EMS.  Patient co having low back pain.  Denies any type of injury.  EMS reports that the family member stated that she has problems with kidney function.

## 2021-05-05 NOTE — ED Provider Notes (Signed)
[facility] DEPT Provider Note   CSN: [id number] Arrival date & time: 05/05/21  1512     History Chief Complaint  Patient presents with  . Back Pain    Julia Kerr is a 87 y.o. female.  87 year old female who presents with back pain times several days which is atraumatic.  No radiation down her leg.  Denies any urinary symptoms.  No vomiting or diarrhea.  Pain characterizes sharp and worse with any movement.  No prior history of same.  No treatment use prior to arrival.  Patient able to ambulate but with great difficulty.  Video interpreter services used for this encounter        History reviewed. No pertinent past medical history.  Patient Active Problem List   Diagnosis Date Noted  . Seasonal allergic rhinitis 04/12/2021  . Left sided sciatica 04/12/2021  . Chronic rhinitis 02/06/2020  . Adverse food reaction 02/06/2020  . Vaccine counseling 02/06/2020  . Cellulitis of lower extremity 08/18/2019  . Insect bite 08/01/2019  . Low serum thyroid stimulating hormone (TSH) 08/17/2018  . Normocytic normochromic anemia 08/17/2018  . CKD (chronic kidney disease), stage III ([place]) 08/17/2018  . Dehydration, moderate 08/17/2018  . Low HDL (under 40) 08/17/2018  . Vitamin D deficiency 08/04/2018  . B12 deficiency-requiring injections in past 08/04/2018  . Bruit of right carotid artery 08/04/2018  . Choledocholithiasis with chronic cholecystitis- not amenable to surgery due to age 46/16/2019  . Biliary colic symptom- intolerant to dairy and fats due to gallstone presence 08/04/2018  . Environmental and seasonal allergies 08/04/2018  . Low weight for height- bmi 17.5 08/04/2018  . Postmenopausal bone loss 08/04/2018  . Chronic fatigue- with B12 def 08/04/2018    Past Surgical History:  Procedure Laterality Date  . CATARACT EXTRACTION, BILATERAL    . SHOULDER SURGERY       OB History   No obstetric history on file.     History reviewed. No  pertinent family history.  Social History   Tobacco Use  . Smoking status: Never Smoker  . Smokeless tobacco: Never Used  Vaping Use  . Vaping Use: Never used  Substance Use Topics  . Alcohol use: Never  . Drug use: Never    Home Medications Prior to Admission medications   Medication Sig Start Date End Date Taking? Authorizing Provider  Polyethyl Glycol-Propyl Glycol (SYSTANE FREE OP) Apply to eye.    [provider]    Allergies    Food  Review of Systems   Review of Systems  All other systems reviewed and are negative.   Physical Exam Updated Vital Signs BP (!) 142/108 (BP Location: Right Arm)   Pulse 79   Temp 100 F (37.8 C) (Oral)   Resp 16   SpO2 100%   Physical Exam Vitals and nursing note reviewed.  Constitutional:      General: She is not in acute distress.    Appearance: Normal appearance. She is well-developed. She is not toxic-appearing.  HENT:     Head: Normocephalic and atraumatic.  Eyes:     General: Lids are normal.     Conjunctiva/sclera: Conjunctivae normal.     Pupils: Pupils are equal, round, and reactive to light.  Neck:     Thyroid: No thyroid mass.     Trachea: No tracheal deviation.  Cardiovascular:     Rate and Rhythm: Normal rate and regular rhythm.     Heart sounds: Normal heart sounds. No murmur heard. No gallop.  Pulmonary:     Effort: Pulmonary effort is normal. No respiratory distress.     Breath sounds: Normal breath sounds. No stridor. No decreased breath sounds, wheezing, rhonchi or rales.  Abdominal:     General: Bowel sounds are normal. There is no distension.     Palpations: Abdomen is soft.     Tenderness: There is no abdominal tenderness. There is no rebound.  Musculoskeletal:        General: No tenderness. Normal range of motion.     Cervical back: Normal range of motion and neck supple.     Lumbar back: Bony tenderness present.       Back:  Skin:    General: Skin is warm and dry.     Findings: No  abrasion or rash.  Neurological:     Mental Status: She is alert and oriented to person, place, and time.     GCS: GCS eye subscore is 4. GCS verbal subscore is 5. GCS motor subscore is 6.     Cranial Nerves: No cranial nerve deficit.     Sensory: No sensory deficit.     Comments: Strength is 5 of 5 in bilateral lower extremities.  No foot drop appreciated.  Psychiatric:        Speech: Speech normal.        Behavior: Behavior normal.     ED Results / Procedures / Treatments   Labs (all labs ordered are listed, but only abnormal results are displayed) Labs Reviewed  URINE CULTURE  CULTURE, BLOOD (ROUTINE X 2)  CULTURE, BLOOD (ROUTINE X 2)  RESP PANEL BY RT-PCR (FLU A&B, COVID) ARPGX2  CBC WITH DIFFERENTIAL/PLATELET  BASIC METABOLIC PANEL  URINALYSIS, ROUTINE W REFLEX MICROSCOPIC  LACTIC ACID, PLASMA    EKG None  Radiology No results found.  Procedures Procedures   Medications Ordered in ED Medications  oxyCODONE-acetaminophen (PERCOCET/ROXICET) 5-325 MG per tablet 1 tablet (has no administration in time range)  0.9 %  sodium chloride infusion (has no administration in time range)    ED Course  I have reviewed the triage vital signs and the nursing notes.  Pertinent labs & imaging results that were available during my care of the patient were reviewed by me and considered in my medical decision making (see chart for details).    MDM Rules/Calculators/A&P                         Patient medicated for pain or lower back does feel better.  Patient is LS-spine without acute fractures.  Chest x-ray is normal. Patient low-grade temperature here.  Work-up here has been negative including a negative urinalysis, COVID test.  Patient does have a bug bite at the left anterior hip.  No evidence of cellulitis.  Will discharge Final Clinical Impression(s) / ED Diagnoses Final diagnoses:  None    Rx / DC Orders ED Discharge Orders    None       Julia Leigh,  MD 05/05/21 2113

## 2021-05-05 NOTE — Discharge Instructions (Signed)
Take Tylenol and Motrin as directed for pain

## 2021-05-05 NOTE — ED Notes (Signed)
Spanish interpreter stratus machine at the bedside- Dr Zenia Resides at the bedside

## 2021-05-05 NOTE — ED Notes (Signed)
Lab called regarding status of covid test. Lab stated 30 minutes for result

## 2021-05-08 ENCOUNTER — Inpatient Hospital Stay (HOSPITAL_COMMUNITY)
Admission: RE | Admit: 2021-05-08 | Discharge: 2021-05-08 | Disposition: A | Discharge status: Home / Self Care | Payer: Medicare Other | Source: Ambulatory Visit | Attending: Nephrology | Admitting: Nephrology

## 2021-05-08 ENCOUNTER — Encounter (HOSPITAL_COMMUNITY): Payer: Self-pay

## 2021-05-08 LAB — URINE CULTURE: Culture: 10000 — AB

## 2021-05-09 ENCOUNTER — Telehealth: Payer: Self-pay | Admitting: Emergency Medicine

## 2021-05-09 NOTE — Progress Notes (Signed)
ED Antimicrobial Stewardship Positive Culture Follow Up   Julia Kerr is an 87 y.o. female who presented to [facility] on 05/05/2021 with a chief complaint of  Chief Complaint  Patient presents with  . Back Pain    Recent Results (from the past 720 hour(s))  Urine Culture     Status: Abnormal   Collection Time: 05/05/21  4:13 PM   Specimen: Urine, Random  Result Value Ref Range Status   Specimen Description   Final    URINE, RANDOM Performed at [facility] [address] [place], [place] [address]    Special Requests   Final    NONE Performed at [facility], [place] [address] [place], [place] [address]    Culture (A)  Final    10,000 COLONIES/mL ESCHERICHIA COLI 50,000 COLONIES/mL GROUP B STREP(S.AGALACTIAE)ISOLATED TESTING AGAINST S. AGALACTIAE NOT ROUTINELY PERFORMED DUE TO PREDICTABILITY OF AMP/PEN/VAN SUSCEPTIBILITY. Performed at [facility], [place] [address], [place], [place] [address]    Report Status 05/08/2021 FINAL  Final   Organism ID, Bacteria ESCHERICHIA COLI (A)  Final      Susceptibility   Escherichia coli - MIC*    AMPICILLIN <=2 SENSITIVE Sensitive     CEFAZOLIN <=4 SENSITIVE Sensitive     CEFEPIME <=0.12 SENSITIVE Sensitive     CEFTRIAXONE <=0.25 SENSITIVE Sensitive     CIPROFLOXACIN <=0.25 SENSITIVE Sensitive     GENTAMICIN <=1 SENSITIVE Sensitive     IMIPENEM <=0.25 SENSITIVE Sensitive     NITROFURANTOIN <=16 SENSITIVE Sensitive     TRIMETH/SULFA <=20 SENSITIVE Sensitive     AMPICILLIN/SULBACTAM <=2 SENSITIVE Sensitive     PIP/TAZO <=4 SENSITIVE Sensitive     * 10,000 COLONIES/mL ESCHERICHIA COLI  Culture, blood (Routine X 2) w Reflex to ID Panel     Status: None (Preliminary result)   Collection Time: 05/05/21  4:13 PM   Specimen: BLOOD  Result Value Ref Range Status   Specimen Description   Final    BLOOD LEFT ARM Performed at [facility] [address] [place], [place]  [address]    Special Requests   Final    BOTTLES DRAWN AEROBIC AND ANAEROBIC Blood Culture results may not be optimal due to an inadequate volume of blood received in culture bottles Performed at [facility] [address], [place], [place] [address]    Culture   Final    NO GROWTH 3 DAYS Performed at [facility], [place] [address], [place], [place] [address]    Report Status PENDING  Incomplete  Resp Panel by RT-PCR (Flu A&B, Covid) Nasopharyngeal Swab     Status: None   Collection Time: 05/05/21  4:13 PM   Specimen: Nasopharyngeal Swab; Nasopharyngeal(NP) swabs in vial transport medium  Result Value Ref Range Status   SARS Coronavirus 2 by RT PCR NEGATIVE NEGATIVE Final    Comment: (NOTE) SARS-CoV-2 target nucleic acids are NOT DETECTED.  The SARS-CoV-2 RNA is generally detectable in upper respiratory specimens during the acute phase of infection. The lowest concentration of SARS-CoV-2 viral copies this assay can detect is 138 copies/mL. A negative result does not preclude SARS-Cov-2 infection and should not be used as the sole basis for treatment or other patient management decisions. A negative result may occur with  improper specimen collection/handling, submission of specimen other than nasopharyngeal swab, presence of viral mutation(s) within the areas targeted by this assay, and inadequate number of viral copies(<138 copies/mL). A  negative result must be combined with clinical observations, patient history, and epidemiological information. The expected result is Negative.  Fact Sheet for Patients:  EntrepreneurPulse.com.au  Fact Sheet for Healthcare Providers:  IncredibleEmployment.be  This test is no t yet approved or cleared by the [place] FDA and  has been authorized for detection and/or diagnosis of SARS-CoV-2 by FDA under an Emergency Use Authorization (EUA). This EUA will remain  in effect (meaning  this test can be used) for the duration of the COVID-19 declaration under Section 564(b)(1) of the Act, 21 U.S.C.section 360bbb-3(b)(1), unless the authorization is terminated  or revoked sooner.       Influenza A by PCR NEGATIVE NEGATIVE Final   Influenza B by PCR NEGATIVE NEGATIVE Final    Comment: (NOTE) The Xpert Xpress SARS-CoV-2/FLU/RSV plus assay is intended as an aid in the diagnosis of influenza from Nasopharyngeal swab specimens and should not be used as a sole basis for treatment. Nasal washings and aspirates are unacceptable for Xpert Xpress SARS-CoV-2/FLU/RSV testing.  Fact Sheet for Patients: EntrepreneurPulse.com.au  Fact Sheet for Healthcare Providers: IncredibleEmployment.be  This test is not yet approved or cleared by the [place] FDA and has been authorized for detection and/or diagnosis of SARS-CoV-2 by FDA under an Emergency Use Authorization (EUA). This EUA will remain in effect (meaning this test can be used) for the duration of the COVID-19 declaration under Section 564(b)(1) of the Act, 21 U.S.C. section 360bbb-3(b)(1), unless the authorization is terminated or revoked.  Performed at [facility], [place] [address], [place], [place] [address]     Culture reviewed with ED provider. Asymptomatic bacteriuria, no antibiotic treatment indicated.   ED Provider: Sherol Dade, PA-C   Lenis Noon, PharmD 05/09/2021, 12:18 PM Clinical Pharmacist [phone]

## 2021-05-09 NOTE — Telephone Encounter (Signed)
Post ED Visit - Positive Culture Follow-up  Culture report reviewed by antimicrobial stewardship pharmacist: [place] Team []  Elenor Quinones, Pharm.D. []  Heide Guile, Pharm.D., BCPS AQ-ID []  Parks Neptune, Pharm.D., BCPS []  Alycia Rossetti, Pharm.D., BCPS []  [place], [place].D., BCPS, AAHIVP []  Legrand Como, Pharm.D., BCPS, AAHIVP []  Salome Arnt, PharmD, BCPS []  Johnnette Gourd, PharmD, BCPS []  Hughes Better, PharmD, BCPS []  Leeroy Cha, PharmD []  Laqueta Linden, PharmD, BCPS []  Albertina Parr, PharmD  [place] Team []  Leodis Sias, PharmD []  Lindell Spar, PharmD []  Royetta Asal, PharmD []  Graylin Shiver, Rph []  Rema Fendt) Glennon Mac, PharmD []  Arlyn Dunning, PharmD []  Netta Cedars, PharmD []  Dia Sitter, PharmD []  Leone Haven, PharmD []  Gretta Arab, PharmD [x]  Theodis Shove, PharmD []  Peggyann Juba, PharmD []  Reuel Boom, PharmD   Positive urine culture No further patient follow-up is required at this time.  Sandi Raveling Ngan Qualls 05/09/2021, 4:15 PM

## 2021-05-10 LAB — CULTURE, BLOOD (ROUTINE X 2): Culture: NO GROWTH

## 2021-05-12 ENCOUNTER — Telehealth: Payer: Self-pay | Admitting: Physician Assistant

## 2021-05-12 ENCOUNTER — Ambulatory Visit: Payer: Medicare Other | Admitting: Nurse Practitioner

## 2021-05-12 NOTE — Telephone Encounter (Signed)
Patient's appointment was cancelled due to [facility] being out of office and patient's son called in and states his mother could not wait and he will be taking her to urgent care. It was for back pain. For documentation.

## 2021-05-15 ENCOUNTER — Other Ambulatory Visit: Payer: Self-pay

## 2021-05-15 ENCOUNTER — Ambulatory Visit
Admission: EM | Admit: 2021-05-15 | Discharge: 2021-05-15 | Disposition: A | Discharge status: Home / Self Care | Payer: Medicare Other | Attending: Emergency Medicine | Admitting: Emergency Medicine

## 2021-05-15 DIAGNOSIS — L02211 Cutaneous abscess of abdominal wall: Secondary | ICD-10-CM

## 2021-05-15 MED ORDER — TRIAMCINOLONE ACETONIDE 0.1 % EX CREA: 1.0000 "application " | TOPICAL_CREAM | Freq: Two times a day (BID) | CUTANEOUS | 0 refills | Status: DC

## 2021-05-15 MED ORDER — DOXYCYCLINE HYCLATE 100 MG PO CAPS: 100.0000 mg | ORAL_CAPSULE | Freq: Two times a day (BID) | ORAL | 0 refills | Status: AC

## 2021-05-15 NOTE — Discharge Instructions (Signed)
Doxycycline twice daily x10 days Triamcinolone twice daily to areas to help with itching Tylenol 2 tablets every 4-6 hours as needed for pain Follow-up if not improving or worsening

## 2021-05-15 NOTE — ED Triage Notes (Signed)
Pt present an insect bite  Located on her stomach.  Pt state the area is red and itching. Pt noticed the bite a week ago.

## 2021-05-15 NOTE — ED Provider Notes (Signed)
[facility]    CSN: [id number] Arrival date & time: 05/15/21  1052      History   Chief Complaint Chief Complaint  Patient presents with  . Insect Bite    HPI Julia Kerr is a 87 y.o. female history of CKD stage III, vitamin B12 deficiency, presenting today for evaluation of insect bite.  Reports area on stomach that is red with associated itching.  Has been present for approximately 1 week.  Has had associating itching and pain.  Unsure of possible tick bite.  Had slight headache this morning.  Denies any nausea vomiting abdominal pain or diarrhea.  Denies any dizziness or lightheadedness.  Has had fatigue, but attributes this to low platelets as this is normal for her.  Denies other rashes.   HPI  History reviewed. No pertinent past medical history.  Patient Active Problem List   Diagnosis Date Noted  . Seasonal allergic rhinitis 04/12/2021  . Left sided sciatica 04/12/2021  . Chronic rhinitis 02/06/2020  . Adverse food reaction 02/06/2020  . Vaccine counseling 02/06/2020  . Cellulitis of lower extremity 08/18/2019  . Insect bite 08/01/2019  . Low serum thyroid stimulating hormone (TSH) 08/17/2018  . Normocytic normochromic anemia 08/17/2018  . CKD (chronic kidney disease), stage III ([place]) 08/17/2018  . Dehydration, moderate 08/17/2018  . Low HDL (under 40) 08/17/2018  . Vitamin D deficiency 08/04/2018  . B12 deficiency-requiring injections in past 08/04/2018  . Bruit of right carotid artery 08/04/2018  . Choledocholithiasis with chronic cholecystitis- not amenable to surgery due to age 38/16/2019  . Biliary colic symptom- intolerant to dairy and fats due to gallstone presence 08/04/2018  . Environmental and seasonal allergies 08/04/2018  . Low weight for height- bmi 17.5 08/04/2018  . Postmenopausal bone loss 08/04/2018  . Chronic fatigue- with B12 def 08/04/2018    Past Surgical History:  Procedure Laterality Date  . CATARACT EXTRACTION, BILATERAL     . SHOULDER SURGERY      OB History   No obstetric history on file.      Home Medications    Prior to Admission medications   Medication Sig Start Date End Date Taking? Authorizing Provider  doxycycline (VIBRAMYCIN) 100 MG capsule Take 1 capsule (100 mg total) by mouth 2 (two) times daily for 10 days. 05/15/21 05/25/21 Yes Tashai Catino C, PA-C  triamcinolone cream (KENALOG) 0.1 % Apply 1 application topically 2 (two) times daily. 05/15/21  Yes Zamantha Strebel C, PA-C  Polyethyl Glycol-Propyl Glycol (SYSTANE FREE OP) Apply to eye.    [provider]    Family History History reviewed. No pertinent family history.  Social History Social History   Tobacco Use  . Smoking status: Never Smoker  . Smokeless tobacco: Never Used  Vaping Use  . Vaping Use: Never used  Substance Use Topics  . Alcohol use: Never  . Drug use: Never     Allergies   Food   Review of Systems Review of Systems  Constitutional: Negative for fatigue and fever.  HENT: Negative for mouth sores.   Eyes: Negative for visual disturbance.  Respiratory: Negative for shortness of breath.   Cardiovascular: Negative for chest pain.  Gastrointestinal: Negative for abdominal pain, nausea and vomiting.  Genitourinary: Negative for genital sores.  Musculoskeletal: Negative for arthralgias and joint swelling.  Skin: Positive for color change. Negative for rash and wound.  Neurological: Negative for dizziness, weakness, light-headedness and headaches.     Physical Exam Triage Vital Signs ED Triage Vitals  Enc Vitals Group     BP      Pulse      Resp      Temp      Temp src      SpO2      Weight      Height      Head Circumference      Peak Flow      Pain Score      Pain Loc      Pain Edu?      Excl. in [place]?    No data found.  Updated Vital Signs BP (!) 133/57 (BP Location: Left Arm)   Pulse 82   Temp 98.4 F (36.9 C) (Oral)   Resp 16   SpO2 97%   Visual Acuity Right Eye  Distance:   Left Eye Distance:   Bilateral Distance:    Right Eye Near:   Left Eye Near:    Bilateral Near:     Physical Exam Vitals and nursing note reviewed.  Constitutional:      Appearance: She is well-developed.     Comments: No acute distress  HENT:     Head: Normocephalic and atraumatic.     Nose: Nose normal.  Eyes:     Conjunctiva/sclera: Conjunctivae normal.  Cardiovascular:     Rate and Rhythm: Normal rate.  Pulmonary:     Effort: Pulmonary effort is normal. No respiratory distress.  Abdominal:     General: There is no distension.  Musculoskeletal:        General: Normal range of motion.     Cervical back: Neck supple.     Comments: Ambulating independently with a cane  Skin:    General: Skin is warm and dry.     Comments: Left lower abdomen with oval area of erythema and mild induration,  scabbing noted to more lateral portion  Neurological:     Mental Status: She is alert and oriented to person, place, and time.      UC Treatments / Results  Labs (all labs ordered are listed, but only abnormal results are displayed) Labs Reviewed - No data to display  EKG   Radiology No results found.  Procedures Procedures (including critical care time)  Medications Ordered in UC Medications - No data to display  Initial Impression / Assessment and Plan / UC Course  I have reviewed the triage vital signs and the nursing notes.  Pertinent labs & imaging results that were available during my care of the patient were reviewed by me and considered in my medical decision making (see chart for details).    Given appearance of area to abdomen opting to place on doxycycline to cover for skin infection/abscess, triamcinolone topically to help with any localized inflammation from possible fight continue Tylenol for pain.  Continue to monitor, Discussed strict return precautions. Patient verbalized understanding and is agreeable with plan.  Final Clinical  Impressions(s) / UC Diagnoses   Final diagnoses:  Abscess of skin of abdomen     Discharge Instructions     Doxycycline twice daily x10 days Triamcinolone twice daily to areas to help with itching Tylenol 2 tablets every 4-6 hours as needed for pain Follow-up if not improving or worsening    ED Prescriptions    Medication Sig Dispense Auth. Provider   doxycycline (VIBRAMYCIN) 100 MG capsule Take 1 capsule (100 mg total) by mouth 2 (two) times daily for 10 days. 20 capsule Raistlin Gum C, PA-C   triamcinolone cream (  KENALOG) 0.1 % Apply 1 application topically 2 (two) times daily. 45 g [place], [place] C, PA-C     PDMP not reviewed this encounter.   [name], PA-C 05/15/21 1304

## 2021-06-05 ENCOUNTER — Encounter (HOSPITAL_COMMUNITY): Payer: Medicare Other

## 2021-09-05 ENCOUNTER — Ambulatory Visit (INDEPENDENT_AMBULATORY_CARE_PROVIDER_SITE_OTHER): Payer: Medicare Other

## 2021-09-05 DIAGNOSIS — Z0000 Encounter for general adult medical examination without abnormal findings: Secondary | ICD-10-CM | POA: Diagnosis not present

## 2021-09-05 NOTE — Progress Notes (Signed)
Subjective:   I connected with  Julia Kerr on 09/10/21 by a audio enabled telemedicine application and verified that I am speaking with the correct person using two identifiers.   I discussed the limitations of evaluation and management by telemedicine. The patient expressed understanding and agreed to proceed.   Location of Patient : Home  Location of provider : Provider   Persons participating in visit : Julia Kerr and her son Julia Kerr , Julia Kerr , [place]   Review of Systems    Refer to PCP       Objective:    Today's Vitals   09/05/21 0928 09/05/21 0935  PainSc: 5  5    There is no height or weight on file to calculate BMI.  Advanced Directives 09/10/2021 05/05/2021 01/10/2019  Does Patient Have a Medical Advance Directive? Yes No Yes  Type of Advance Directive [place] - -  Does patient want to make changes to medical advance directive? Yes (ED - Information included in AVS) - No - Patient declined  Copy of [place] in Chart? Yes - validated most recent copy scanned in chart (See row information) - -  Would patient like information on creating a medical advance directive? - No - Patient declined -    Current Medications (verified) Outpatient Encounter Medications as of 09/05/2021  Medication Sig   [DISCONTINUED] Polyethyl Glycol-Propyl Glycol (SYSTANE FREE OP) Apply to eye.   triamcinolone cream (KENALOG) 0.1 % Apply 1 application topically 2 (two) times daily. (Patient not taking: Reported on 09/05/2021)   No facility-administered encounter medications on file as of 09/05/2021.    Allergies (verified) Food   History: No past medical history on file. Past Surgical History:  Procedure Laterality Date   CATARACT EXTRACTION, BILATERAL     SHOULDER SURGERY     No family history on file. Social History   Socioeconomic History   Marital status: Widowed    Spouse name: Not on file   Number of children: Not on  file   Years of education: Not on file   Highest education level: Not on file  Occupational History   Not on file  Tobacco Use   Smoking status: Never   Smokeless tobacco: Never  Vaping Use   Vaping Use: Never used  Substance and Sexual Activity   Alcohol use: Never   Drug use: Never   Sexual activity: Not Currently  Other Topics Concern   Not on file  Social History Narrative   Not on file   Social Determinants of Health   Financial Resource Strain: Not on file  Food Insecurity: Not on file  Transportation Needs: Not on file  Physical Activity: Not on file  Stress: Not on file  Social Connections: Not on file    Tobacco Counseling Counseling given: Not Answered   Clinical Intake:  Pre-visit preparation completed: Yes  Pain : 0-10 Pain Score: 5  Pain Type: Chronic pain Pain Location: Leg Pain Orientation: Right Pain Descriptors / Indicators: Aching Pain Onset: More than a month ago Pain Frequency: Constant     Diabetes: No  How often do you need to have someone help you when you read instructions, pamphlets, or other written materials from your doctor or pharmacy?: 4 - Often  Diabetic?no   Interpreter Needed?: Yes Patient Declined Interpreter : Yes Interpretation services provided by: Family member interpreted visit Patient signed [place] waiver: Yes      Activities of Daily  Living In your present state of health, do you have any difficulty performing the following activities: 09/05/2021 04/10/2021  Hearing? Julia Kerr  Vision? N N  Difficulty concentrating or making decisions? N N  Walking or climbing stairs? Julia Kerr  Comment walking use a cane -  Dressing or bathing? N N  Doing errands, shopping? Y N  Some recent data might be hidden    Patient Care Team: [name], PA-C as PCP - General  Indicate any recent Medical Services you may have received from other than [facility] providers in the past year (date may be approximate).     Assessment:    This is a routine wellness examination for Julia Kerr.  Hearing/Vision screen No results found.  Dietary issues and exercise activities discussed: Current Exercise Habits: Home exercise routine, Type of exercise: walking, Time (Minutes): 20, Frequency (Times/Week): 5, Weekly Exercise (Minutes/Week): 100, Intensity: Mild   Goals Addressed   None    Depression Screen PHQ 2/9 Scores 09/05/2021 04/10/2021 07/21/2020 01/10/2019 12/25/2018 08/17/2018 08/04/2018  PHQ - 2 Score 1 0 0 0 0 0 0  PHQ- 9 Score 3 1 0 - 0 0 0    Fall Risk Fall Risk  09/05/2021 04/10/2021 07/21/2020 01/10/2019 12/25/2018  Falls in the past year? 0 0 0 0 0  Number falls in past yr: 0 - - - -  Injury with Fall? 0 - - - -  Risk for fall due to : - - - - Impaired balance/gait;Impaired mobility  Follow up Falls evaluation completed Falls evaluation completed Falls evaluation completed - Falls evaluation completed    FALL RISK PREVENTION PERTAINING TO THE HOME:  Any stairs in or around the home? No  If so, are there any without handrails? No  Home free of loose throw rugs in walkways, pet beds, electrical cords, etc? No  Adequate lighting in your home to reduce risk of falls? No   ASSISTIVE DEVICES UTILIZED TO PREVENT FALLS:  Life alert? No  Use of a cane, walker or w/c? Yes  Grab bars in the bathroom? Yes  Shower chair or bench in shower? Yes  Elevated toilet seat or a handicapped toilet? No   TIMED UP AND GO:  Was the test performed? n/a.  Length of time to ambulate 10 feet: n/a sec.   Gait unsteady with use of assistive device, provider informed and education provided.  These questions cannot be answered via Telephone encounter. Cognitive Function:     6CIT Screen 09/10/2021 09/05/2021  What Year? 0 points -  What month? 0 points -  What time? 0 points 0 points  Count back from 20 2 points 2 points  Months in reverse 2 points 0 points  Repeat phrase 4 points 2 points  Total Score 8 -    Immunizations Immunization  History  Administered Date(s) Administered   PFIZER SARS-COV-2 Pediatric Vaccination 5-35yrs 04/19/2020    TDAP status: Due, Education has been provided regarding the importance of this vaccine. Advised may receive this vaccine at local pharmacy or Health Dept. Aware to provide a copy of the vaccination record if obtained from local pharmacy or Health Dept. Verbalized acceptance and understanding.  Flu Vaccine status: Due, Education has been provided regarding the importance of this vaccine. Advised may receive this vaccine at local pharmacy or Health Dept. Aware to provide a copy of the vaccination record if obtained from local pharmacy or Health Dept. Verbalized acceptance and understanding.  Pneumococcal vaccine status: Due, Education has been provided regarding  the importance of this vaccine. Advised may receive this vaccine at local pharmacy or Health Dept. Aware to provide a copy of the vaccination record if obtained from local pharmacy or Health Dept. Verbalized acceptance and understanding.  Covid-19 vaccine status: Completed vaccines  Qualifies for Shingles Vaccine?  N/a   Zostavax completed No   Shingrix Completed?: No.    Education has been provided regarding the importance of this vaccine. Patient has been advised to call insurance company to determine out of pocket expense if they have not yet received this vaccine. Advised may also receive vaccine at local pharmacy or Health Dept. Verbalized acceptance and understanding.  Screening Tests Health Maintenance  Topic Date Due   COVID-19 Vaccine (1) 02/14/1924   TETANUS/TDAP  Never done   Zoster Vaccines- Shingrix (1 of 2) Never done   DEXA SCAN  Never done   INFLUENZA VACCINE  Never done   HPV VACCINES  Aged Out    Health Maintenance  Health Maintenance Due  Topic Date Due   COVID-19 Vaccine (1) 02/14/1924   TETANUS/TDAP  Never done   Zoster Vaccines- Shingrix (1 of 2) Never done   DEXA SCAN  Never done   INFLUENZA  VACCINE  Never done    Colorectal cancer screening: No longer required.   Mammogram status: No longer required due to age.  Bone Density status: Ordered n/a. Pt provided with contact info and advised to call to schedule appt.  Lung Cancer Screening: (Low Dose CT Chest recommended if Age 20-80 years, 30 pack-year currently smoking OR have quit w/in 15years.) does not qualify.   Lung Cancer Screening Referral: n/a  Additional Screening:  Hepatitis C Screening: does not qualify; Completed n/a  Vision Screening: Recommended annual ophthalmology exams for early detection of glaucoma and other disorders of the eye. Is the patient up to date with their annual eye exam?  Yes  Who is the provider or what is the name of the office in which the patient attends annual eye exams? [facility]  If pt is not established with a provider, would they like to be referred to a provider to establish care? No .   Dental Screening: Recommended annual dental exams for proper oral hygiene  Community Resource Referral / Chronic Care Management: CRR required this visit?  No   CCM required this visit?  No      Plan:     I have personally reviewed and noted the following in the patient's chart:   Medical and social history Use of alcohol, tobacco or illicit drugs  Current medications and supplements including opioid prescriptions.  Functional ability and status Nutritional status Physical activity Advanced directives List of other physicians Hospitalizations, surgeries, and ER visits in previous 12 months Vitals Screenings to include cognitive, depression, and falls Referrals and appointments  In addition, I have reviewed and discussed with patient certain preventive protocols, quality metrics, and best practice recommendations. A written personalized care plan for preventive services as well as general preventive health recommendations were provided to patient.     Julia Kerr,  [place]   09/10/2021   Nurse Notes: Non-Face 40 minute encounter   Ms. Werden , Thank you for taking time to come for your Medicare Wellness Visit. I appreciate your ongoing commitment to your health goals. Please review the following plan we discussed and let me know if I can assist you in the future.   These are the goals we discussed:  Goals   None  This is a list of the screening recommended for you and due dates:  Health Maintenance  Topic Date Due   COVID-19 Vaccine (1) 02/14/1924   Tetanus Vaccine  Never done   Zoster (Shingles) Vaccine (1 of 2) Never done   DEXA scan (bone density measurement)  Never done   Flu Shot  Never done   HPV Vaccine  Aged Out

## 2021-09-22 ENCOUNTER — Emergency Department (HOSPITAL_COMMUNITY)
Admission: EM | Admit: 2021-09-22 | Discharge: 2021-09-23 | Disposition: A | Discharge status: Home / Self Care | Payer: Medicare Other | Attending: Emergency Medicine | Admitting: Emergency Medicine

## 2021-09-22 ENCOUNTER — Other Ambulatory Visit: Payer: Self-pay

## 2021-09-22 ENCOUNTER — Encounter (HOSPITAL_COMMUNITY): Payer: Self-pay | Admitting: Emergency Medicine

## 2021-09-22 DIAGNOSIS — D649 Anemia, unspecified: Secondary | ICD-10-CM | POA: Diagnosis not present

## 2021-09-22 DIAGNOSIS — N2581 Secondary hyperparathyroidism of renal origin: Secondary | ICD-10-CM | POA: Diagnosis not present

## 2021-09-22 DIAGNOSIS — E46 Unspecified protein-calorie malnutrition: Secondary | ICD-10-CM | POA: Diagnosis not present

## 2021-09-22 DIAGNOSIS — N183 Chronic kidney disease, stage 3 unspecified: Secondary | ICD-10-CM | POA: Diagnosis not present

## 2021-09-22 DIAGNOSIS — D631 Anemia in chronic kidney disease: Secondary | ICD-10-CM | POA: Diagnosis not present

## 2021-09-22 DIAGNOSIS — Z5321 Procedure and treatment not carried out due to patient leaving prior to being seen by health care provider: Secondary | ICD-10-CM | POA: Insufficient documentation

## 2021-09-22 DIAGNOSIS — R799 Abnormal finding of blood chemistry, unspecified: Secondary | ICD-10-CM | POA: Insufficient documentation

## 2021-09-22 DIAGNOSIS — N1832 Chronic kidney disease, stage 3b: Secondary | ICD-10-CM | POA: Diagnosis not present

## 2021-09-22 DIAGNOSIS — K8044 Calculus of bile duct with chronic cholecystitis without obstruction: Secondary | ICD-10-CM | POA: Diagnosis not present

## 2021-09-22 DIAGNOSIS — N189 Chronic kidney disease, unspecified: Secondary | ICD-10-CM | POA: Diagnosis not present

## 2021-09-22 HISTORY — DX: Anemia, unspecified: D64.9

## 2021-09-22 LAB — BASIC METABOLIC PANEL
Anion gap: 10 (ref 5–15)
BUN: 31 mg/dL — ABNORMAL HIGH (ref 8–23)
CO2: 20 mmol/L — ABNORMAL LOW (ref 22–32)
Calcium: 8.7 mg/dL — ABNORMAL LOW (ref 8.9–10.3)
Chloride: 110 mmol/L (ref 98–111)
Creatinine, Ser: 1.52 mg/dL — ABNORMAL HIGH (ref 0.44–1.00)
GFR, Estimated: 31 mL/min — ABNORMAL LOW (ref >60)
Glucose, Bld: 82 mg/dL (ref 70–99)
Potassium: 4.1 mmol/L (ref 3.5–5.1)
Sodium: 140 mmol/L (ref 135–145)

## 2021-09-22 LAB — CBC WITH DIFFERENTIAL/PLATELET
Abs Immature Granulocytes: 0 10*3/uL (ref 0.00–0.07)
Basophils Absolute: 0 10*3/uL (ref 0.0–0.1)
Basophils Relative: 0 %
Eosinophils Absolute: 0 10*3/uL (ref 0.0–0.5)
Eosinophils Relative: 0 %
HCT: 21.5 % — ABNORMAL LOW (ref 36.0–46.0)
Hemoglobin: 6.6 g/dL — CL (ref 12.0–15.0)
Lymphocytes Relative: 12 %
Lymphs Abs: 0.8 10*3/uL (ref 0.7–4.0)
MCH: 31.3 pg (ref 26.0–34.0)
MCHC: 30.7 g/dL (ref 30.0–36.0)
MCV: 101.9 fL — ABNORMAL HIGH (ref 80.0–100.0)
Monocytes Absolute: 0.4 10*3/uL (ref 0.1–1.0)
Monocytes Relative: 5 %
Neutro Abs: 5.8 10*3/uL (ref 1.7–7.7)
Neutrophils Relative %: 83 %
Platelets: 168 10*3/uL (ref 150–400)
RBC: 2.11 MIL/uL — ABNORMAL LOW (ref 3.87–5.11)
RDW: 16.2 % — ABNORMAL HIGH (ref 11.5–15.5)
WBC: 7 10*3/uL (ref 4.0–10.5)
nRBC: 0 % (ref 0.0–0.2)
nRBC: 0 /100 WBC

## 2021-09-22 NOTE — ED Provider Notes (Signed)
Emergency Medicine Provider Triage Evaluation Note  Julia Kerr , a 87 y.o. female  was evaluated in triage.  Pt found to have a hemoglobin of 6.7 at an outpatient clinic.  She was sent here for transfusion.  No symptoms  Review of Systems  Positive:  Negative: Shortness of breath, tachycardia, melena, hematochezia, dizziness  Physical Exam  BP (!) 147/55   Pulse 82   Temp 98.8 F (37.1 C)   Resp 14   SpO2 100%  Gen:   Awake, no distress   Resp:  Normal effort  MSK:   Moves extremities without difficulty  Other:    Medical Decision Making  Medically screening exam initiated at 6:45 PM.  Appropriate orders placed.  Julia Kerr was informed that the remainder of the evaluation will be completed by another provider, this initial triage assessment does not replace that evaluation, and the importance of remaining in the ED until their evaluation is complete.     Julia Kerr 09/22/21 1849    Julia Lower, MD 09/22/21 [phone]

## 2021-09-22 NOTE — ED Triage Notes (Signed)
Pt had labs drawn this morning and told her hgb was 6.7. Denies SOB or weakness, or blood in her stool. Denies blood thinners.

## 2021-09-22 NOTE — ED Notes (Signed)
Pt left. 

## 2021-09-23 ENCOUNTER — Encounter (HOSPITAL_COMMUNITY): Payer: Self-pay

## 2021-09-23 ENCOUNTER — Emergency Department (HOSPITAL_COMMUNITY)
Admission: EM | Admit: 2021-09-23 | Discharge: 2021-09-23 | Disposition: A | Discharge status: Home / Self Care | Payer: Medicare Other | Source: Home / Self Care | Attending: Emergency Medicine | Admitting: Emergency Medicine

## 2021-09-23 DIAGNOSIS — N183 Chronic kidney disease, stage 3 unspecified: Secondary | ICD-10-CM | POA: Insufficient documentation

## 2021-09-23 DIAGNOSIS — D649 Anemia, unspecified: Secondary | ICD-10-CM

## 2021-09-23 DIAGNOSIS — H04123 Dry eye syndrome of bilateral lacrimal glands: Secondary | ICD-10-CM | POA: Diagnosis not present

## 2021-09-23 LAB — RETICULOCYTES
Immature Retic Fract: 13.5 % (ref 2.3–15.9)
RBC.: 2.1 MIL/uL — ABNORMAL LOW (ref 3.87–5.11)
Retic Count, Absolute: 90.3 10*3/uL (ref 19.0–186.0)
Retic Ct Pct: 4.3 % — ABNORMAL HIGH (ref 0.4–3.1)

## 2021-09-23 LAB — CBC WITH DIFFERENTIAL/PLATELET
Basophils Absolute: 0 10*3/uL (ref 0.0–0.1)
Basophils Relative: 0 %
Eosinophils Absolute: 0.1 10*3/uL (ref 0.0–0.5)
Eosinophils Relative: 1 %
HCT: 21.7 % — ABNORMAL LOW (ref 36.0–46.0)
Hemoglobin: 6.6 g/dL — CL (ref 12.0–15.0)
Lymphocytes Relative: 12 %
Lymphs Abs: 0.9 10*3/uL (ref 0.7–4.0)
MCH: 31 pg (ref 26.0–34.0)
MCHC: 30.4 g/dL (ref 30.0–36.0)
MCV: 101.9 fL — ABNORMAL HIGH (ref 80.0–100.0)
Monocytes Absolute: 0.7 10*3/uL (ref 0.1–1.0)
Monocytes Relative: 10 %
Neutro Abs: 5.5 10*3/uL (ref 1.7–7.7)
Neutrophils Relative %: 77 %
Platelets: 173 10*3/uL (ref 150–400)
RBC: 2.13 MIL/uL — ABNORMAL LOW (ref 3.87–5.11)
RDW: 15.9 % — ABNORMAL HIGH (ref 11.5–15.5)
WBC: 7.2 10*3/uL (ref 4.0–10.5)
nRBC: 0 % (ref 0.0–0.2)

## 2021-09-23 LAB — BASIC METABOLIC PANEL
Anion gap: 10 (ref 5–15)
BUN: 30 mg/dL — ABNORMAL HIGH (ref 8–23)
CO2: 19 mmol/L — ABNORMAL LOW (ref 22–32)
Calcium: 8.6 mg/dL — ABNORMAL LOW (ref 8.9–10.3)
Chloride: 108 mmol/L (ref 98–111)
Creatinine, Ser: 1.42 mg/dL — ABNORMAL HIGH (ref 0.44–1.00)
GFR, Estimated: 34 mL/min — ABNORMAL LOW (ref >60)
Glucose, Bld: 76 mg/dL (ref 70–99)
Potassium: 4.2 mmol/L (ref 3.5–5.1)
Sodium: 137 mmol/L (ref 135–145)

## 2021-09-23 LAB — VITAMIN B12: Vitamin B-12: 555 pg/mL (ref 180–914)

## 2021-09-23 LAB — PREPARE RBC (CROSSMATCH)

## 2021-09-23 LAB — IRON AND TIBC
Iron: 65 ug/dL (ref 28–170)
Saturation Ratios: 27 % (ref 10.4–31.8)
TIBC: 245 ug/dL — ABNORMAL LOW (ref 250–450)
UIBC: 180 ug/dL

## 2021-09-23 LAB — FOLATE: Folate: 12.4 ng/mL (ref >5.9)

## 2021-09-23 LAB — FERRITIN: Ferritin: 1059 ng/mL — ABNORMAL HIGH (ref 11–307)

## 2021-09-23 MED ORDER — SODIUM CHLORIDE 0.9 % IV SOLN
10.0000 mL/h | Freq: Once | INTRAVENOUS | Status: AC
  Administered 2021-09-23: 10 mL/h via INTRAVENOUS

## 2021-09-23 NOTE — ED Triage Notes (Signed)
Pt had labs drawn at nephrologist office and was told her hgb was 6.7. Pt has no complaints, denies losing blood from anywhere. Denies Sob or weakness. Pt normally recieves iron infusions monthly but missed the last few months. Pt a.o, ambulatory

## 2021-09-23 NOTE — ED Provider Notes (Signed)
[facility] EMERGENCY DEPARTMENT Provider Note   CSN: [id number] Arrival date & time: 09/23/21  1139     History Chief Complaint  Patient presents with   Anemia    Julia Kerr is a 87 y.o. female.   Anemia Pertinent negatives include no chest pain, no abdominal pain and no shortness of breath. Patient sent in reported by nephrology for anemia.  Patient is without complaints.  Patient's son does the translating.  Not want Child psychotherapist.  Has had a history of anemia.  Had previously been on iron transfusions.  Reportedly has not had in over 6 months.  Nephrology drew some blood work without hemoglobin in the sixes.  Son states hemoglobin has been drifting down for a while.  Patient without complaints.  Feels fine.  No lightheadedness or dizziness.  No shortness of breath.  No difficulty with exertion.  No blood in the stool or black stools.     Past Medical History:  Diagnosis Date   Anemia     Patient Active Problem List   Diagnosis Date Noted   Seasonal allergic rhinitis 04/12/2021   Left sided sciatica 04/12/2021   Chronic rhinitis 02/06/2020   Adverse food reaction 02/06/2020   Vaccine counseling 02/06/2020   Cellulitis of lower extremity 08/18/2019   Insect bite 08/01/2019   Low serum thyroid stimulating hormone (TSH) 08/17/2018   Normocytic normochromic anemia 08/17/2018   CKD (chronic kidney disease), stage III (HCC) 08/17/2018   Dehydration, moderate 08/17/2018   Low HDL (under 40) 08/17/2018   Vitamin D deficiency 08/04/2018   B12 deficiency-requiring injections in past 08/04/2018   Bruit of right carotid artery 08/04/2018   Choledocholithiasis with chronic cholecystitis- not amenable to surgery due to age 17/49/4696   Biliary colic symptom- intolerant to dairy and fats due to gallstone presence 08/04/2018   Environmental and seasonal allergies 08/04/2018   Low weight for height- bmi 17.5 08/04/2018   Postmenopausal bone loss  08/04/2018   Chronic fatigue- with B12 def 08/04/2018    Past Surgical History:  Procedure Laterality Date   CATARACT EXTRACTION, BILATERAL     SHOULDER SURGERY       OB History   No obstetric history on file.     No family history on file.  Social History   Tobacco Use   Smoking status: Never   Smokeless tobacco: Never  Vaping Use   Vaping Use: Never used  Substance Use Topics   Alcohol use: Never   Drug use: Never    Home Medications Prior to Admission medications   Medication Sig Start Date End Date Taking? Authorizing Provider  acetaminophen (TYLENOL) 500 MG tablet Take 500 mg by mouth every 6 (six) hours as needed for mild pain.   Yes [provider]  Polyethylene Glycol 400 (VISINE DRY EYE RELIEF OP) Place 1 drop into both eyes as needed (dry eyes).   Yes [provider]  triamcinolone cream (KENALOG) 0.1 % Apply 1 application topically 2 (two) times daily. Patient not taking: No sig reported 05/15/21   Wieters, [employer] C, PA-C    Allergies    Food  Review of Systems   Review of Systems  Constitutional:  Negative for appetite change.  HENT:  Negative for congestion.   Respiratory:  Negative for shortness of breath.   Cardiovascular:  Negative for chest pain.  Gastrointestinal:  Negative for abdominal pain and blood in stool.  Genitourinary:  Negative for dysuria.  Musculoskeletal:  Negative for back pain.  Skin:  Positive for pallor.  Neurological:  Negative for weakness.  Psychiatric/Behavioral:  Negative for confusion.    Physical Exam Updated Vital Signs BP (!) 130/53   Pulse 68   Temp 98.3 F (36.8 C) (Oral)   Resp 19   SpO2 100%   Physical Exam Vitals reviewed.  Constitutional:      Appearance: Normal appearance.  HENT:     Head: Normocephalic.  Eyes:     Pupils: Pupils are equal, round, and reactive to light.  Cardiovascular:     Rate and Rhythm: Normal rate and regular rhythm.  Pulmonary:     Breath sounds: No  wheezing or rhonchi.  Abdominal:     Tenderness: There is no abdominal tenderness.  Musculoskeletal:        General: No tenderness.     Cervical back: Neck supple.  Skin:    Capillary Refill: Capillary refill takes less than 2 seconds.     Coloration: Skin is pale.  Neurological:     General: No focal deficit present.     Mental Status: She is alert.    ED Results / Procedures / Treatments   Labs (all labs ordered are listed, but only abnormal results are displayed) Labs Reviewed  CBC WITH DIFFERENTIAL/PLATELET - Abnormal; Notable for the following components:      Result Value   RBC 2.13 (*)    Hemoglobin 6.6 (*)    HCT 21.7 (*)    MCV 101.9 (*)    RDW 15.9 (*)    All other components within normal limits  IRON AND TIBC - Abnormal; Notable for the following components:   TIBC 245 (*)    All other components within normal limits  FERRITIN - Abnormal; Notable for the following components:   Ferritin 1,059 (*)    All other components within normal limits  RETICULOCYTES - Abnormal; Notable for the following components:   Retic Ct Pct 4.3 (*)    RBC. 2.10 (*)    All other components within normal limits  BASIC METABOLIC PANEL - Abnormal; Notable for the following components:   CO2 19 (*)    BUN 30 (*)    Creatinine, Ser 1.42 (*)    Calcium 8.6 (*)    GFR, Estimated 34 (*)    All other components within normal limits  VITAMIN B12  FOLATE  POC OCCULT BLOOD, ED  TYPE AND SCREEN  PREPARE RBC (CROSSMATCH)    EKG None  Radiology No results found.  Procedures Procedures   Medications Ordered in ED Medications  0.9 %  sodium chloride infusion (has no administration in time range)    ED Course  I have reviewed the triage vital signs and the nursing notes.  Pertinent labs & imaging results that were available during my care of the patient were reviewed by me and considered in my medical decision making (see chart for details).    MDM Rules/Calculators/A&P                            Patient with anemia.  History of same.  Has been off her iron transfusions for months.  She appears to be asymptomatic but hemoglobin is 6.7.  With minimal symptoms I feels patient benefit from transfusion but just 1 unit and likely discharge back home.  Can follow-up as an outpatient with PCP.  Anemia labs done.  Can be worked up further by PCP.  Care turned over to Dr.  Messick.  CRITICAL CARE Performed by: Davonna Belling Total critical care time: 30 minutes Critical care time was exclusive of separately billable procedures and treating other patients. Critical care was necessary to treat or prevent imminent or life-threatening deterioration. Critical care was time spent personally by me on the following activities: development of treatment plan with patient and/or surrogate as well as nursing, discussions with consultants, evaluation of patient's response to treatment, examination of patient, obtaining history from patient or surrogate, ordering and performing treatments and interventions, ordering and review of laboratory studies, ordering and review of radiographic studies, pulse oximetry and re-evaluation of patient's condition.  Final Clinical Impression(s) / ED Diagnoses Final diagnoses:  Anemia, unspecified type    Rx / DC Orders ED Discharge Orders     None        Davonna Belling, MD 09/23/21 1559

## 2021-09-23 NOTE — ED Notes (Signed)
MD notified of PRBC completion.

## 2021-09-23 NOTE — ED Notes (Signed)
Help get patient on the monitor into a gown patient is resting with family at bedside

## 2021-09-23 NOTE — ED Provider Notes (Signed)
Seen after prior EDP.  Patient's son is at bedside.  The blood transfusion is complete.  The patient remains comfortable.  The patient and the patient's son understand need for close follow-up with her regular care providers in the outpatient setting.  The patient's son and the patient are without any questions at this time.  They understand need for close follow-up.  Strict return precautions given and understood.   Valarie Merino, MD 09/23/21 2004

## 2021-09-23 NOTE — ED Provider Notes (Signed)
Emergency Medicine Provider Triage Evaluation Note  Julia Kerr , a 87 y.o. female  was evaluated in triage.  Pt complains of anemia.  She had labs drawn recently and was found to be anemic and sent here for a transfusion.  She has no complaints.  She used to get iron infusions but missed about 6 months worth.   Review of Systems  Positive: Anemia Negative: Light headed, syncope, rectal bleeding  Physical Exam  There were no vitals taken for this visit. Gen:   Awake, no distress   Resp:  Normal effort  MSK:   Moves extremities without difficulty  Other:  Awake and alert.   Medical Decision Making  Medically screening exam initiated at 12:27 PM.  Appropriate orders placed.  Julia Kerr was informed that the remainder of the evaluation will be completed by another provider, this initial triage assessment does not replace that evaluation, and the importance of remaining in the ED until their evaluation is complete.     Ollen Gross 09/23/21 1229    Davonna Belling, MD 09/23/21 [phone]

## 2021-09-23 NOTE — Discharge Instructions (Addendum)
Return for any problem.  ?

## 2021-09-24 ENCOUNTER — Other Ambulatory Visit: Payer: Self-pay

## 2021-09-24 ENCOUNTER — Ambulatory Visit (INDEPENDENT_AMBULATORY_CARE_PROVIDER_SITE_OTHER): Payer: Medicare Other | Admitting: Physician Assistant

## 2021-09-24 ENCOUNTER — Encounter: Payer: Self-pay | Admitting: Physician Assistant

## 2021-09-24 VITALS — BP 157/76 | HR 86 | Temp 98.7°F | Ht 59.0 in | Wt 83.0 lb

## 2021-09-24 DIAGNOSIS — J302 Other seasonal allergic rhinitis: Secondary | ICD-10-CM | POA: Diagnosis not present

## 2021-09-24 DIAGNOSIS — N1832 Chronic kidney disease, stage 3b: Secondary | ICD-10-CM | POA: Diagnosis not present

## 2021-09-24 DIAGNOSIS — D631 Anemia in chronic kidney disease: Secondary | ICD-10-CM | POA: Diagnosis not present

## 2021-09-24 DIAGNOSIS — D649 Anemia, unspecified: Secondary | ICD-10-CM

## 2021-09-24 DIAGNOSIS — L299 Pruritus, unspecified: Secondary | ICD-10-CM | POA: Diagnosis not present

## 2021-09-24 LAB — BPAM RBC
Blood Product Expiration Date: 202210252359
ISSUE DATE / TIME: 202210051647
Unit Type and Rh: 6200

## 2021-09-24 LAB — TYPE AND SCREEN
ABO/RH(D): A POS
ABO/RH(D): A POS
Antibody Screen: NEGATIVE
Antibody Screen: NEGATIVE
Unit division: 0

## 2021-09-24 MED ORDER — TRIAMCINOLONE ACETONIDE 55 MCG/ACT NA AERO: 1.0000 | INHALATION_SPRAY | Freq: Two times a day (BID) | NASAL | 6 refills | Status: DC

## 2021-09-24 NOTE — Progress Notes (Signed)
Established Patient Office Visit  Subjective:  Patient ID: Julia Kerr, female    DOB: [birth year]  Age: 87 y.o. MRN: [id number]  CC:  Chief Complaint  Patient presents with   Hospitalization Follow-up    HPI [facility] presents for follow up on anemia.  Patient is accompanied by her son.  Patient was referred to the ED yesterday for a blood transfusion by nephrology due to low hemoglobin in the 6 range.  Patient denies shortness of breath, dizziness, chest pain, syncope, dark or bloody stools.  States with the weather changing has been having a runny nose.  Has used a nasal spray (unable to recall which nasal spray) which irritated her nose and made her eyes watery so has not been using. Also c/o itchy skin especially lower legs which has been ongoing issue for quite some time.  Past Medical History:  Diagnosis Date   Anemia     Past Surgical History:  Procedure Laterality Date   CATARACT EXTRACTION, BILATERAL     SHOULDER SURGERY      History reviewed. No pertinent family history.  Social History   Socioeconomic History   Marital status: Widowed    Spouse name: Not on file   Number of children: Not on file   Years of education: Not on file   Highest education level: Not on file  Occupational History   Not on file  Tobacco Use   Smoking status: Never   Smokeless tobacco: Never  Vaping Use   Vaping Use: Never used  Substance and Sexual Activity   Alcohol use: Never   Drug use: Never   Sexual activity: Not Currently  Other Topics Concern   Not on file  Social History Narrative   Not on file   Social Determinants of Health   Financial Resource Strain: Not on file  Food Insecurity: Not on file  Transportation Needs: Not on file  Physical Activity: Not on file  Stress: Not on file  Social Connections: Not on file  Intimate Partner Violence: Not on file    Outpatient Medications Prior to Visit  Medication Sig Dispense Refill   acetaminophen (TYLENOL) 500  MG tablet Take 500 mg by mouth every 6 (six) hours as needed for mild pain.     Polyethylene Glycol 400 (VISINE DRY EYE RELIEF OP) Place 1 drop into both eyes as needed (dry eyes).     triamcinolone cream (KENALOG) 0.1 % Apply 1 application topically 2 (two) times daily. (Patient not taking: No sig reported) 45 g 0   No facility-administered medications prior to visit.    Allergies  Allergen Reactions   Food Other (See Comments)    Apples and strawberries cause burning of lips    ROS Review of Systems Review of Systems:  A fourteen system review of systems was performed and found to be positive as per HPI.   Objective:    Physical Exam General:  Pleasant and cooperative, in no acute distress Neuro:  Alert and oriented,  extra-ocular muscles intact  HEENT:  Normocephalic, atraumatic, neck supple  Skin:  no gross rash, warm, ecchymosis noted of right arm. Cardiac:  RRR, S1 S2 Respiratory: CTA B/L w/o wheezing, crackles or rales, Not using accessory muscles, speaking in full sentences- unlabored. Vascular:  Ext warm, no cyanosis apprec.; cap RF less 2 sec. Psych:  No HI/SI, judgement and insight good, Euthymic mood. Full Affect.  BP (!) 157/76   Pulse 86   Temp 98.7 F (37.1 C)  Ht 4' 11"  (1.499 m)   Wt 83 lb (37.6 kg)   SpO2 100%   BMI 16.76 kg/m  Wt Readings from Last 3 Encounters:  09/24/21 83 lb (37.6 kg)  04/10/21 84 lb 8 oz (38.3 kg)  12/03/20 82 lb (37.2 kg)     Health Maintenance Due  Topic Date Due   COVID-19 Vaccine (1) 02/14/1924   TETANUS/TDAP  Never done   Zoster Vaccines- Shingrix (1 of 2) Never done   DEXA SCAN  Never done   INFLUENZA VACCINE  Never done    There are no preventive care reminders to display for this patient.  Lab Results  Component Value Date   TSH 0.341 (L) 07/21/2020   Lab Results  Component Value Date   WBC 10.1 09/24/2021   HGB 8.9 (L) 09/24/2021   HCT 27.0 (L) 09/24/2021   MCV 92 09/24/2021   PLT 186 09/24/2021    Lab Results  Component Value Date   NA 140 09/24/2021   K 4.9 09/24/2021   CO2 19 (L) 09/24/2021   GLUCOSE 89 09/24/2021   BUN 31 09/24/2021   CREATININE 1.42 (H) 09/24/2021   BILITOT 1.2 09/24/2021   ALKPHOS 120 09/24/2021   AST 28 09/24/2021   ALT 15 09/24/2021   PROT 6.9 09/24/2021   ALBUMIN 4.8 (H) 09/24/2021   CALCIUM 8.7 09/24/2021   ANIONGAP 10 09/23/2021   EGFR 34 (L) 09/24/2021   GFR 40.98 (L) 12/03/2020   Lab Results  Component Value Date   CHOL 78 (L) 07/21/2020   Lab Results  Component Value Date   HDL 28 (L) 07/21/2020   Lab Results  Component Value Date   LDLCALC 33 07/21/2020   Lab Results  Component Value Date   TRIG 82 07/21/2020   Lab Results  Component Value Date   CHOLHDL 2.8 07/21/2020   Lab Results  Component Value Date   HGBA1C 4.7 (L) 07/21/2020      Assessment & Plan:   Problem List Items Addressed This Visit       Respiratory   Seasonal allergic rhinitis   Relevant Medications   triamcinolone (NASACORT) 55 MCG/ACT AERO nasal inhaler   Other Visit Diagnoses     Anemia due to stage 3b chronic kidney disease ([place])    -  Primary   Relevant Orders   Ambulatory referral to Hematology / Oncology   CBC w/Diff (Completed)   Comp Met (CMET) (Completed)   Skin pruritus       Relevant Orders   Bile acids, total (Completed)      Anemia: -Reviewed ED notes, labs and/or imaging. CBC- hemoglobin 6.6, hematocrit 21.7, MCV 101.9, MCH 31.0 -Patient received 1 unit of RBCs. Will repeat CBC to monitor hemoglobin and hematocrit. -Anemia is likely secondary to CKD (anemia of chronic diease). Will place referral to hematology for further evaluation and management.  Seasonal allergic rhinitis: -Recommend to trial different nasal spray, will send rx for triamcinolone.  Skin pruritus: -Etiology unclear. Possibly related to stage III CKD. Patient also has cholelithiasis and is managing conservatively, will place lab order for bile  salts.   Meds ordered this encounter  Medications   triamcinolone (NASACORT) 55 MCG/ACT AERO nasal inhaler    Sig: Place 1 spray into the nose 2 (two) times daily.    Dispense:  1 each    Refill:  6    Order Specific Question:   Supervising Provider    Answer:   Beatrice Lecher D [[id number]]  Follow-up: Return in about 2 months (around 11/24/2021) for [place] and [place].    [name], PA-C

## 2021-09-24 NOTE — Patient Instructions (Signed)
Anemia Anemia is a condition in which there is not enough red blood cells or hemoglobin in the blood. Hemoglobin is a substance in red blood cells that carries oxygen. When you do not have enough red blood cells or hemoglobin (are anemic), your body cannot get enough oxygen and your organs may not work properly. As a result, you may feel very tired or have other problems. What are the causes? Common causes of anemia include: Excessive bleeding. Anemia can be caused by excessive bleeding inside or outside the body, including bleeding from the intestines or from heavy menstrual periods in females. Poor nutrition. Long-lasting (chronic) kidney, thyroid, and liver disease. Bone marrow disorders, spleen problems, and blood disorders. Cancer and treatments for cancer. HIV (human immunodeficiency virus) and AIDS (acquired immunodeficiency syndrome). Infections, medicines, and autoimmune disorders that destroy red blood cells. What are the signs or symptoms? Symptoms of this condition include: Minor weakness. Dizziness. Headache, or difficulties concentrating and sleeping. Heartbeats that feel irregular or faster than normal (palpitations). Shortness of breath, especially with exercise. Pale skin, lips, and nails, or cold hands and feet. Indigestion and nausea. Symptoms may occur suddenly or develop slowly. If your anemia is mild, you may not have symptoms. How is this diagnosed? This condition is diagnosed based on blood tests, your medical history, and a physical exam. In some cases, a test may be needed in which cells are removed from the soft tissue inside of a bone and looked at under a microscope (bone marrow biopsy). Your health care provider may also check your stool (feces) for blood and may do additional testing to look for the cause of your bleeding. Other tests may include: Imaging tests, such as a CT scan or MRI. A procedure to see inside your esophagus and stomach (endoscopy). A  procedure to see inside your colon and rectum (colonoscopy). How is this treated? Treatment for this condition depends on the cause. If you continue to lose a lot of blood, you may need to be treated at a hospital. Treatment may include: Taking supplements of iron, vitamin B12, or folic acid. Taking a hormone medicine (erythropoietin) that can help to stimulate red blood cell growth. Having a blood transfusion. This may be needed if you lose a lot of blood. Making changes to your diet. Having surgery to remove your spleen. Follow these instructions at home: Take over-the-counter and prescription medicines only as told by your health care provider. Take supplements only as told by your health care provider. Follow any diet instructions that you were given by your health care provider. Keep all follow-up visits as told by your health care provider. This is important. Contact a health care provider if: You develop new bleeding anywhere in the body. Get help right away if: You are very weak. You are short of breath. You have pain in your abdomen or chest. You are dizzy or feel faint. You have trouble concentrating. You have bloody stools, black stools, or tarry stools. You vomit repeatedly or you vomit up blood. These symptoms may represent a serious problem that is an emergency. Do not wait to see if the symptoms will go away. Get medical help right away. Call your local emergency services (911 in the [place] Do not drive yourself to the hospital. Summary Anemia is a condition in which you do not have enough red blood cells or enough of a substance in your red blood cells that carries oxygen (hemoglobin). Symptoms may occur suddenly or develop slowly. If your anemia is   mild, you may not have symptoms. This condition is diagnosed with blood tests, a medical history, and a physical exam. Other tests may be needed. Treatment for this condition depends on the cause of the anemia. This  information is not intended to replace advice given to you by your health care provider. Make sure you discuss any questions you have with your health care provider. Document Revised: 11/13/2019 Document Reviewed: 11/13/2019 Elsevier Patient Education  2022 Elsevier Inc.  

## 2021-09-25 ENCOUNTER — Telehealth: Payer: Self-pay | Admitting: Hematology

## 2021-09-25 LAB — COMPREHENSIVE METABOLIC PANEL
ALT: 15 IU/L (ref 0–32)
AST: 28 IU/L (ref 0–40)
Albumin/Globulin Ratio: 2.3 — ABNORMAL HIGH (ref 1.2–2.2)
Albumin: 4.8 g/dL — ABNORMAL HIGH (ref 3.5–4.6)
Alkaline Phosphatase: 120 IU/L (ref 44–121)
BUN/Creatinine Ratio: 22 (ref 12–28)
BUN: 31 mg/dL (ref 10–36)
Bilirubin Total: 1.2 mg/dL (ref 0.0–1.2)
CO2: 19 mmol/L — ABNORMAL LOW (ref 20–29)
Calcium: 8.7 mg/dL (ref 8.7–10.3)
Chloride: 105 mmol/L (ref 96–106)
Creatinine, Ser: 1.42 mg/dL — ABNORMAL HIGH (ref 0.57–1.00)
Globulin, Total: 2.1 g/dL (ref 1.5–4.5)
Glucose: 89 mg/dL (ref 70–99)
Potassium: 4.9 mmol/L (ref 3.5–5.2)
Sodium: 140 mmol/L (ref 134–144)
Total Protein: 6.9 g/dL (ref 6.0–8.5)
eGFR: 34 mL/min/{1.73_m2} — ABNORMAL LOW (ref >59)

## 2021-09-25 LAB — CBC WITH DIFFERENTIAL/PLATELET
Basophils Absolute: 0 10*3/uL (ref 0.0–0.2)
Basos: 0 %
EOS (ABSOLUTE): 0 10*3/uL (ref 0.0–0.4)
Eos: 0 %
Hematocrit: 27 % — ABNORMAL LOW (ref 34.0–46.6)
Hemoglobin: 8.9 g/dL — ABNORMAL LOW (ref 11.1–15.9)
Immature Grans (Abs): 0.2 10*3/uL — ABNORMAL HIGH (ref 0.0–0.1)
Immature Granulocytes: 2 %
Lymphocytes Absolute: 1.2 10*3/uL (ref 0.7–3.1)
Lymphs: 12 %
MCH: 30.4 pg (ref 26.6–33.0)
MCHC: 33 g/dL (ref 31.5–35.7)
MCV: 92 fL (ref 79–97)
Monocytes Absolute: 2.4 10*3/uL — ABNORMAL HIGH (ref 0.1–0.9)
Monocytes: 24 %
Neutrophils Absolute: 6.3 10*3/uL (ref 1.4–7.0)
Neutrophils: 62 %
Platelets: 186 10*3/uL (ref 150–450)
RBC: 2.93 x10E6/uL — ABNORMAL LOW (ref 3.77–5.28)
RDW: 16.1 % — ABNORMAL HIGH (ref 11.7–15.4)
WBC: 10.1 10*3/uL (ref 3.4–10.8)

## 2021-09-25 LAB — BILE ACIDS, TOTAL: Bile Acids Total: 43.6 umol/L — ABNORMAL HIGH (ref 0.0–10.0)

## 2021-09-25 NOTE — Telephone Encounter (Signed)
Scheduled appt per 10/7 referral. Pt's son is aware of appt date and time.

## 2021-09-29 ENCOUNTER — Telehealth: Payer: Self-pay | Admitting: Hematology and Oncology

## 2021-09-29 NOTE — Telephone Encounter (Signed)
R/s appt per pt's son's request. They needed an earlier tiime. R/s appt for an earlier time and let pt's son know.

## 2021-09-30 ENCOUNTER — Inpatient Hospital Stay: Payer: Medicare Other | Attending: Hematology | Admitting: Hematology and Oncology

## 2021-09-30 ENCOUNTER — Encounter: Payer: Medicare Other | Admitting: Hematology

## 2021-09-30 ENCOUNTER — Other Ambulatory Visit: Payer: Self-pay

## 2021-09-30 ENCOUNTER — Inpatient Hospital Stay: Payer: Medicare Other

## 2021-09-30 VITALS — BP 139/49 | HR 79 | Temp 97.8°F | Resp 17 | Wt 82.0 lb

## 2021-09-30 DIAGNOSIS — N1832 Chronic kidney disease, stage 3b: Secondary | ICD-10-CM

## 2021-09-30 DIAGNOSIS — D469 Myelodysplastic syndrome, unspecified: Secondary | ICD-10-CM | POA: Insufficient documentation

## 2021-09-30 LAB — CBC WITH DIFFERENTIAL (CANCER CENTER ONLY)
Abs Immature Granulocytes: 0.11 10*3/uL — ABNORMAL HIGH (ref 0.00–0.07)
Basophils Absolute: 0 10*3/uL (ref 0.0–0.1)
Basophils Relative: 0 %
Eosinophils Absolute: 0 10*3/uL (ref 0.0–0.5)
Eosinophils Relative: 0 %
HCT: 24.8 % — ABNORMAL LOW (ref 36.0–46.0)
Hemoglobin: 7.9 g/dL — ABNORMAL LOW (ref 12.0–15.0)
Immature Granulocytes: 2 %
Lymphocytes Relative: 15 %
Lymphs Abs: 1 10*3/uL (ref 0.7–4.0)
MCH: 30.6 pg (ref 26.0–34.0)
MCHC: 31.9 g/dL (ref 30.0–36.0)
MCV: 96.1 fL (ref 80.0–100.0)
Monocytes Absolute: 1.5 10*3/uL — ABNORMAL HIGH (ref 0.1–1.0)
Monocytes Relative: 22 %
Neutro Abs: 4.4 10*3/uL (ref 1.7–7.7)
Neutrophils Relative %: 61 %
Platelet Count: 163 10*3/uL (ref 150–400)
RBC: 2.58 MIL/uL — ABNORMAL LOW (ref 3.87–5.11)
RDW: 15.9 % — ABNORMAL HIGH (ref 11.5–15.5)
Smear Review: NORMAL
WBC Count: 7 10*3/uL (ref 4.0–10.5)
nRBC: 0 % (ref 0.0–0.2)

## 2021-09-30 LAB — CMP (CANCER CENTER ONLY)
ALT: 21 U/L (ref 0–44)
AST: 31 U/L (ref 15–41)
Albumin: 4.4 g/dL (ref 3.5–5.0)
Alkaline Phosphatase: 89 U/L (ref 38–126)
Anion gap: 8 (ref 5–15)
BUN: 41 mg/dL — ABNORMAL HIGH (ref 8–23)
CO2: 22 mmol/L (ref 22–32)
Calcium: 9.3 mg/dL (ref 8.9–10.3)
Chloride: 114 mmol/L — ABNORMAL HIGH (ref 98–111)
Creatinine: 1.36 mg/dL — ABNORMAL HIGH (ref 0.44–1.00)
GFR, Estimated: 36 mL/min — ABNORMAL LOW (ref >60)
Glucose, Bld: 94 mg/dL (ref 70–99)
Potassium: 4.3 mmol/L (ref 3.5–5.1)
Sodium: 144 mmol/L (ref 135–145)
Total Bilirubin: 1 mg/dL (ref 0.3–1.2)
Total Protein: 7.1 g/dL (ref 6.5–8.1)

## 2021-09-30 LAB — LACTATE DEHYDROGENASE: LDH: 394 U/L — ABNORMAL HIGH (ref 98–192)

## 2021-09-30 LAB — FOLATE: Folate: 12 ng/mL (ref >5.9)

## 2021-09-30 LAB — SAMPLE TO BLOOD BANK

## 2021-09-30 LAB — VITAMIN B12: Vitamin B-12: 604 pg/mL (ref 180–914)

## 2021-09-30 NOTE — Patient Instructions (Signed)
Thank you for choosing [facility] to provide your care.   Should you have questions after your visit to the [facility] ([facility]), please contact this office at [phone] between 8:30 AM and 4:30 PM.  Voice mails left after 4:00 PM may not be returned until the following business day.  Calls received after 4:30 PM will be answered by an off-site Nurse Triage Line.    Prescription Refills:  Please have your pharmacy contact us directly for most prescription requests.  Contact the office directly for refills of narcotics (pain medications). Allow 48-72 hours for refills.  Appointments: Please contact the [facility] scheduling department [phone] for questions regarding [facility] appointment scheduling.  Contact the schedulers with any scheduling changes so that your appointment can be rescheduled in a timely manner.   Central Scheduling for [place] [phone] - Call to schedule procedures such as PET scans, CT scans, MRI, Ultrasound, etc.  To afford each patient quality time with our providers, please arrive 30 minutes before your scheduled appointment time.  If you arrive late for your appointment, you may be asked to reschedule.  We strive to give you quality time with our providers, and arriving late affects you and other patients whose appointments are after yours. If you are a no show for multiple scheduled visits, you may be dismissed from the clinic at the providers discretion.     Resources: [facility] Social Workers [phone] for additional information on assistance programs or assistance connecting with community support programs   [place] DSS  336-641-3447: Information regarding food stamps, Medicaid, and utility assistance [place] [employer] [place] 336-333-6589   [place] [employer]'s shared-ride transportation service for eligible riders who have a disability that prevents them from riding the fixed route bus.   Medicare Rights Center 800-333-4114  Helps people with Medicare understand their rights and benefits, navigate the Medicare system, and secure the quality healthcare they deserve [employer] 800-227-2345 Assists patients locate various types of support and financial assistance Cancer Care: 1-800-813-HOPE (4673) Provides financial assistance, online support groups, medication/co-pay assistance.   Transportation Assistance for appointments at [facility]: Transportation Coordinator [phone]  Again, thank you for choosing [facility] for your care.       

## 2021-09-30 NOTE — Progress Notes (Signed)
[place] [phone]   Fax:([phone]) [place] NOTE  Patient Care Team: [name], Hershal Coria as PCP - General  Hematological/Oncological History # Normocytic Anemia 08/04/2018: WBC 4.2, Hgb 9.4, MCV 89, Plt 264 05/05/2021: WBC 12.2, Hgb 8.9, MCV 94.9, Plt 229 09/23/2021: WBC 7.2, Hgb 6.6, MCV 101.9, Plt 173. Ferritin 1059, TIBC 245, Iron sat 27%. Retc 4.3% 09/24/2021: WBC 10.1, Hgb 8.9, MCV 92, Plt 186 09/30/2021: establish care with Dr. Lorenso Courier   CHIEF COMPLAINTS/PURPOSE OF CONSULTATION:  "Normocytic Anemia "  HISTORY OF PRESENTING ILLNESS:  Julia Kerr 87 y.o. female with medical history significant for CKD stage IIIb who presents for evaluation of anemia.  On review of the previous records Julia Kerr has a longstanding history of anemia dating back to at least 08/04/2018.  At that time she was noted to have a hemoglobin 9.4.  On 05/05/2021 the patient did have a hemoglobin of 8.9 and unfortunately dropped down to 6.6 on 09/23/2021.  At the time she was noted to have a ferritin of 1059, total iron binding [place] to 45, iron saturation of 27%, and a reticulocyte of 4.3%.  Most recently on 09/24/2021 the patient not have a hemoglobin of 8.9.  Due to concern for these findings the patient was referred to hematology for further evaluation management.  On exam today [name] is accompanied by her son who helps with some of the language barrier.  She reports that she has been having difficulty with arm pain and pain in her shoulders.  She notes that she previously received erythropoietin shots on a regular basis, however has not received any since April 2022.  She notes that she is not having any issues with shortness of breath and reports her energy is quite good with no fatigue.  At night she does have some occasional itching of her skin.  She notes that she does not eat any red meat in her diet.  On further discussion she has that she is a never  smoker and does not currently drink any alcohol.  She was born in [place] and then moved in and states.  While in [place] she made strong habits.  She notes that she does not have any remarkable family history.  Full 10 point ROS is listed below.  MEDICAL HISTORY:  Past Medical History:  Diagnosis Date   Anemia     SURGICAL HISTORY: Past Surgical History:  Procedure Laterality Date   CATARACT EXTRACTION, BILATERAL     SHOULDER SURGERY      SOCIAL HISTORY: Social History   Socioeconomic History   Marital status: Widowed    Spouse name: Not on file   Number of children: Not on file   Years of education: Not on file   Highest education level: Not on file  Occupational History   Not on file  Tobacco Use   Smoking status: Never   Smokeless tobacco: Never  Vaping Use   Vaping Use: Never used  Substance and Sexual Activity   Alcohol use: Never   Drug use: Never   Sexual activity: Not Currently  Other Topics Concern   Not on file  Social History Narrative   Not on file   Social Determinants of Health   Financial Resource Strain: Not on file  Food Insecurity: Not on file  Transportation Needs: Not on file  Physical Activity: Not on file  Stress: Not on file  Social Connections: Not on file  Intimate Partner Violence: Not on file  FAMILY HISTORY: No family history on file.  ALLERGIES:  is allergic to food.  MEDICATIONS:  Current Outpatient Medications  Medication Sig Dispense Refill   acetaminophen (TYLENOL) 500 MG tablet Take 500 mg by mouth every 6 (six) hours as needed for mild pain.     Polyethylene Glycol 400 (VISINE DRY EYE RELIEF OP) Place 1 drop into both eyes as needed (dry eyes).     triamcinolone (NASACORT) 55 MCG/ACT AERO nasal inhaler Place 1 spray into the nose 2 (two) times daily. 1 each 6   triamcinolone cream (KENALOG) 0.1 % Apply 1 application topically 2 (two) times daily. (Patient not taking: No sig reported) 45 g 0   No current  facility-administered medications for this visit.    REVIEW OF SYSTEMS:   Constitutional: ( - ) fevers, ( - )  chills , ( - ) night sweats Eyes: ( - ) blurriness of vision, ( - ) double vision, ( - ) watery eyes Ears, nose, mouth, throat, and face: ( - ) mucositis, ( - ) sore throat Respiratory: ( - ) cough, ( - ) dyspnea, ( - ) wheezes Cardiovascular: ( - ) palpitation, ( - ) chest discomfort, ( - ) lower extremity swelling Gastrointestinal:  ( - ) nausea, ( - ) heartburn, ( - ) change in bowel habits Skin: ( - ) abnormal skin rashes Lymphatics: ( - ) new lymphadenopathy, ( - ) easy bruising Neurological: ( - ) numbness, ( - ) tingling, ( - ) new weaknesses Behavioral/Psych: ( - ) mood change, ( - ) new changes  All other systems were reviewed with the patient and are negative.  PHYSICAL EXAMINATION: ECOG PERFORMANCE STATUS: 1 - Symptomatic but completely ambulatory  Vitals:   09/30/21 0929  BP: (!) 139/49  Pulse: 79  Resp: 17  Temp: 97.8 F (36.6 C)  SpO2: 100%   Filed Weights   09/30/21 0929  Weight: 82 lb (37.2 kg)    GENERAL: well appearing elderly Hispanic female in NAD  SKIN: skin color, texture, turgor are normal, no rashes or significant lesions EYES: conjunctiva are pink and non-injected, sclera clear LUNGS: clear to auscultation and percussion with normal breathing effort HEART: regular rate & rhythm and no murmurs and no lower extremity edema Musculoskeletal: no cyanosis of digits and no clubbing  PSYCH: alert & oriented x 3, fluent speech NEURO: no focal motor/sensory deficits  LABORATORY DATA:  I have reviewed the data as listed CBC Latest Ref Rng & Units 10/05/2021 10/01/2021 10/01/2021  WBC 4.0 - 10.5 K/uL - 9.5 9.5  Hemoglobin 12.0 - 15.0 g/dL 7.5(L) 8.3(L) 8.2(L)  Hematocrit 36.0 - 46.0 % - 26.7(L) 26.4(L)  Platelets 150 - 400 K/uL - 183 178    CMP Latest Ref Rng & Units 10/01/2021 09/30/2021 09/24/2021  Glucose 70 - 99 mg/dL 130(H) 94 89  BUN 8 -  23 mg/dL 40(H) 41(H) 31  Creatinine 0.44 - 1.00 mg/dL 1.44(H) 1.36(H) 1.42(H)  Sodium 135 - 145 mmol/L 137 144 140  Potassium 3.5 - 5.1 mmol/L 4.0 4.3 4.9  Chloride 98 - 111 mmol/L 108 114(H) 105  CO2 22 - 32 mmol/L 21(L) 22 19(L)  Calcium 8.9 - 10.3 mg/dL 9.1 9.3 8.7  Total Protein 6.5 - 8.1 g/dL 6.6 7.1 6.9  Total Bilirubin 0.3 - 1.2 mg/dL 0.9 1.0 1.2  Alkaline Phos 38 - 126 U/L 101 89 120  AST 15 - 41 U/L 39 31 28  ALT 0 - 44 U/L 27 21 15  RADIOGRAPHIC STUDIES: CT Cervical Spine Wo Contrast  Result Date: 10/01/2021 CLINICAL DATA:  Syncope with fall.  Difficulty getting up. EXAM: CT CERVICAL SPINE WITHOUT CONTRAST TECHNIQUE: Multidetector CT imaging of the cervical spine was performed without intravenous contrast. Multiplanar CT image reconstructions were also generated. COMPARISON:  None. FINDINGS: Alignment: Normal. Skull base and vertebrae: No acute fracture. No primary bone lesion or focal pathologic process. Soft tissues and spinal canal: No prevertebral fluid or swelling. No visible canal hematoma. Disc levels: Mild disc space narrowing and endplate spurring noted at C3-4, C4-5, C5-6 and C6-7. Upper chest: Negative. Other: None. IMPRESSION: 1. No evidence for cervical spine fracture. 2. Cervical degenerative disc disease. Electronically Signed   By: Kerby Moors M.D.   On: 10/01/2021 12:16   MR BRAIN WO CONTRAST  Result Date: 10/01/2021 CLINICAL DATA:  Stroke, follow up EXAM: MRI HEAD WITHOUT CONTRAST TECHNIQUE: Multiplanar, multiecho pulse sequences of the brain and surrounding structures were obtained without intravenous contrast. COMPARISON:  None. FINDINGS: Brain: There is no acute infarction or intracranial hemorrhage. There is no intracranial mass, mass effect, or edema. There is no hydrocephalus or extra-axial fluid collection. Prominence of the ventricles and sulci reflects parenchymal volume loss. Patchy T2 hyperintensity in the supratentorial and pontine white matter is  nonspecific but probably reflects mild to moderate chronic microvascular ischemic changes. Punctate focus of susceptibility in the right occipital lobe is compatible with chronic microhemorrhage or mineralization. Vascular: Major vessel flow voids at the skull base are preserved. Skull and upper cervical spine: Decreased T1 marrow signal. Sinuses/Orbits: Paranasal sinuses are aerated. Orbits are unremarkable. Other: Sella is partially empty.  Mastoid air cells are clear. IMPRESSION: No evidence of recent infarction, hemorrhage, or mass. Mild to moderate chronic microvascular ischemic changes. Decreased T1 marrow signal may reflect hematopoietic marrow or other marrow infiltration. Electronically Signed   By: Macy Mis M.D.   On: 10/01/2021 10:34    ASSESSMENT & PLAN Julia Kerr 87 y.o. female with medical history significant for CKD stage IIIb who presents for evaluation of anemia.  After review of the labs, review of the records, and discussion with the patient the patients findings are most consistent with anemia secondary to CKD.  The patient's anemia is normocytic and she has known kidney disease.  She was previously receiving erythropoietin shots but stopped receiving them back in April 2022.  At this time we recommend she reestablish with nephrology and restart shots.  If there are additional hematological issues or worsening symptoms would be happy to see her back for further evaluation.  In order to be sure we are not missing any other possible causes of her anemia we will order a full nutritional work-up as well as a monoclonal gammopathy work-up.  #Anemia 2/2 to CKD -- Recommended the patient reconnect with her nephrologist and begin the erythropoietin shots. --We will order additional lab work to assure no other etiologies of her anemia.  Iron panel was previously unremarkable.  We will order full nutritional panel as well as monoclonal myopathy work-up --Do not recommend bone marrow  biopsy at this time.  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 --No need for routine follow-up in our clinic unless therapy new or worsening symptoms.  Orders Placed This Encounter  Procedures   CBC with Differential ([place] Only)    Standing Status:   Future    Number of Occurrences:   1    Standing Expiration Date:   09/30/2022  CMP ([place] only)    Standing Status:   Future    Number of Occurrences:   1    Standing Expiration Date:   09/30/2022   Lactate dehydrogenase (LDH)    Standing Status:   Future    Number of Occurrences:   1    Standing Expiration Date:   09/30/2022   Vitamin B12    Standing Status:   Future    Number of Occurrences:   1    Standing Expiration Date:   09/30/2022   Folate, Serum    Standing Status:   Future    Number of Occurrences:   1    Standing Expiration Date:   09/30/2022   Erythropoietin    Standing Status:   Future    Number of Occurrences:   1    Standing Expiration Date:   09/30/2022   Multiple Myeloma Panel (SPEP&IFE w/QIG)    Standing Status:   Future    Number of Occurrences:   1    Standing Expiration Date:   09/30/2022   Sample to Blood Bank    Standing Status:   Future    Number of Occurrences:   1    Standing Expiration Date:   09/30/2022    All questions were answered. The patient knows to call the clinic with any problems, questions or concerns.  A total of more than 60 minutes were spent on this encounter with face-to-face time and non-face-to-face time, including preparing to see the patient, ordering tests and/or medications, counseling the patient and coordination of care as outlined above.   Ledell Peoples, MD Department of Hematology/Oncology [place] Phone: [phone] Pager: [phone] Email: [email or web address]  10/06/2021 1:50 PM

## 2021-10-01 ENCOUNTER — Emergency Department (HOSPITAL_COMMUNITY)
Admission: EM | Admit: 2021-10-01 | Discharge: 2021-10-01 | Disposition: A | Discharge status: Home / Self Care | Payer: Medicare Other | Attending: Emergency Medicine | Admitting: Emergency Medicine

## 2021-10-01 ENCOUNTER — Other Ambulatory Visit: Payer: Self-pay

## 2021-10-01 ENCOUNTER — Encounter (HOSPITAL_COMMUNITY): Payer: Self-pay | Admitting: Emergency Medicine

## 2021-10-01 ENCOUNTER — Emergency Department (HOSPITAL_COMMUNITY): Payer: Medicare Other

## 2021-10-01 DIAGNOSIS — R55 Syncope and collapse: Secondary | ICD-10-CM | POA: Insufficient documentation

## 2021-10-01 DIAGNOSIS — R42 Dizziness and giddiness: Secondary | ICD-10-CM | POA: Insufficient documentation

## 2021-10-01 DIAGNOSIS — I639 Cerebral infarction, unspecified: Secondary | ICD-10-CM | POA: Diagnosis not present

## 2021-10-01 DIAGNOSIS — N183 Chronic kidney disease, stage 3 unspecified: Secondary | ICD-10-CM | POA: Diagnosis not present

## 2021-10-01 DIAGNOSIS — D649 Anemia, unspecified: Secondary | ICD-10-CM | POA: Insufficient documentation

## 2021-10-01 DIAGNOSIS — M5030 Other cervical disc degeneration, unspecified cervical region: Secondary | ICD-10-CM | POA: Diagnosis not present

## 2021-10-01 LAB — URINALYSIS, ROUTINE W REFLEX MICROSCOPIC
Bacteria, UA: NONE SEEN
Bilirubin Urine: NEGATIVE
Glucose, UA: NEGATIVE mg/dL
Ketones, ur: NEGATIVE mg/dL
Leukocytes,Ua: NEGATIVE
Nitrite: NEGATIVE
Protein, ur: 30 mg/dL — AB
Specific Gravity, Urine: 1.013 (ref 1.005–1.030)
pH: 5 (ref 5.0–8.0)

## 2021-10-01 LAB — CBC
HCT: 26.4 % — ABNORMAL LOW (ref 36.0–46.0)
Hemoglobin: 8.2 g/dL — ABNORMAL LOW (ref 12.0–15.0)
MCH: 30.8 pg (ref 26.0–34.0)
MCHC: 31.1 g/dL (ref 30.0–36.0)
MCV: 99.2 fL (ref 80.0–100.0)
Platelets: 178 10*3/uL (ref 150–400)
RBC: 2.66 MIL/uL — ABNORMAL LOW (ref 3.87–5.11)
RDW: 15.8 % — ABNORMAL HIGH (ref 11.5–15.5)
WBC: 9.5 10*3/uL (ref 4.0–10.5)
nRBC: 0 % (ref 0.0–0.2)

## 2021-10-01 LAB — BASIC METABOLIC PANEL
Anion gap: 8 (ref 5–15)
BUN: 40 mg/dL — ABNORMAL HIGH (ref 8–23)
CO2: 21 mmol/L — ABNORMAL LOW (ref 22–32)
Calcium: 9.1 mg/dL (ref 8.9–10.3)
Chloride: 108 mmol/L (ref 98–111)
Creatinine, Ser: 1.44 mg/dL — ABNORMAL HIGH (ref 0.44–1.00)
GFR, Estimated: 33 mL/min — ABNORMAL LOW (ref >60)
Glucose, Bld: 130 mg/dL — ABNORMAL HIGH (ref 70–99)
Potassium: 4 mmol/L (ref 3.5–5.1)
Sodium: 137 mmol/L (ref 135–145)

## 2021-10-01 LAB — CBC WITH DIFFERENTIAL/PLATELET
Abs Immature Granulocytes: 0 10*3/uL (ref 0.00–0.07)
Basophils Absolute: 0 10*3/uL (ref 0.0–0.1)
Basophils Relative: 0 %
Eosinophils Absolute: 0 10*3/uL (ref 0.0–0.5)
Eosinophils Relative: 0 %
HCT: 26.7 % — ABNORMAL LOW (ref 36.0–46.0)
Hemoglobin: 8.3 g/dL — ABNORMAL LOW (ref 12.0–15.0)
Lymphocytes Relative: 14 %
Lymphs Abs: 1.3 10*3/uL (ref 0.7–4.0)
MCH: 30.9 pg (ref 26.0–34.0)
MCHC: 31.1 g/dL (ref 30.0–36.0)
MCV: 99.3 fL (ref 80.0–100.0)
Monocytes Absolute: 0.6 10*3/uL (ref 0.1–1.0)
Monocytes Relative: 6 %
Neutro Abs: 7.6 10*3/uL (ref 1.7–7.7)
Neutrophils Relative %: 80 %
Platelets: 183 10*3/uL (ref 150–400)
RBC: 2.69 MIL/uL — ABNORMAL LOW (ref 3.87–5.11)
RDW: 15.8 % — ABNORMAL HIGH (ref 11.5–15.5)
WBC: 9.5 10*3/uL (ref 4.0–10.5)
nRBC: 0 % (ref 0.0–0.2)
nRBC: 0 /100 WBC

## 2021-10-01 LAB — HEPATIC FUNCTION PANEL
ALT: 27 U/L (ref 0–44)
AST: 39 U/L (ref 15–41)
Albumin: 3.9 g/dL (ref 3.5–5.0)
Alkaline Phosphatase: 101 U/L (ref 38–126)
Bilirubin, Direct: 0.2 mg/dL (ref 0.0–0.2)
Indirect Bilirubin: 0.7 mg/dL (ref 0.3–0.9)
Total Bilirubin: 0.9 mg/dL (ref 0.3–1.2)
Total Protein: 6.6 g/dL (ref 6.5–8.1)

## 2021-10-01 LAB — TYPE AND SCREEN
ABO/RH(D): A POS
Antibody Screen: NEGATIVE

## 2021-10-01 LAB — PROTIME-INR
INR: 1.3 — ABNORMAL HIGH (ref 0.8–1.2)
Prothrombin Time: 16.5 seconds — ABNORMAL HIGH (ref 11.4–15.2)

## 2021-10-01 LAB — CK: Total CK: 105 U/L (ref 38–234)

## 2021-10-01 MED ORDER — LACTATED RINGERS IV BOLUS
1000.0000 mL | Freq: Once | INTRAVENOUS | Status: AC
  Administered 2021-10-01: 1000 mL via INTRAVENOUS

## 2021-10-01 NOTE — ED Notes (Signed)
Put patient on the bedpan patient is now off going to CT

## 2021-10-01 NOTE — ED Notes (Signed)
Pt ambulated around room with assistance. Pt denies dizziness but does say she is weak.

## 2021-10-01 NOTE — ED Provider Notes (Signed)
[facility] EMERGENCY DEPARTMENT Provider Note   CSN: [id number] Arrival date & time: 10/01/21  4854     History Chief Complaint  Patient presents with   Loss of Consciousness    Rosmarie Esquibel is a 87 y.o. female.  HPI     Felt dizzy, fell down, and couldn't get up Started to feel dizzy around 7 or 8 last night, was sitting and stood up and felt dizzy, couldn't stand up or get up  Has been on the floor all night, wasn't able to get up Son was sleeping, when morning came was able to get to a phone to call him over. Lives alone in a little house in front of his.  Feels a little better now, not feeling dizzy Had syncope last night, no history of syncope Dizziness, legs giving out Denies numbness, weakness, difficulty talking or walking, visual changes or facial droop No chest pain, no dyspnea, no cough/fever/urinary symptoms/black or bloody stool/diarrhea Does have hx of gb problems and pain with eating, and avoids certain foods Is being seen for anemia  Past Medical History:  Diagnosis Date   Anemia     Patient Active Problem List   Diagnosis Date Noted   Seasonal allergic rhinitis 04/12/2021   Left sided sciatica 04/12/2021   Chronic rhinitis 02/06/2020   Adverse food reaction 02/06/2020   Vaccine counseling 02/06/2020   Cellulitis of lower extremity 08/18/2019   Insect bite 08/01/2019   Low serum thyroid stimulating hormone (TSH) 08/17/2018   Normocytic normochromic anemia 08/17/2018   CKD (chronic kidney disease), stage III ([place]) 08/17/2018   Dehydration, moderate 08/17/2018   Low HDL (under 40) 08/17/2018   Vitamin D deficiency 08/04/2018   B12 deficiency-requiring injections in past 08/04/2018   Bruit of right carotid artery 08/04/2018   Choledocholithiasis with chronic cholecystitis- not amenable to surgery due to age 62/70/3500   Biliary colic symptom- intolerant to dairy and fats due to gallstone presence 08/04/2018   Environmental and  seasonal allergies 08/04/2018   Low weight for height- bmi 17.5 08/04/2018   Postmenopausal bone loss 08/04/2018   Chronic fatigue- with B12 def 08/04/2018    Past Surgical History:  Procedure Laterality Date   CATARACT EXTRACTION, BILATERAL     SHOULDER SURGERY       OB History   No obstetric history on file.     No family history on file.  Social History   Tobacco Use   Smoking status: Never   Smokeless tobacco: Never  Vaping Use   Vaping Use: Never used  Substance Use Topics   Alcohol use: Never   Drug use: Never    Home Medications Prior to Admission medications   Medication Sig Start Date End Date Taking? Authorizing Provider  acetaminophen (TYLENOL) 500 MG tablet Take 500 mg by mouth every 6 (six) hours as needed for mild pain.   Yes [provider]  Polyethylene Glycol 400 (VISINE DRY EYE RELIEF OP) Place 1 drop into both eyes as needed (dry eyes).   Yes [provider]  triamcinolone (NASACORT) 55 MCG/ACT AERO nasal inhaler Place 1 spray into the nose 2 (two) times daily. 09/24/21  Yes Abonza, Maritza, PA-C  triamcinolone cream (KENALOG) 0.1 % Apply 1 application topically 2 (two) times daily. Patient not taking: No sig reported 05/15/21   Wieters, Elesa Hacker, PA-C    Allergies    Food  Review of Systems   Review of Systems  Constitutional:  Negative for fever.  Respiratory:  Negative for cough and shortness of breath.   Cardiovascular:  Negative for chest pain.  Gastrointestinal:  Negative for abdominal pain (has pain of gallbladder with eating fatty foods in particular, but no pain today because has not had food), nausea and vomiting.  Genitourinary:  Negative for dysuria.  Skin:  Negative for rash.  Neurological:  Positive for dizziness and syncope. Negative for facial asymmetry, speech difficulty, weakness, numbness and headaches.   Physical Exam Updated Vital Signs BP (!) 146/60   Pulse 69   Temp 98.4 F (36.9 C) (Oral)   Resp 16    Ht 4\' 8"  (1.422 m)   Wt 38.1 kg   SpO2 100%   BMI 18.83 kg/m   Physical Exam Vitals and nursing note reviewed.  Constitutional:      General: She is not in acute distress.    Appearance: She is well-developed. She is not diaphoretic.  HENT:     Head: Normocephalic and atraumatic.  Eyes:     Conjunctiva/sclera: Conjunctivae normal.  Cardiovascular:     Rate and Rhythm: Normal rate and regular rhythm.     Heart sounds: Normal heart sounds. No murmur heard.   No friction rub. No gallop.  Pulmonary:     Effort: Pulmonary effort is normal. No respiratory distress.     Breath sounds: Normal breath sounds. No wheezing or rales.  Abdominal:     General: There is no distension.     Palpations: Abdomen is soft.     Tenderness: There is no abdominal tenderness. There is no guarding.  Musculoskeletal:        General: No tenderness.     Cervical back: Normal range of motion.  Skin:    General: Skin is warm and dry.     Findings: No erythema or rash.  Neurological:     Mental Status: She is alert and oriented to person, place, and time.    ED Results / Procedures / Treatments   Labs (all labs ordered are listed, but only abnormal results are displayed) Labs Reviewed  BASIC METABOLIC PANEL - Abnormal; Notable for the following components:      Result Value   CO2 21 (*)    Glucose, Bld 130 (*)    BUN 40 (*)    Creatinine, Ser 1.44 (*)    GFR, Estimated 33 (*)    All other components within normal limits  CBC - Abnormal; Notable for the following components:   RBC 2.66 (*)    Hemoglobin 8.2 (*)    HCT 26.4 (*)    RDW 15.8 (*)    All other components within normal limits  URINALYSIS, ROUTINE W REFLEX MICROSCOPIC - Abnormal; Notable for the following components:   Hgb urine dipstick MODERATE (*)    Protein, ur 30 (*)    All other components within normal limits  PROTIME-INR - Abnormal; Notable for the following components:   Prothrombin Time 16.5 (*)    INR 1.3 (*)    All  other components within normal limits  CBC WITH DIFFERENTIAL/PLATELET - Abnormal; Notable for the following components:   RBC 2.69 (*)    Hemoglobin 8.3 (*)    HCT 26.7 (*)    RDW 15.8 (*)    All other components within normal limits  HEPATIC FUNCTION PANEL  CK  CBG MONITORING, ED  TYPE AND SCREEN    EKG EKG Interpretation  Date/Time:  Thursday October 01 2021 07:05:06 EDT Ventricular Rate:  75 PR Interval:  138  QRS Duration: 96 QT Interval:  376 QTC Calculation: 419 R Axis:   -74 Text Interpretation: Normal sinus rhythm Left axis deviation Incomplete right bundle branch block Abnormal ECG No previous ECGs available Confirmed by Gareth Morgan [id number] on 10/01/2021 11:03:12 AM  Radiology CT Cervical Spine Wo Contrast  Result Date: 10/01/2021 CLINICAL DATA:  Syncope with fall.  Difficulty getting up. EXAM: CT CERVICAL SPINE WITHOUT CONTRAST TECHNIQUE: Multidetector CT imaging of the cervical spine was performed without intravenous contrast. Multiplanar CT image reconstructions were also generated. COMPARISON:  None. FINDINGS: Alignment: Normal. Skull base and vertebrae: No acute fracture. No primary bone lesion or focal pathologic process. Soft tissues and spinal canal: No prevertebral fluid or swelling. No visible canal hematoma. Disc levels: Mild disc space narrowing and endplate spurring noted at C3-4, C4-5, C5-6 and C6-7. Upper chest: Negative. Other: None. IMPRESSION: 1. No evidence for cervical spine fracture. 2. Cervical degenerative disc disease. Electronically Signed   By: Kerby Moors M.D.   On: 10/01/2021 12:16   MR BRAIN WO CONTRAST  Result Date: 10/01/2021 CLINICAL DATA:  Stroke, follow up EXAM: MRI HEAD WITHOUT CONTRAST TECHNIQUE: Multiplanar, multiecho pulse sequences of the brain and surrounding structures were obtained without intravenous contrast. COMPARISON:  None. FINDINGS: Brain: There is no acute infarction or intracranial hemorrhage. There is no  intracranial mass, mass effect, or edema. There is no hydrocephalus or extra-axial fluid collection. Prominence of the ventricles and sulci reflects parenchymal volume loss. Patchy T2 hyperintensity in the supratentorial and pontine white matter is nonspecific but probably reflects mild to moderate chronic microvascular ischemic changes. Punctate focus of susceptibility in the right occipital lobe is compatible with chronic microhemorrhage or mineralization. Vascular: Major vessel flow voids at the skull base are preserved. Skull and upper cervical spine: Decreased T1 marrow signal. Sinuses/Orbits: Paranasal sinuses are aerated. Orbits are unremarkable. Other: Sella is partially empty.  Mastoid air cells are clear. IMPRESSION: No evidence of recent infarction, hemorrhage, or mass. Mild to moderate chronic microvascular ischemic changes. Decreased T1 marrow signal may reflect hematopoietic marrow or other marrow infiltration. Electronically Signed   By: Macy Mis M.D.   On: 10/01/2021 10:34    Procedures Procedures   Medications Ordered in ED Medications  lactated ringers bolus 1,000 mL (0 mLs Intravenous Stopped 10/01/21 1122)    ED Course  I have reviewed the triage vital signs and the nursing notes.  Pertinent labs & imaging results that were available during my care of the patient were reviewed by me and considered in my medical decision making (see chart for details).    MDM Rules/Calculators/A&P                           87 year old female with history of CKD, symptomatic cholelithiasis, anemia (recent transfusion 10/5) presents with concern for dizziness and syncope last night with inability to get up last night.   EKG evaluated by me and shows sinus rhythm with no sign of prolonged QTc, no brugada, no sign of HOCM, no acute ST abnormalities.  Renal function at baseline, no significant electrolyte anbormlaities. Hgb today 8.2, has had similar values and this is being evaluated as an  outpatient.  Given above 8 without significant acute change do not suspect this was etiology of syncopal episode.  No sign of sepsis or infection.  CK normal after being on floor last night.  CT CSpine without fx from fall.  Given dizziness preceding fall, MRI brain completed to  evaluate for central etiology of dizziness and shows no acute abnormalities.  Eats less due to cholelithiasis, suspect episode may have been related to dehydration. Given IV fluids, able to walk in ED.  Discussed that other cardiac arrhythmia on the differential however given preceding lightheadedness without other chest pain/palpitations, other potential explanation, feel this can be evaluated further as an outpatient. Recommend PCP and hematology follow up. Patient discharged in stable condition with understanding of reasons to return.     Final Clinical Impression(s) / ED Diagnoses Final diagnoses:  Syncope, unspecified syncope type  Dizziness  Anemia, unspecified type    Rx / DC Orders ED Discharge Orders     None        Gareth Morgan, MD 10/02/21 [phone]

## 2021-10-01 NOTE — ED Triage Notes (Signed)
Pt from home, she had another syncopel episode when she was trying to get up.  She remembers being dizzy and then trying to get up off the floor but could not b/c her legs were too week.  She has blood taken yesterday at the "blood specialist"

## 2021-10-02 LAB — ERYTHROPOIETIN: Erythropoietin: 8.3 m[IU]/mL (ref 2.6–18.5)

## 2021-10-05 ENCOUNTER — Other Ambulatory Visit: Payer: Self-pay

## 2021-10-05 ENCOUNTER — Ambulatory Visit (HOSPITAL_COMMUNITY)
Admission: RE | Admit: 2021-10-05 | Discharge: 2021-10-05 | Disposition: A | Discharge status: Home / Self Care | Payer: Medicare Other | Source: Ambulatory Visit | Attending: Nephrology | Admitting: Nephrology

## 2021-10-05 VITALS — BP 112/37 | HR 64 | Temp 97.8°F | Resp 18

## 2021-10-05 DIAGNOSIS — D631 Anemia in chronic kidney disease: Secondary | ICD-10-CM | POA: Insufficient documentation

## 2021-10-05 DIAGNOSIS — N183 Chronic kidney disease, stage 3 unspecified: Secondary | ICD-10-CM | POA: Insufficient documentation

## 2021-10-05 LAB — IRON AND TIBC
Iron: 80 ug/dL (ref 28–170)
Saturation Ratios: 34 % — ABNORMAL HIGH (ref 10.4–31.8)
TIBC: 234 ug/dL — ABNORMAL LOW (ref 250–450)
UIBC: 154 ug/dL

## 2021-10-05 LAB — MULTIPLE MYELOMA PANEL, SERUM
Albumin SerPl Elph-Mcnc: 3.9 g/dL (ref 2.9–4.4)
Albumin/Glob SerPl: 1.6 (ref 0.7–1.7)
Alpha 1: 0.3 g/dL (ref 0.0–0.4)
Alpha2 Glob SerPl Elph-Mcnc: 0.7 g/dL (ref 0.4–1.0)
B-Globulin SerPl Elph-Mcnc: 0.8 g/dL (ref 0.7–1.3)
Gamma Glob SerPl Elph-Mcnc: 0.8 g/dL (ref 0.4–1.8)
Globulin, Total: 2.6 g/dL (ref 2.2–3.9)
IgA: 272 mg/dL (ref 64–422)
IgG (Immunoglobin G), Serum: 775 mg/dL (ref 586–1602)
IgM (Immunoglobulin M), Srm: 72 mg/dL (ref 26–217)
Total Protein ELP: 6.5 g/dL (ref 6.0–8.5)

## 2021-10-05 LAB — FERRITIN: Ferritin: 1138 ng/mL — ABNORMAL HIGH (ref 11–307)

## 2021-10-05 LAB — POCT HEMOGLOBIN-HEMACUE: Hemoglobin: 7.5 g/dL — ABNORMAL LOW (ref 12.0–15.0)

## 2021-10-05 MED ORDER — EPOETIN ALFA-EPBX 40000 UNIT/ML IJ SOLN: 30000.0000 [IU] | INTRAMUSCULAR | Status: DC

## 2021-10-05 MED ORDER — EPOETIN ALFA-EPBX 40000 UNIT/ML IJ SOLN
INTRAMUSCULAR | Status: AC
  Administered 2021-10-05: 30000 [IU] via SUBCUTANEOUS
  Filled 2021-10-05: qty 1

## 2021-10-06 ENCOUNTER — Encounter (HOSPITAL_COMMUNITY): Payer: Self-pay

## 2021-11-02 ENCOUNTER — Encounter (HOSPITAL_COMMUNITY): Payer: Medicare Other

## 2021-11-02 ENCOUNTER — Other Ambulatory Visit: Payer: Self-pay

## 2021-11-02 ENCOUNTER — Ambulatory Visit (HOSPITAL_COMMUNITY)
Admission: RE | Admit: 2021-11-02 | Discharge: 2021-11-02 | Disposition: A | Discharge status: Home / Self Care | Payer: Medicare Other | Source: Ambulatory Visit | Attending: Nephrology | Admitting: Nephrology

## 2021-11-02 VITALS — BP 112/44 | HR 81 | Temp 97.2°F | Resp 20

## 2021-11-02 DIAGNOSIS — N183 Chronic kidney disease, stage 3 unspecified: Secondary | ICD-10-CM | POA: Insufficient documentation

## 2021-11-02 DIAGNOSIS — D631 Anemia in chronic kidney disease: Secondary | ICD-10-CM | POA: Insufficient documentation

## 2021-11-02 LAB — IRON AND TIBC
Iron: 68 ug/dL (ref 28–170)
Saturation Ratios: 29 % (ref 10.4–31.8)
TIBC: 234 ug/dL — ABNORMAL LOW (ref 250–450)
UIBC: 166 ug/dL

## 2021-11-02 LAB — FERRITIN: Ferritin: 1311 ng/mL — ABNORMAL HIGH (ref 11–307)

## 2021-11-02 LAB — POCT HEMOGLOBIN-HEMACUE: Hemoglobin: 6.8 g/dL — CL (ref 12.0–15.0)

## 2021-11-02 MED ORDER — EPOETIN ALFA-EPBX 40000 UNIT/ML IJ SOLN
INTRAMUSCULAR | Status: AC
  Filled 2021-11-02: qty 1

## 2021-11-02 MED ORDER — EPOETIN ALFA-EPBX 40000 UNIT/ML IJ SOLN: 30000.0000 [IU] | INTRAMUSCULAR | Status: DC

## 2021-11-02 MED ORDER — EPOETIN ALFA-EPBX 10000 UNIT/ML IJ SOLN: 20000.0000 [IU] | Freq: Once | INTRAMUSCULAR | Status: AC

## 2021-11-02 MED ORDER — EPOETIN ALFA-EPBX 10000 UNIT/ML IJ SOLN
INTRAMUSCULAR | Status: AC
  Administered 2021-11-02: 20000 [IU] via SUBCUTANEOUS
  Filled 2021-11-02: qty 2

## 2021-11-02 NOTE — Progress Notes (Signed)
Pt here for retacrit injection.  HGB via hemocue 6.5, repeated and was 6.8. Pt c/o no symptoms, no SOB, chest pain or active bleeding noted.   Amber at Dr Louie Boston office notified. New orders received

## 2021-11-03 ENCOUNTER — Other Ambulatory Visit (HOSPITAL_COMMUNITY): Payer: Self-pay | Admitting: *Deleted

## 2021-11-04 ENCOUNTER — Encounter (HOSPITAL_COMMUNITY)
Admission: RE | Admit: 2021-11-04 | Discharge: 2021-11-04 | Disposition: A | Discharge status: Home / Self Care | Payer: Medicare Other | Source: Ambulatory Visit | Attending: Nephrology | Admitting: Nephrology

## 2021-11-04 DIAGNOSIS — N183 Chronic kidney disease, stage 3 unspecified: Secondary | ICD-10-CM | POA: Insufficient documentation

## 2021-11-04 LAB — PREPARE RBC (CROSSMATCH)

## 2021-11-04 MED ORDER — SODIUM CHLORIDE 0.9% IV SOLUTION: Freq: Once | INTRAVENOUS | Status: DC

## 2021-11-05 LAB — TYPE AND SCREEN
ABO/RH(D): A POS
Antibody Screen: NEGATIVE
Unit division: 0

## 2021-11-05 LAB — BPAM RBC
Blood Product Expiration Date: 202212022359
ISSUE DATE / TIME: 202211160927
Unit Type and Rh: 6200

## 2021-11-16 ENCOUNTER — Encounter (HOSPITAL_COMMUNITY)
Admission: RE | Admit: 2021-11-16 | Discharge: 2021-11-16 | Disposition: A | Discharge status: Home / Self Care | Payer: Medicare Other | Source: Ambulatory Visit | Attending: Nephrology | Admitting: Nephrology

## 2021-11-16 VITALS — BP 100/58 | HR 76 | Temp 97.1°F | Resp 18

## 2021-11-16 DIAGNOSIS — N183 Chronic kidney disease, stage 3 unspecified: Secondary | ICD-10-CM | POA: Diagnosis not present

## 2021-11-16 LAB — POCT HEMOGLOBIN-HEMACUE: Hemoglobin: 8.6 g/dL — ABNORMAL LOW (ref 12.0–15.0)

## 2021-11-16 MED ORDER — EPOETIN ALFA-EPBX 10000 UNIT/ML IJ SOLN: 20000.0000 [IU] | INTRAMUSCULAR | Status: DC

## 2021-11-16 MED ORDER — EPOETIN ALFA-EPBX 10000 UNIT/ML IJ SOLN
INTRAMUSCULAR | Status: AC
  Administered 2021-11-16: 10:00:00 20000 [IU] via SUBCUTANEOUS
  Filled 2021-11-16: qty 2

## 2021-11-30 ENCOUNTER — Encounter (HOSPITAL_COMMUNITY)
Admission: RE | Admit: 2021-11-30 | Discharge: 2021-11-30 | Disposition: A | Discharge status: Home / Self Care | Payer: Medicare Other | Source: Ambulatory Visit | Attending: Nephrology | Admitting: Nephrology

## 2021-11-30 ENCOUNTER — Other Ambulatory Visit: Payer: Self-pay

## 2021-11-30 VITALS — BP 131/61 | HR 77 | Resp 19

## 2021-11-30 DIAGNOSIS — D631 Anemia in chronic kidney disease: Secondary | ICD-10-CM | POA: Diagnosis not present

## 2021-11-30 DIAGNOSIS — N183 Chronic kidney disease, stage 3 unspecified: Secondary | ICD-10-CM | POA: Diagnosis not present

## 2021-11-30 LAB — IRON AND TIBC
Iron: 115 ug/dL (ref 28–170)
Saturation Ratios: 47 % — ABNORMAL HIGH (ref 10.4–31.8)
TIBC: 242 ug/dL — ABNORMAL LOW (ref 250–450)
UIBC: 127 ug/dL

## 2021-11-30 LAB — FERRITIN: Ferritin: 919 ng/mL — ABNORMAL HIGH (ref 11–307)

## 2021-11-30 LAB — POCT HEMOGLOBIN-HEMACUE: Hemoglobin: 9 g/dL — ABNORMAL LOW (ref 12.0–15.0)

## 2021-11-30 MED ORDER — EPOETIN ALFA-EPBX 10000 UNIT/ML IJ SOLN
INTRAMUSCULAR | Status: AC
  Filled 2021-11-30: qty 2

## 2021-11-30 MED ORDER — EPOETIN ALFA-EPBX 10000 UNIT/ML IJ SOLN
20000.0000 [IU] | INTRAMUSCULAR | Status: DC
  Administered 2021-11-30: 20000 [IU] via SUBCUTANEOUS

## 2021-12-15 ENCOUNTER — Other Ambulatory Visit: Payer: Self-pay

## 2021-12-15 ENCOUNTER — Encounter (HOSPITAL_COMMUNITY)
Admission: RE | Admit: 2021-12-15 | Discharge: 2021-12-15 | Disposition: A | Discharge status: Home / Self Care | Payer: Medicare Other | Source: Ambulatory Visit | Attending: Nephrology | Admitting: Nephrology

## 2021-12-15 VITALS — BP 116/54 | HR 70 | Temp 97.8°F | Resp 18

## 2021-12-15 DIAGNOSIS — N183 Chronic kidney disease, stage 3 unspecified: Secondary | ICD-10-CM | POA: Diagnosis not present

## 2021-12-15 DIAGNOSIS — D631 Anemia in chronic kidney disease: Secondary | ICD-10-CM | POA: Diagnosis not present

## 2021-12-15 LAB — POCT HEMOGLOBIN-HEMACUE: Hemoglobin: 9.4 g/dL — ABNORMAL LOW (ref 12.0–15.0)

## 2021-12-15 MED ORDER — EPOETIN ALFA-EPBX 10000 UNIT/ML IJ SOLN
INTRAMUSCULAR | Status: AC
  Administered 2021-12-15: 10:00:00 20000 [IU] via SUBCUTANEOUS
  Filled 2021-12-15: qty 2

## 2021-12-15 MED ORDER — EPOETIN ALFA-EPBX 10000 UNIT/ML IJ SOLN: 20000.0000 [IU] | INTRAMUSCULAR | Status: DC

## 2021-12-29 ENCOUNTER — Other Ambulatory Visit: Payer: Self-pay

## 2021-12-29 ENCOUNTER — Encounter (HOSPITAL_COMMUNITY)
Admission: RE | Admit: 2021-12-29 | Discharge: 2021-12-29 | Disposition: A | Discharge status: Home / Self Care | Payer: Medicare Other | Source: Ambulatory Visit | Attending: Nephrology | Admitting: Nephrology

## 2021-12-29 VITALS — BP 130/49 | HR 76 | Temp 97.0°F | Resp 18

## 2021-12-29 DIAGNOSIS — D631 Anemia in chronic kidney disease: Secondary | ICD-10-CM | POA: Diagnosis not present

## 2021-12-29 DIAGNOSIS — N183 Chronic kidney disease, stage 3 unspecified: Secondary | ICD-10-CM | POA: Insufficient documentation

## 2021-12-29 LAB — FERRITIN: Ferritin: 987 ng/mL — ABNORMAL HIGH (ref 11–307)

## 2021-12-29 LAB — IRON AND TIBC
Iron: 175 ug/dL — ABNORMAL HIGH (ref 28–170)
Saturation Ratios: 71 % — ABNORMAL HIGH (ref 10.4–31.8)
TIBC: 246 ug/dL — ABNORMAL LOW (ref 250–450)
UIBC: 71 ug/dL

## 2021-12-29 LAB — POCT HEMOGLOBIN-HEMACUE: Hemoglobin: 9 g/dL — ABNORMAL LOW (ref 12.0–15.0)

## 2021-12-29 MED ORDER — EPOETIN ALFA-EPBX 10000 UNIT/ML IJ SOLN
20000.0000 [IU] | INTRAMUSCULAR | Status: DC
  Administered 2021-12-29: 20000 [IU] via SUBCUTANEOUS

## 2021-12-29 MED ORDER — EPOETIN ALFA-EPBX 10000 UNIT/ML IJ SOLN
INTRAMUSCULAR | Status: AC
  Filled 2021-12-29: qty 2

## 2022-01-12 ENCOUNTER — Encounter (HOSPITAL_COMMUNITY): Payer: Medicare Other

## 2022-01-13 ENCOUNTER — Encounter (HOSPITAL_COMMUNITY)
Admission: RE | Admit: 2022-01-13 | Discharge: 2022-01-13 | Disposition: A | Discharge status: Home / Self Care | Payer: Medicare Other | Source: Ambulatory Visit | Attending: Nephrology | Admitting: Nephrology

## 2022-01-13 VITALS — BP 135/61 | HR 77 | Temp 97.0°F | Resp 18

## 2022-01-13 DIAGNOSIS — N183 Chronic kidney disease, stage 3 unspecified: Secondary | ICD-10-CM | POA: Diagnosis not present

## 2022-01-13 DIAGNOSIS — D631 Anemia in chronic kidney disease: Secondary | ICD-10-CM | POA: Diagnosis not present

## 2022-01-13 LAB — POCT HEMOGLOBIN-HEMACUE: Hemoglobin: 8.6 g/dL — ABNORMAL LOW (ref 12.0–15.0)

## 2022-01-13 MED ORDER — EPOETIN ALFA-EPBX 10000 UNIT/ML IJ SOLN
INTRAMUSCULAR | Status: AC
  Filled 2022-01-13: qty 2

## 2022-01-13 MED ORDER — EPOETIN ALFA-EPBX 10000 UNIT/ML IJ SOLN
20000.0000 [IU] | INTRAMUSCULAR | Status: DC
  Administered 2022-01-13: 09:00:00 20000 [IU] via SUBCUTANEOUS

## 2022-01-17 ENCOUNTER — Other Ambulatory Visit: Payer: Self-pay

## 2022-01-17 ENCOUNTER — Emergency Department (HOSPITAL_COMMUNITY): Payer: Medicare Other

## 2022-01-17 ENCOUNTER — Inpatient Hospital Stay (HOSPITAL_COMMUNITY)
Admission: EM | Admit: 2022-01-17 | Discharge: 2022-01-19 | DRG: 536 | Disposition: A | Discharge status: Skilled Nursing Facility | Payer: Medicare Other | Attending: Internal Medicine | Admitting: Internal Medicine

## 2022-01-17 ENCOUNTER — Encounter (HOSPITAL_COMMUNITY): Payer: Self-pay

## 2022-01-17 DIAGNOSIS — S32592A Other specified fracture of left pubis, initial encounter for closed fracture: Secondary | ICD-10-CM

## 2022-01-17 DIAGNOSIS — M810 Age-related osteoporosis without current pathological fracture: Secondary | ICD-10-CM | POA: Diagnosis not present

## 2022-01-17 DIAGNOSIS — N1832 Chronic kidney disease, stage 3b: Secondary | ICD-10-CM | POA: Diagnosis present

## 2022-01-17 DIAGNOSIS — M6281 Muscle weakness (generalized): Secondary | ICD-10-CM | POA: Diagnosis not present

## 2022-01-17 DIAGNOSIS — Z20822 Contact with and (suspected) exposure to covid-19: Secondary | ICD-10-CM | POA: Diagnosis not present

## 2022-01-17 DIAGNOSIS — D72828 Other elevated white blood cell count: Secondary | ICD-10-CM | POA: Diagnosis present

## 2022-01-17 DIAGNOSIS — S32302A Unspecified fracture of left ilium, initial encounter for closed fracture: Secondary | ICD-10-CM | POA: Diagnosis not present

## 2022-01-17 DIAGNOSIS — D62 Acute posthemorrhagic anemia: Secondary | ICD-10-CM | POA: Diagnosis not present

## 2022-01-17 DIAGNOSIS — D631 Anemia in chronic kidney disease: Secondary | ICD-10-CM | POA: Diagnosis not present

## 2022-01-17 DIAGNOSIS — S329XXA Fracture of unspecified parts of lumbosacral spine and pelvis, initial encounter for closed fracture: Secondary | ICD-10-CM | POA: Diagnosis not present

## 2022-01-17 DIAGNOSIS — Z1159 Encounter for screening for other viral diseases: Secondary | ICD-10-CM | POA: Diagnosis not present

## 2022-01-17 DIAGNOSIS — R9431 Abnormal electrocardiogram [ECG] [EKG]: Secondary | ICD-10-CM | POA: Diagnosis not present

## 2022-01-17 DIAGNOSIS — S3282XA Multiple fractures of pelvis without disruption of pelvic ring, initial encounter for closed fracture: Principal | ICD-10-CM | POA: Diagnosis present

## 2022-01-17 DIAGNOSIS — S3289XA Fracture of other parts of pelvis, initial encounter for closed fracture: Secondary | ICD-10-CM | POA: Diagnosis not present

## 2022-01-17 DIAGNOSIS — M25552 Pain in left hip: Secondary | ICD-10-CM | POA: Diagnosis not present

## 2022-01-17 DIAGNOSIS — E43 Unspecified severe protein-calorie malnutrition: Secondary | ICD-10-CM | POA: Diagnosis not present

## 2022-01-17 DIAGNOSIS — Y92019 Unspecified place in single-family (private) house as the place of occurrence of the external cause: Secondary | ICD-10-CM

## 2022-01-17 DIAGNOSIS — N179 Acute kidney failure, unspecified: Secondary | ICD-10-CM | POA: Diagnosis present

## 2022-01-17 DIAGNOSIS — Y93D2 Activity, sewing: Secondary | ICD-10-CM | POA: Diagnosis not present

## 2022-01-17 DIAGNOSIS — N189 Chronic kidney disease, unspecified: Secondary | ICD-10-CM

## 2022-01-17 DIAGNOSIS — J3089 Other allergic rhinitis: Secondary | ICD-10-CM | POA: Diagnosis not present

## 2022-01-17 DIAGNOSIS — R6889 Other general symptoms and signs: Secondary | ICD-10-CM | POA: Diagnosis not present

## 2022-01-17 DIAGNOSIS — M5432 Sciatica, left side: Secondary | ICD-10-CM | POA: Diagnosis not present

## 2022-01-17 DIAGNOSIS — Z681 Body mass index (BMI) 19 or less, adult: Secondary | ICD-10-CM | POA: Diagnosis not present

## 2022-01-17 DIAGNOSIS — R509 Fever, unspecified: Secondary | ICD-10-CM | POA: Diagnosis not present

## 2022-01-17 DIAGNOSIS — W07XXXA Fall from chair, initial encounter: Secondary | ICD-10-CM | POA: Diagnosis present

## 2022-01-17 DIAGNOSIS — K8044 Calculus of bile duct with chronic cholecystitis without obstruction: Secondary | ICD-10-CM | POA: Diagnosis not present

## 2022-01-17 DIAGNOSIS — E538 Deficiency of other specified B group vitamins: Secondary | ICD-10-CM | POA: Diagnosis not present

## 2022-01-17 DIAGNOSIS — S300XXS Contusion of lower back and pelvis, sequela: Secondary | ICD-10-CM | POA: Diagnosis not present

## 2022-01-17 DIAGNOSIS — S32512A Fracture of superior rim of left pubis, initial encounter for closed fracture: Secondary | ICD-10-CM | POA: Diagnosis not present

## 2022-01-17 DIAGNOSIS — E559 Vitamin D deficiency, unspecified: Secondary | ICD-10-CM | POA: Diagnosis not present

## 2022-01-17 DIAGNOSIS — M25562 Pain in left knee: Secondary | ICD-10-CM | POA: Diagnosis not present

## 2022-01-17 DIAGNOSIS — J310 Chronic rhinitis: Secondary | ICD-10-CM | POA: Diagnosis not present

## 2022-01-17 DIAGNOSIS — Z9181 History of falling: Secondary | ICD-10-CM | POA: Diagnosis not present

## 2022-01-17 DIAGNOSIS — E860 Dehydration: Secondary | ICD-10-CM | POA: Diagnosis present

## 2022-01-17 DIAGNOSIS — D649 Anemia, unspecified: Secondary | ICD-10-CM | POA: Diagnosis not present

## 2022-01-17 DIAGNOSIS — J302 Other seasonal allergic rhinitis: Secondary | ICD-10-CM | POA: Diagnosis not present

## 2022-01-17 DIAGNOSIS — Z743 Need for continuous supervision: Secondary | ICD-10-CM | POA: Diagnosis not present

## 2022-01-17 DIAGNOSIS — W19XXXA Unspecified fall, initial encounter: Secondary | ICD-10-CM | POA: Diagnosis not present

## 2022-01-17 DIAGNOSIS — N183 Chronic kidney disease, stage 3 unspecified: Secondary | ICD-10-CM | POA: Diagnosis present

## 2022-01-17 DIAGNOSIS — S3282XD Multiple fractures of pelvis without disruption of pelvic ring, subsequent encounter for fracture with routine healing: Secondary | ICD-10-CM | POA: Diagnosis not present

## 2022-01-17 DIAGNOSIS — S32402A Unspecified fracture of left acetabulum, initial encounter for closed fracture: Secondary | ICD-10-CM

## 2022-01-17 LAB — BASIC METABOLIC PANEL
Anion gap: 11 (ref 5–15)
BUN: 42 mg/dL — ABNORMAL HIGH (ref 8–23)
CO2: 18 mmol/L — ABNORMAL LOW (ref 22–32)
Calcium: 8.5 mg/dL — ABNORMAL LOW (ref 8.9–10.3)
Chloride: 109 mmol/L (ref 98–111)
Creatinine, Ser: 1.61 mg/dL — ABNORMAL HIGH (ref 0.44–1.00)
GFR, Estimated: 29 mL/min — ABNORMAL LOW (ref >60)
Glucose, Bld: 142 mg/dL — ABNORMAL HIGH (ref 70–99)
Potassium: 4.3 mmol/L (ref 3.5–5.1)
Sodium: 138 mmol/L (ref 135–145)

## 2022-01-17 LAB — CBC WITH DIFFERENTIAL/PLATELET
Abs Immature Granulocytes: 0.56 10*3/uL — ABNORMAL HIGH (ref 0.00–0.07)
Basophils Absolute: 0 10*3/uL (ref 0.0–0.1)
Basophils Relative: 0 %
Eosinophils Absolute: 0 10*3/uL (ref 0.0–0.5)
Eosinophils Relative: 0 %
HCT: 28.5 % — ABNORMAL LOW (ref 36.0–46.0)
Hemoglobin: 8.5 g/dL — ABNORMAL LOW (ref 12.0–15.0)
Immature Granulocytes: 4 %
Lymphocytes Relative: 8 %
Lymphs Abs: 1.1 10*3/uL (ref 0.7–4.0)
MCH: 28.7 pg (ref 26.0–34.0)
MCHC: 29.8 g/dL — ABNORMAL LOW (ref 30.0–36.0)
MCV: 96.3 fL (ref 80.0–100.0)
Monocytes Absolute: 2.8 10*3/uL — ABNORMAL HIGH (ref 0.1–1.0)
Monocytes Relative: 21 %
Neutro Abs: 8.9 10*3/uL — ABNORMAL HIGH (ref 1.7–7.7)
Neutrophils Relative %: 67 %
Platelets: 178 10*3/uL (ref 150–400)
RBC: 2.96 MIL/uL — ABNORMAL LOW (ref 3.87–5.11)
RDW: 18.7 % — ABNORMAL HIGH (ref 11.5–15.5)
WBC: 13.3 10*3/uL — ABNORMAL HIGH (ref 4.0–10.5)
nRBC: 0 % (ref 0.0–0.2)

## 2022-01-17 LAB — PROTIME-INR
INR: 1.2 (ref 0.8–1.2)
Prothrombin Time: 15.6 seconds — ABNORMAL HIGH (ref 11.4–15.2)

## 2022-01-17 LAB — RESP PANEL BY RT-PCR (FLU A&B, COVID) ARPGX2
Influenza A by PCR: NEGATIVE
Influenza B by PCR: NEGATIVE
SARS Coronavirus 2 by RT PCR: NEGATIVE

## 2022-01-17 LAB — TYPE AND SCREEN
ABO/RH(D): A POS
Antibody Screen: NEGATIVE

## 2022-01-17 MED ORDER — METHOCARBAMOL 500 MG PO TABS
500.0000 mg | ORAL_TABLET | Freq: Four times a day (QID) | ORAL | Status: DC | PRN
Start: 2022-01-17 — End: 2022-01-19
  Administered 2022-01-17: 500 mg via ORAL
  Filled 2022-01-17 (×2): qty 1

## 2022-01-17 MED ORDER — MORPHINE SULFATE (PF) 2 MG/ML IV SOLN
2.0000 mg | Freq: Once | INTRAVENOUS | Status: AC
  Administered 2022-01-17: 2 mg via INTRAVENOUS
  Filled 2022-01-17: qty 1

## 2022-01-17 MED ORDER — DOCUSATE SODIUM 100 MG PO CAPS
100.0000 mg | ORAL_CAPSULE | Freq: Two times a day (BID) | ORAL | Status: DC
  Administered 2022-01-17 – 2022-01-18 (×2): 100 mg via ORAL
  Filled 2022-01-17 (×2): qty 1

## 2022-01-17 MED ORDER — POLYETHYLENE GLYCOL 3350 17 G PO PACK: 17.0000 g | PACK | Freq: Every day | ORAL | Status: DC | PRN

## 2022-01-17 MED ORDER — METHOCARBAMOL 1000 MG/10ML IJ SOLN
500.0000 mg | Freq: Four times a day (QID) | INTRAVENOUS | Status: DC | PRN
  Filled 2022-01-17: qty 5

## 2022-01-17 MED ORDER — POLYETHYLENE GLYCOL 400 1 % OP SOLN: OPHTHALMIC | Status: DC | PRN

## 2022-01-17 MED ORDER — BISACODYL 5 MG PO TBEC: 5.0000 mg | DELAYED_RELEASE_TABLET | Freq: Every day | ORAL | Status: DC | PRN

## 2022-01-17 MED ORDER — ENOXAPARIN SODIUM 30 MG/0.3ML IJ SOSY
30.0000 mg | PREFILLED_SYRINGE | INTRAMUSCULAR | Status: DC
  Administered 2022-01-17: 30 mg via SUBCUTANEOUS
  Filled 2022-01-17: qty 0.3

## 2022-01-17 MED ORDER — ONDANSETRON HCL 4 MG/2ML IJ SOLN: 4.0000 mg | Freq: Four times a day (QID) | INTRAMUSCULAR | Status: DC | PRN

## 2022-01-17 MED ORDER — OXYCODONE HCL 5 MG PO TABS
5.0000 mg | ORAL_TABLET | ORAL | Status: DC | PRN
  Administered 2022-01-17: 10 mg via ORAL
  Administered 2022-01-18: 5 mg via ORAL
  Filled 2022-01-17: qty 1
  Filled 2022-01-17 (×3): qty 2

## 2022-01-17 MED ORDER — POLYVINYL ALCOHOL 1.4 % OP SOLN: 1.0000 [drp] | OPHTHALMIC | Status: DC | PRN

## 2022-01-17 MED ORDER — ONDANSETRON HCL 4 MG/2ML IJ SOLN
4.0000 mg | Freq: Once | INTRAMUSCULAR | Status: AC
  Administered 2022-01-17: 4 mg via INTRAVENOUS
  Filled 2022-01-17: qty 2

## 2022-01-17 MED ORDER — MORPHINE SULFATE (PF) 2 MG/ML IV SOLN
2.0000 mg | INTRAVENOUS | Status: DC | PRN
  Administered 2022-01-18: 2 mg via INTRAVENOUS
  Filled 2022-01-17: qty 1

## 2022-01-17 MED ORDER — ACETAMINOPHEN 325 MG PO TABS
650.0000 mg | ORAL_TABLET | Freq: Four times a day (QID) | ORAL | Status: DC | PRN
  Administered 2022-01-17 – 2022-01-19 (×3): 650 mg via ORAL
  Filled 2022-01-17 (×3): qty 2

## 2022-01-17 NOTE — Progress Notes (Signed)
CT reviewed showing LEFT LC1 pelvis fracture. She can WBAT with a walker. Follow up in 2-3 weeks for x-rays. Full consult to follow.

## 2022-01-17 NOTE — Assessment & Plan Note (Signed)
-  Generally healthy patient presenting with mechanical fall, multiple pelvis fractures -Unfortunately, there is generally little to be done for these painful fractures -Touch-down WBAT -PT/OT -Pain control -Orthopedics consult -Will observe for now -If unable to progress ambulation, she may need placement of rehab

## 2022-01-17 NOTE — ED Provider Notes (Signed)
[facility] EMERGENCY DEPARTMENT Provider Note   CSN: [id number] Arrival date & time: 01/17/22  6440     History  Chief Complaint  Patient presents with   [name]    Julia Kerr is a 88 y.o. female.   Fall Pertinent negatives include no headaches.  Patient has history of anemia and chronic kidney disease. Patient presents to the ER for evaluation after fall.  Patient was sewing last evening when it seems like the chair gave way and she fell landing on her left hip.  Family was able to get the patient up initially and she got into bed.  This morning however when she woke up she was having pain in the left hip and had trouble standing.  Normally she is able to walk.  There is no headache or head injury.  No neck pain or back pain.  Patient states the pain is only in the left hip.  Home Medications Prior to Admission medications   Medication Sig Start Date End Date Taking? Authorizing Provider  acetaminophen (TYLENOL) 500 MG tablet Take 500 mg by mouth every 6 (six) hours as needed for mild pain.    [provider]  Polyethylene Glycol 400 (VISINE DRY EYE RELIEF OP) Place 1 drop into both eyes as needed (dry eyes).    [provider]  triamcinolone (NASACORT) 55 MCG/ACT AERO nasal inhaler Place 1 spray into the nose 2 (two) times daily. 09/24/21   [name], PA-C  triamcinolone cream (KENALOG) 0.1 % Apply 1 application topically 2 (two) times daily. Patient not taking: No sig reported 05/15/21   Wieters, Elesa Hacker, PA-C      Allergies    Food    Review of Systems   Review of Systems  Constitutional:  Negative for fever.  Neurological:  Negative for light-headedness and headaches.   Physical Exam Updated Vital Signs BP (!) 144/70    Pulse 81    Temp 97.9 F (36.6 C)    Resp (!) 21    Ht 1.422 m (4\' 8" )    Wt 39.5 kg    SpO2 100%    BMI 19.51 kg/m  Physical Exam Vitals and nursing note reviewed.  Constitutional:      General: She is not  in acute distress.    Appearance: She is well-developed.  HENT:     Head: Normocephalic and atraumatic.     Right Ear: External ear normal.     Left Ear: External ear normal.  Eyes:     General: No scleral icterus.       Right eye: No discharge.        Left eye: No discharge.     Conjunctiva/sclera: Conjunctivae normal.  Neck:     Trachea: No tracheal deviation.  Cardiovascular:     Rate and Rhythm: Normal rate and regular rhythm.  Pulmonary:     Effort: Pulmonary effort is normal. No respiratory distress.     Breath sounds: Normal breath sounds. No stridor. No wheezing or rales.  Abdominal:     General: Bowel sounds are normal. There is no distension.     Palpations: Abdomen is soft.     Tenderness: There is no abdominal tenderness. There is no guarding or rebound.  Musculoskeletal:        General: Tenderness present. No deformity.     Cervical back: Neck supple.     Comments: Tenderness palpation left hip, no shortening of the extremity noted; no tenderness cervical thoracic or  lumbar spine  Skin:    General: Skin is warm and dry.     Findings: No rash.  Neurological:     General: No focal deficit present.     Mental Status: She is alert.     Cranial Nerves: No cranial nerve deficit (no facial droop, extraocular movements intact, no slurred speech).     Sensory: No sensory deficit.     Motor: No abnormal muscle tone or seizure activity.     Coordination: Coordination normal.  Psychiatric:        Mood and Affect: Mood normal.    ED Results / Procedures / Treatments   Labs (all labs ordered are listed, but only abnormal results are displayed) Labs Reviewed  BASIC METABOLIC PANEL - Abnormal; Notable for the following components:      Result Value   CO2 18 (*)    Glucose, Bld 142 (*)    BUN 42 (*)    Creatinine, Ser 1.61 (*)    Calcium 8.5 (*)    GFR, Estimated 29 (*)    All other components within normal limits  CBC WITH DIFFERENTIAL/PLATELET - Abnormal; Notable  for the following components:   WBC 13.3 (*)    RBC 2.96 (*)    Hemoglobin 8.5 (*)    HCT 28.5 (*)    MCHC 29.8 (*)    RDW 18.7 (*)    Neutro Abs 8.9 (*)    Monocytes Absolute 2.8 (*)    Abs Immature Granulocytes 0.56 (*)    All other components within normal limits  PROTIME-INR - Abnormal; Notable for the following components:   Prothrombin Time 15.6 (*)    All other components within normal limits  TYPE AND SCREEN    EKG None  Radiology DG Knee Left Port  Result Date: 01/17/2022 CLINICAL DATA:  88 year old female with history of fall yesterday evening. Left knee pain. EXAM: PORTABLE LEFT KNEE - 1-2 VIEW COMPARISON:  No priors. FINDINGS: No evidence of fracture, dislocation, or joint effusion. No evidence of arthropathy or other focal bone abnormality. Numerous vascular calcifications. Soft tissues are unremarkable. IMPRESSION: 1. No acute radiographic abnormality of the left knee. 2. Atherosclerosis. Electronically Signed   By: Vinnie Langton M.D.   On: 01/17/2022 10:19   DG Hip Unilat With Pelvis 2-3 Views Left  Result Date: 01/17/2022 CLINICAL DATA:  Fall left hip pain, limited range of motion. EXAM: DG HIP (WITH OR WITHOUT PELVIS) 2-3V LEFT COMPARISON:  None. FINDINGS: Diffuse demineralization of bone. Comminuted displaced fractures of the left inferior and superior pubic rami. Probable nondisplaced left acetabular fracture. No visualized proximal femur fracture. Vascular calcifications. IMPRESSION: 1. Comminuted displaced fractures of the left inferior and superior pubic rami. 2. Probable nondisplaced left acetabular fracture Electronically Signed   By: Dahlia Bailiff M.D.   On: 01/17/2022 09:00    Procedures Procedures    Medications Ordered in ED Medications  morphine 2 MG/ML injection 2 mg (has no administration in time range)  morphine 2 MG/ML injection 2 mg (2 mg Intravenous Given 01/17/22 0820)  ondansetron (ZOFRAN) injection 4 mg (4 mg Intravenous Given 01/17/22  0820)    ED Course/ Medical Decision Making/ A&P Clinical Course as of 01/17/22 1036  Sun Jan 17, 2022  1610 Basic metabolic panel(!) Creatinine increased from 1.44, 3 months ago bicarb also decreased [JK]  0925 CBC WITH DIFFERENTIAL(!) Hemoglobin decreased at 8.5 but not significantly changed compared to hemoglobin performed 4 days ago.  White blood cell count noted to  be elevated but no signs of infection [JK]  0927 Hip and pelvis films reviewed.  Patient has evidence of pubic rami fracture and possible acetabular fracture. [JK]  0940 D/w Dr Lyla Glassing regarding injuries.  Touch down weight bearing allowed.  He will order CT scans, see patient in the am [ZO]  1096 Discussed with Dr. Lorin Mercy regarding admission [JK]    Clinical Course User Index [JK] Dorie Rank, MD                           Medical Decision Making Amount and/or Complexity of Data Reviewed Labs: ordered. Decision-making details documented in ED Course. Radiology: ordered.  Risk Prescription drug management.   Pubic rami fracture Patient presented with complaints of hip pain.  X-rays do not show hip fracture but she does have pubic rami fracture.  Patient is required IV morphine for pain control.  She is not able to ambulate.  I did discuss the case with Dr. Lyla Glassing.  He has recommended additional imaging.  He will follow up on that.  Patient can weight-bear in these injuries are not likely to require any surgical intervention.  He will see the patient formally in consultation  Anemia Chronic in nature.  Hemoglobin is stable  CKD Chronic without any acute changes.  Overall appears to be mechanical fall with pubic rami fracture.  Patient lives at home and is now not able to ambulate.  I think she will likely require PT, pain management.  I will consult the medical service for admission        Final Clinical Impression(s) / ED Diagnoses Final diagnoses:  Closed fracture of multiple pubic rami, left, initial  encounter ([place])  Anemia, unspecified type  Chronic kidney disease, unspecified CKD stage      Dorie Rank, MD 01/17/22 1036

## 2022-01-17 NOTE — Plan of Care (Signed)

## 2022-01-17 NOTE — ED Notes (Signed)
Patient transported to X-ray 

## 2022-01-17 NOTE — ED Triage Notes (Signed)
Per son, pt fell out of chair last night while sewing.  He is unsure if chair gave way causing pt to fall or why she fell.  C/o L hip pain.  Denies LOC.  No blood thinners.

## 2022-01-17 NOTE — ED Notes (Signed)
Son stated pt kidney's are working at 39%

## 2022-01-17 NOTE — H&P (Signed)
History and Physical    PatientJonesha Kerr JIR:[id number] DOB: Dec 10, 1923 DOA: 01/17/2022 DOS: the patient was seen and examined on 01/17/2022 PCP: [name], PA-C  Patient coming from: Home - lives with family; NOK: [name], [phone]   Chief Complaint: [name]  HPI: Julia Kerr is a 88 y.o. female with medical history significant of anemia associated with stage 3b CKD presenting with a fall.   The patient is Spanish-speaking and history was performed with my limited Spanish and through her son.  He reports that she was sewing last night and slipped out of her chair.  Family helped her to bed but she was unable to get up and move around today and so they brought her in for evaluation.  She currently complains of L flank/hip pain.     ER Course:   Needs pain management, PT for pubic rami fracture.  Sewing last night and fell out of chair.  No syncope/LOC.  Family got back into bed but significant pain today, unable to stand/walk.  Pubic rami fractures, ?acetabular fracture.  Dr. Lyla Glassing to see tomorrow.  Touch-down WBAT.    Review of Systems: As mentioned in the history of present illness. All other systems reviewed and are negative. Past Medical History:  Diagnosis Date   Anemia    Past Surgical History:  Procedure Laterality Date   CATARACT EXTRACTION, BILATERAL     SHOULDER SURGERY     Social History:  reports that she has never smoked. She has never used smokeless tobacco. She reports that she does not drink alcohol and does not use drugs.  Allergies  Allergen Reactions   Food Other (See Comments)    Apples and strawberries cause burning of lips    History reviewed. No pertinent family history.  Prior to Admission medications   Medication Sig Start Date End Date Taking? Authorizing Provider  acetaminophen (TYLENOL) 500 MG tablet Take 500 mg by mouth every 6 (six) hours as needed for mild pain.    [provider]  Polyethylene Glycol 400 (VISINE  DRY EYE RELIEF OP) Place 1 drop into both eyes as needed (dry eyes).    [provider]  triamcinolone (NASACORT) 55 MCG/ACT AERO nasal inhaler Place 1 spray into the nose 2 (two) times daily. 09/24/21   [name], PA-C  triamcinolone cream (KENALOG) 0.1 % Apply 1 application topically 2 (two) times daily. Patient not taking: No sig reported 05/15/21   [name], [place]    Physical Exam: Vitals:   01/17/22 1000 01/17/22 1130 01/17/22 1300 01/17/22 1400  BP: (!) 144/70 (!) 128/55 119/61 (!) 127/53  Pulse: 81 79 85 78  Resp: (!) 21 (!) 22 20 15   Temp:      SpO2: 100% 100% 100% 99%  Weight:      Height:       General:  Appears calm and comfortable and is in NAD Eyes:  PERRL, EOMI, normal lids, iris ENT:  grossly normal hearing, lips & tongue, mmm Neck:  no LAD, masses or thyromegaly Cardiovascular:  RRR, no m/r/g. No LE edema.  Respiratory:   CTA bilaterally with no wheezes/rales/rhonchi.  Normal respiratory effort. Abdomen:  soft, NT, ND Skin:  no rash or induration seen on limited exam Musculoskeletal:  able to move feet but limited ROM of L > R leg due to pain Psychiatric:  grossly normal mood and affect, speech fluent and appropriate, AOx3 Neurologic:  CN 2-12 grossly intact   Radiological Exams on Admission: Independently  reviewed - see discussion in A/P where applicable  CT HIP LEFT WO CONTRAST  Result Date: 01/17/2022 CLINICAL DATA:  Left hip pain.  Left pubic rami fractures EXAM: CT OF THE LEFT HIP WITHOUT CONTRAST TECHNIQUE: Multidetector CT imaging of the left hip was performed according to the standard protocol. Multiplanar CT image reconstructions were also generated. RADIATION DOSE REDUCTION: This exam was performed according to the departmental dose-optimization program which includes automated exposure control, adjustment of the mA and/or kV according to patient size and/or use of iterative reconstruction technique. COMPARISON:  Same-day x-ray  FINDINGS: Bones/Joint/Cartilage Multiple acute fractures of the left hemipelvis. Comminuted moderately displaced fractures of the left superior pubic ramus and parasymphyseal left pubic bone. Mildly displaced left inferior pubic ramus fracture. Acute essentially nondisplaced fractures along the inferior margin of the left sacroiliac joint involving both the sacrum and ilium (series 6, images 57-66). Left SI joint is partially ankylosed along its superior margin. No left SI joint or pubic symphysis diastasis. Left proximal femur intact without fracture. Left hip joint intact without fracture or dislocation. No acetabular fracture. Bones are diffusely demineralized. Ligaments Suboptimally assessed by CT. Muscles and Tendons No acute musculotendinous abnormality by CT. Soft tissues Small amount of ill-defined fluid along the left pelvic sidewall compatible with hematoma. Additional small anterior pelvic hematoma adjacent to the parasymphyseal fracture site. Extensive atherosclerotic vascular calcifications. Colonic diverticulosis. IMPRESSION: 1. Multiple acute fractures of the left hemipelvis including the left superior and inferior pubic rami as well as the sacrum and ilium adjacent to the inferior margin of the left SI joint. 2. No left SI joint or pubic symphysis diastasis. 3. Small amount of ill-defined left pelvic sidewall hematoma. Small anterior pelvic hematoma 4. Left hip intact without fracture or dislocation. Electronically Signed   By: Davina Poke D.O.   On: 01/17/2022 10:59   DG Knee Left Port  Result Date: 01/17/2022 CLINICAL DATA:  88 year old female with history of fall yesterday evening. Left knee pain. EXAM: PORTABLE LEFT KNEE - 1-2 VIEW COMPARISON:  No priors. FINDINGS: No evidence of fracture, dislocation, or joint effusion. No evidence of arthropathy or other focal bone abnormality. Numerous vascular calcifications. Soft tissues are unremarkable. IMPRESSION: 1. No acute radiographic  abnormality of the left knee. 2. Atherosclerosis. Electronically Signed   By: Vinnie Langton M.D.   On: 01/17/2022 10:19   DG Hip Unilat With Pelvis 2-3 Views Left  Result Date: 01/17/2022 CLINICAL DATA:  Fall left hip pain, limited range of motion. EXAM: DG HIP (WITH OR WITHOUT PELVIS) 2-3V LEFT COMPARISON:  None. FINDINGS: Diffuse demineralization of bone. Comminuted displaced fractures of the left inferior and superior pubic rami. Probable nondisplaced left acetabular fracture. No visualized proximal femur fracture. Vascular calcifications. IMPRESSION: 1. Comminuted displaced fractures of the left inferior and superior pubic rami. 2. Probable nondisplaced left acetabular fracture Electronically Signed   By: Dahlia Bailiff M.D.   On: 01/17/2022 09:00    EKG: Independently reviewed.  NSR with rate 80; nonspecific ST changes with no evidence of acute ischemia   Labs on Admission: I have personally reviewed the available labs and imaging studies at the time of the admission.  Pertinent labs:    CO2 18 Glucose 142 BUN 42/Creatinine 1.61/GFR 29 WBC 13.3 Hgb 8.5 - stable   Assessment/Plan * Multiple closed pelvic fractures without disruption of pelvic circle (HCC)- (present on admission) -Generally healthy patient presenting with mechanical fall, multiple pelvis fractures -Unfortunately, there is generally little to be done for these painful fractures -  Touch-down WBAT -PT/OT -Pain control -Orthopedics consult -Will observe for now -If unable to progress ambulation, she may need placement of rehab  Anemia associated with chronic renal failure- (present on admission) -Seen by oncology Dr. Lorenso Courier in 09/2021 -She was referred to nephrology and started on Epo injections -Appears to be stable at this time -Recheck in AM since CT does show small pelvic hematomas -Will use SCDs rather than Lovenox  CKD (chronic kidney disease), stage III (No Name)- (present on admission) -Stable 3b CKD, not  appreciably worse than usual baseline -Recheck BMP in AM -Would avoid NSAIDs if at all possible    Advance Care Planning:   Code Status: Full Code   Consults: Orthopedics; PT/OT; Nutrition; TOC team  Family Communication: Son was present for completion of evaluation  Severity of Illness: The appropriate patient status for this patient is OBSERVATION. Observation status is judged to be reasonable and necessary in order to provide the required intensity of service to ensure the patient's safety. The patient's presenting symptoms, physical exam findings, and initial radiographic and laboratory data in the context of their medical condition is felt to place them at decreased risk for further clinical deterioration. Furthermore, it is anticipated that the patient will be medically stable for discharge from the hospital within 2 midnights of admission.   Author: Karmen Bongo, MD 01/17/2022 2:21 PM  For on call review [email or web address].

## 2022-01-17 NOTE — Assessment & Plan Note (Signed)
-  Seen by oncology Dr. Lorenso Courier in 09/2021 -She was referred to nephrology and started on Epo injections -Appears to be stable at this time -Recheck in AM since CT does show small pelvic hematomas -Will use SCDs rather than Lovenox

## 2022-01-17 NOTE — Assessment & Plan Note (Signed)
-  Stable 3b CKD, not appreciably worse than usual baseline -Recheck BMP in AM -Would avoid NSAIDs if at all possible

## 2022-01-17 NOTE — ED Notes (Signed)
RN removed pt underwear and placed brief with purewick

## 2022-01-17 NOTE — Progress Notes (Signed)
Patient received from [place].  Patient is awake, alert and oriented, Spanish speaking.  Patient expressed pain when moving, assisted in bed in position of comfort.  Call bell within reach.  Needs addressed.  Kept dry and comfortable.

## 2022-01-18 DIAGNOSIS — Y92019 Unspecified place in single-family (private) house as the place of occurrence of the external cause: Secondary | ICD-10-CM | POA: Diagnosis not present

## 2022-01-18 DIAGNOSIS — D62 Acute posthemorrhagic anemia: Secondary | ICD-10-CM | POA: Diagnosis present

## 2022-01-18 DIAGNOSIS — D631 Anemia in chronic kidney disease: Secondary | ICD-10-CM | POA: Diagnosis present

## 2022-01-18 DIAGNOSIS — S3282XA Multiple fractures of pelvis without disruption of pelvic ring, initial encounter for closed fracture: Secondary | ICD-10-CM | POA: Diagnosis present

## 2022-01-18 DIAGNOSIS — E860 Dehydration: Secondary | ICD-10-CM | POA: Diagnosis present

## 2022-01-18 DIAGNOSIS — W07XXXA Fall from chair, initial encounter: Secondary | ICD-10-CM | POA: Diagnosis present

## 2022-01-18 DIAGNOSIS — D72828 Other elevated white blood cell count: Secondary | ICD-10-CM | POA: Diagnosis present

## 2022-01-18 DIAGNOSIS — N1832 Chronic kidney disease, stage 3b: Secondary | ICD-10-CM | POA: Diagnosis present

## 2022-01-18 DIAGNOSIS — N179 Acute kidney failure, unspecified: Secondary | ICD-10-CM | POA: Diagnosis present

## 2022-01-18 DIAGNOSIS — Y93D2 Activity, sewing: Secondary | ICD-10-CM | POA: Diagnosis not present

## 2022-01-18 DIAGNOSIS — Z20822 Contact with and (suspected) exposure to covid-19: Secondary | ICD-10-CM | POA: Diagnosis present

## 2022-01-18 LAB — HEMOGLOBIN AND HEMATOCRIT, BLOOD
HCT: 23.7 % — ABNORMAL LOW (ref 36.0–46.0)
Hemoglobin: 7.2 g/dL — ABNORMAL LOW (ref 12.0–15.0)

## 2022-01-18 LAB — BASIC METABOLIC PANEL
Anion gap: 9 (ref 5–15)
BUN: 44 mg/dL — ABNORMAL HIGH (ref 8–23)
CO2: 23 mmol/L (ref 22–32)
Calcium: 8 mg/dL — ABNORMAL LOW (ref 8.9–10.3)
Chloride: 109 mmol/L (ref 98–111)
Creatinine, Ser: 1.85 mg/dL — ABNORMAL HIGH (ref 0.44–1.00)
GFR, Estimated: 25 mL/min — ABNORMAL LOW (ref >60)
Glucose, Bld: 84 mg/dL (ref 70–99)
Potassium: 4.7 mmol/L (ref 3.5–5.1)
Sodium: 141 mmol/L (ref 135–145)

## 2022-01-18 LAB — CBC
HCT: 24.8 % — ABNORMAL LOW (ref 36.0–46.0)
Hemoglobin: 7.2 g/dL — ABNORMAL LOW (ref 12.0–15.0)
MCH: 27.4 pg (ref 26.0–34.0)
MCHC: 29 g/dL — ABNORMAL LOW (ref 30.0–36.0)
MCV: 94.3 fL (ref 80.0–100.0)
Platelets: 170 10*3/uL (ref 150–400)
RBC: 2.63 MIL/uL — ABNORMAL LOW (ref 3.87–5.11)
RDW: 18.9 % — ABNORMAL HIGH (ref 11.5–15.5)
WBC: 11.3 10*3/uL — ABNORMAL HIGH (ref 4.0–10.5)
nRBC: 0.2 % (ref 0.0–0.2)

## 2022-01-18 MED ORDER — ADULT MULTIVITAMIN W/MINERALS CH
1.0000 | ORAL_TABLET | Freq: Every day | ORAL | Status: DC
  Administered 2022-01-18 – 2022-01-19 (×2): 1 via ORAL
  Filled 2022-01-18 (×2): qty 1

## 2022-01-18 MED ORDER — ENSURE ENLIVE PO LIQD
237.0000 mL | Freq: Two times a day (BID) | ORAL | Status: DC
  Administered 2022-01-18 – 2022-01-19 (×3): 237 mL via ORAL

## 2022-01-18 MED ORDER — SENNOSIDES-DOCUSATE SODIUM 8.6-50 MG PO TABS
2.0000 | ORAL_TABLET | Freq: Every day | ORAL | Status: DC
  Administered 2022-01-18: 2 via ORAL
  Filled 2022-01-18: qty 2

## 2022-01-18 NOTE — Consult Note (Signed)
Reason for Consult:Pelvic fxs Referring Physician: Irene Pap Time called: 2703 Time at bedside: Julia Kerr is an 88 y.o. female.  HPI: Julia Kerr was at home and fell off a narrow bench when it shifted under her. She had immediate pelvic pain and had to crawl to a phone to get help. She was brought to the ED where x-rays showed left pubic rami fxs and orthopedic surgery was consulted. She lives alone and ambulates with a cane.  Past Medical History:  Diagnosis Date   Anemia     Past Surgical History:  Procedure Laterality Date   CATARACT EXTRACTION, BILATERAL     SHOULDER SURGERY      History reviewed. No pertinent family history.  Social History:  reports that she has never smoked. She has never used smokeless tobacco. She reports that she does not drink alcohol and does not use drugs.  Allergies:  Allergies  Allergen Reactions   Food Other (See Comments)    Apples and strawberries cause burning of lips    Medications: I have reviewed the patient's current medications.  Results for orders placed or performed during the hospital encounter of 01/17/22 (from the past 48 hour(s))  Basic metabolic panel     Status: Abnormal   Collection Time: 01/17/22  8:00 AM  Result Value Ref Range   Sodium 138 135 - 145 mmol/L   Potassium 4.3 3.5 - 5.1 mmol/L   Chloride 109 98 - 111 mmol/L   CO2 18 (L) 22 - 32 mmol/L   Glucose, Bld 142 (H) 70 - 99 mg/dL    Comment: Glucose reference range applies only to samples taken after fasting for at least 8 hours.   BUN 42 (H) 8 - 23 mg/dL   Creatinine, Ser 1.61 (H) 0.44 - 1.00 mg/dL   Calcium 8.5 (L) 8.9 - 10.3 mg/dL   GFR, Estimated 29 (L) >60 mL/min    Comment: (NOTE) Calculated using the CKD-EPI Creatinine Equation (2021)    Anion gap 11 5 - 15    Comment: Performed at [facility] [address], [place], [place] [address]  CBC WITH DIFFERENTIAL     Status: Abnormal   Collection Time: 01/17/22  8:00 AM  Result Value Ref  Range   WBC 13.3 (H) 4.0 - 10.5 K/uL   RBC 2.96 (L) 3.87 - 5.11 MIL/uL   Hemoglobin 8.5 (L) 12.0 - 15.0 g/dL   HCT 28.5 (L) 36.0 - 46.0 %   MCV 96.3 80.0 - 100.0 fL   MCH 28.7 26.0 - 34.0 pg   MCHC 29.8 (L) 30.0 - 36.0 g/dL   RDW 18.7 (H) 11.5 - 15.5 %   Platelets 178 150 - 400 K/uL   nRBC 0.0 0.0 - 0.2 %   Neutrophils Relative % 67 %   Neutro Abs 8.9 (H) 1.7 - 7.7 K/uL   Lymphocytes Relative 8 %   Lymphs Abs 1.1 0.7 - 4.0 K/uL   Monocytes Relative 21 %   Monocytes Absolute 2.8 (H) 0.1 - 1.0 K/uL   Eosinophils Relative 0 %   Eosinophils Absolute 0.0 0.0 - 0.5 K/uL   Basophils Relative 0 %   Basophils Absolute 0.0 0.0 - 0.1 K/uL   Immature Granulocytes 4 %   Abs Immature Granulocytes 0.56 (H) 0.00 - 0.07 K/uL    Comment: Performed at [facility], [address], [place], [place] [address]  Protime-INR     Status: Abnormal   Collection Time: 01/17/22  8:00 AM  Result Value Ref Range   Prothrombin Time 15.6 (H) 11.4 - 15.2 seconds   INR 1.2 0.8 - 1.2    Comment: (NOTE) INR goal varies based on device and disease states. Performed at [facility], [place] [address] [place], [place] [address]   Type and screen [place]     Status: None   Collection Time: 01/17/22  8:23 AM  Result Value Ref Range   ABO/RH(D) A POS    Antibody Screen NEG    Sample Expiration      01/20/2022,2359 Performed at [facility], [place] [address] [place],  [address]   Resp Panel by RT-PCR (Flu A&B, Covid) Nasopharyngeal Swab     Status: None   Collection Time: 01/17/22 11:14 AM   Specimen: Nasopharyngeal Swab; Nasopharyngeal(NP) swabs in vial transport medium  Result Value Ref Range   SARS Coronavirus 2 by RT PCR NEGATIVE NEGATIVE    Comment: (NOTE) SARS-CoV-2 target nucleic acids are NOT DETECTED.  The SARS-CoV-2 RNA is generally detectable in upper respiratory specimens during the acute phase of infection. The lowest concentration of SARS-CoV-2 viral  copies this assay can detect is 138 copies/mL. A negative result does not preclude SARS-Cov-2 infection and should not be used as the sole basis for treatment or other patient management decisions. A negative result may occur with  improper specimen collection/handling, submission of specimen other than nasopharyngeal swab, presence of viral mutation(s) within the areas targeted by this assay, and inadequate number of viral copies(<138 copies/mL). A negative result must be combined with clinical observations, patient history, and epidemiological information. The expected result is Negative.  Fact Sheet for Patients:  EntrepreneurPulse.com.au  Fact Sheet for Healthcare Providers:  IncredibleEmployment.be  This test is no t yet approved or cleared by the [place] FDA and  has been authorized for detection and/or diagnosis of SARS-CoV-2 by FDA under an Emergency Use Authorization (EUA). This EUA will remain  in effect (meaning this test can be used) for the duration of the COVID-19 declaration under Section 564(b)(1) of the Act, 21 U.S.C.section 360bbb-3(b)(1), unless the authorization is terminated  or revoked sooner.       Influenza A by PCR NEGATIVE NEGATIVE   Influenza B by PCR NEGATIVE NEGATIVE    Comment: (NOTE) The Xpert Xpress SARS-CoV-2/FLU/RSV plus assay is intended as an aid in the diagnosis of influenza from Nasopharyngeal swab specimens and should not be used as a sole basis for treatment. Nasal washings and aspirates are unacceptable for Xpert Xpress SARS-CoV-2/FLU/RSV testing.  Fact Sheet for Patients: EntrepreneurPulse.com.au  Fact Sheet for Healthcare Providers: IncredibleEmployment.be  This test is not yet approved or cleared by the [place] FDA and has been authorized for detection and/or diagnosis of SARS-CoV-2 by FDA under an Emergency Use Authorization (EUA). This EUA will  remain in effect (meaning this test can be used) for the duration of the COVID-19 declaration under Section 564(b)(1) of the Act, 21 U.S.C. section 360bbb-3(b)(1), unless the authorization is terminated or revoked.  Performed at [facility], [place] [address], [place] [address]   CBC     Status: Abnormal   Collection Time: 01/18/22  7:02 AM  Result Value Ref Range   WBC 11.3 (H) 4.0 - 10.5 K/uL   RBC 2.63 (L) 3.87 - 5.11 MIL/uL   Hemoglobin 7.2 (L) 12.0 - 15.0 g/dL   HCT 24.8 (L) 36.0 - 46.0 %   MCV 94.3 80.0 -  100.0 fL   MCH 27.4 26.0 - 34.0 pg   MCHC 29.0 (L) 30.0 - 36.0 g/dL   RDW 18.9 (H) 11.5 - 15.5 %   Platelets 170 150 - 400 K/uL   nRBC 0.2 0.0 - 0.2 %    Comment: Performed at [facility] [address], [place], [place] [address]  Basic metabolic panel     Status: Abnormal   Collection Time: 01/18/22  7:02 AM  Result Value Ref Range   Sodium 141 135 - 145 mmol/L   Potassium 4.7 3.5 - 5.1 mmol/L   Chloride 109 98 - 111 mmol/L   CO2 23 22 - 32 mmol/L   Glucose, Bld 84 70 - 99 mg/dL    Comment: Glucose reference range applies only to samples taken after fasting for at least 8 hours.   BUN 44 (H) 8 - 23 mg/dL   Creatinine, Ser 1.85 (H) 0.44 - 1.00 mg/dL   Calcium 8.0 (L) 8.9 - 10.3 mg/dL   GFR, Estimated 25 (L) >60 mL/min    Comment: (NOTE) Calculated using the CKD-EPI Creatinine Equation (2021)    Anion gap 9 5 - 15    Comment: Performed at [facility] [address] [place], [place] [address]    CT HIP LEFT WO CONTRAST  Result Date: 01/17/2022 CLINICAL DATA:  Left hip pain.  Left pubic rami fractures EXAM: CT OF THE LEFT HIP WITHOUT CONTRAST TECHNIQUE: Multidetector CT imaging of the left hip was performed according to the standard protocol. Multiplanar CT image reconstructions were also generated. RADIATION DOSE REDUCTION: This exam was performed according to the departmental dose-optimization program which includes automated exposure  control, adjustment of the mA and/or kV according to patient size and/or use of iterative reconstruction technique. COMPARISON:  Same-day x-ray FINDINGS: Bones/Joint/Cartilage Multiple acute fractures of the left hemipelvis. Comminuted moderately displaced fractures of the left superior pubic ramus and parasymphyseal left pubic bone. Mildly displaced left inferior pubic ramus fracture. Acute essentially nondisplaced fractures along the inferior margin of the left sacroiliac joint involving both the sacrum and ilium (series 6, images 57-66). Left SI joint is partially ankylosed along its superior margin. No left SI joint or pubic symphysis diastasis. Left proximal femur intact without fracture. Left hip joint intact without fracture or dislocation. No acetabular fracture. Bones are diffusely demineralized. Ligaments Suboptimally assessed by CT. Muscles and Tendons No acute musculotendinous abnormality by CT. Soft tissues Small amount of ill-defined fluid along the left pelvic sidewall compatible with hematoma. Additional small anterior pelvic hematoma adjacent to the parasymphyseal fracture site. Extensive atherosclerotic vascular calcifications. Colonic diverticulosis. IMPRESSION: 1. Multiple acute fractures of the left hemipelvis including the left superior and inferior pubic rami as well as the sacrum and ilium adjacent to the inferior margin of the left SI joint. 2. No left SI joint or pubic symphysis diastasis. 3. Small amount of ill-defined left pelvic sidewall hematoma. Small anterior pelvic hematoma 4. Left hip intact without fracture or dislocation. Electronically Signed   By: Davina Poke D.O.   On: 01/17/2022 10:59   DG Knee Left Port  Result Date: 01/17/2022 CLINICAL DATA:  88 year old female with history of fall yesterday evening. Left knee pain. EXAM: PORTABLE LEFT KNEE - 1-2 VIEW COMPARISON:  No priors. FINDINGS: No evidence of fracture, dislocation, or joint effusion. No evidence of  arthropathy or other focal bone abnormality. Numerous vascular calcifications. Soft tissues are unremarkable. IMPRESSION: 1. No acute radiographic abnormality of the left knee. 2. Atherosclerosis.  Electronically Signed   By: Vinnie Langton M.D.   On: 01/17/2022 10:19   DG Hip Unilat With Pelvis 2-3 Views Left  Result Date: 01/17/2022 CLINICAL DATA:  Fall left hip pain, limited range of motion. EXAM: DG HIP (WITH OR WITHOUT PELVIS) 2-3V LEFT COMPARISON:  None. FINDINGS: Diffuse demineralization of bone. Comminuted displaced fractures of the left inferior and superior pubic rami. Probable nondisplaced left acetabular fracture. No visualized proximal femur fracture. Vascular calcifications. IMPRESSION: 1. Comminuted displaced fractures of the left inferior and superior pubic rami. 2. Probable nondisplaced left acetabular fracture Electronically Signed   By: Dahlia Bailiff M.D.   On: 01/17/2022 09:00    Review of Systems  HENT:  Negative for ear discharge, ear pain, hearing loss and tinnitus.   Eyes:  Negative for photophobia and pain.  Respiratory:  Negative for cough and shortness of breath.   Cardiovascular:  Negative for chest pain.  Gastrointestinal:  Negative for abdominal pain, nausea and vomiting.  Genitourinary:  Negative for dysuria, flank pain, frequency and urgency.  Musculoskeletal:  Positive for arthralgias (Pelvis). Negative for back pain, myalgias and neck pain.  Neurological:  Negative for dizziness and headaches.  Hematological:  Does not bruise/bleed easily.  Psychiatric/Behavioral:  The patient is not nervous/anxious.   Blood pressure 108/60, pulse 83, temperature 98 F (36.7 C), resp. rate 16, height 4\' 8"  (1.422 m), weight 38.2 kg, SpO2 99 %. Physical Exam Constitutional:      General: She is not in acute distress.    Appearance: She is well-developed. She is not diaphoretic.  HENT:     Head: Normocephalic and atraumatic.  Eyes:     General: No scleral icterus.        Right eye: No discharge.        Left eye: No discharge.     Conjunctiva/sclera: Conjunctivae normal.  Cardiovascular:     Rate and Rhythm: Normal rate and regular rhythm.  Pulmonary:     Effort: Pulmonary effort is normal. No respiratory distress.  Musculoskeletal:     Cervical back: Normal range of motion.     Comments: Pelvis--no traumatic wounds or rash, no ecchymosis, stable to manual stress, nontender  BLE No traumatic wounds, ecchymosis, or rash  Nontender  No knee or ankle effusion  Knee stable to varus/ valgus and anterior/posterior stress  Sens DPN, SPN, TN intact  Motor EHL, ext, flex, evers 5/5  DP 1+, PT 0, No significant edema  Skin:    General: Skin is warm and dry.  Neurological:     Mental Status: She is alert.  Psychiatric:        Mood and Affect: Mood normal.        Behavior: Behavior normal.    Assessment/Plan: Pelvic fxs -- Pt may be WBAT BLE. F/u with Dr. Lyla Glassing in 3 weeks.    Lisette Abu, PA-C Orthopedic Surgery [phone] 01/18/2022, 9:04 AM

## 2022-01-18 NOTE — NC FL2 (Signed)
[place] LEVEL OF CARE SCREENING TOOL     IDENTIFICATION  Patient Name: Julia Kerr Birthdate: 07-31-1923 Sex: female Admission Date (Current Location): 01/17/2022  [facility] and [place] Number:  Herbalist and Address:  [facility], [place] [address], [place], [place] [address]      Provider Number: [id number]  Attending Physician Name and Address:  Kayleen Memos, DO  Relative Name and Phone Number:  [name]   [phone]    Current Level of Care: Hospital Recommended Level of Care: [place] Prior Approval Number:    Date Approved/Denied:   PASRR Number: [id number]  Discharge Plan: SNF    Current Diagnoses: Patient Active Problem List   Diagnosis Date Noted   Multiple closed pelvic fractures without disruption of pelvic ring ([place]) 01/18/2022   Multiple closed pelvic fractures without disruption of pelvic circle ([place]) 01/17/2022   Anemia associated with chronic renal failure 01/17/2022   Seasonal allergic rhinitis 04/12/2021   Left sided sciatica 04/12/2021   Chronic rhinitis 02/06/2020   Adverse food reaction 02/06/2020   Vaccine counseling 02/06/2020   Cellulitis of lower extremity 08/18/2019   Insect bite 08/01/2019   Low serum thyroid stimulating hormone (TSH) 08/17/2018   Normocytic normochromic anemia 08/17/2018   CKD (chronic kidney disease), stage III ([place]) 08/17/2018   Dehydration, moderate 08/17/2018   Low HDL (under 40) 08/17/2018   Vitamin D deficiency 08/04/2018   B12 deficiency-requiring injections in past 08/04/2018   Bruit of right carotid artery 08/04/2018   Choledocholithiasis with chronic cholecystitis- not amenable to surgery due to age 76/22/6333   Biliary colic symptom- intolerant to dairy and fats due to gallstone presence 08/04/2018   Environmental and seasonal allergies 08/04/2018   Low weight for height- bmi 17.5 08/04/2018   Postmenopausal bone loss 08/04/2018    Chronic fatigue- with B12 def 08/04/2018    Orientation RESPIRATION BLADDER Height & Weight     Self, Time, Situation, Place  Normal Continent, External catheter Weight: 84 lb 3.5 oz (38.2 kg) Height:  4\' 8"  (142.2 cm)  BEHAVIORAL SYMPTOMS/MOOD NEUROLOGICAL BOWEL NUTRITION STATUS      Continent Diet (see discharge summary)  AMBULATORY STATUS COMMUNICATION OF NEEDS Skin   Limited Assist Verbally Normal                       Personal Care Assistance Level of Assistance  Bathing, Feeding, Dressing Bathing Assistance: Maximum assistance Feeding assistance: Limited assistance Dressing Assistance: Maximum assistance     Functional Limitations Info  Sight, Hearing, Speech Sight Info: Adequate Hearing Info: Adequate Speech Info: Adequate (spanish speaking)    SPECIAL CARE FACTORS FREQUENCY  PT (By licensed PT), OT (By licensed OT)     PT Frequency: 5x week OT Frequency: 5x week            Contractures Contractures Info: Not present    Additional Factors Info  Code Status, Allergies Code Status Info: full Allergies Info: food           Current Medications (01/18/2022):  This is the current hospital active medication list Current Facility-Administered Medications  Medication Dose Route Frequency Provider Last Rate Last Admin   acetaminophen (TYLENOL) tablet 650 mg  650 mg Oral Q6H PRN Karmen Bongo, MD   650 mg at 01/17/22 1624   bisacodyl (DULCOLAX) EC tablet 5 mg  5 mg Oral Daily PRN Karmen Bongo, MD       feeding  supplement (ENSURE ENLIVE / ENSURE PLUS) liquid 237 mL  237 mL Oral BID BM Karmen Bongo, MD   237 mL at 01/18/22 1341   methocarbamol (ROBAXIN) tablet 500 mg  500 mg Oral Q6H PRN Karmen Bongo, MD   500 mg at 01/17/22 2131   Or   methocarbamol (ROBAXIN) 500 mg in dextrose 5 % 50 mL IVPB  500 mg Intravenous Q6H PRN Karmen Bongo, MD       morphine 2 MG/ML injection 2 mg  2 mg Intravenous Q2H PRN Karmen Bongo, MD   2 mg at 01/18/22 1339    multivitamin with minerals tablet 1 tablet  1 tablet Oral Daily Karmen Bongo, MD   1 tablet at 01/18/22 1335   ondansetron (ZOFRAN) injection 4 mg  4 mg Intravenous Q6H PRN Karmen Bongo, MD       oxyCODONE (Oxy IR/ROXICODONE) immediate release tablet 5-10 mg  5-10 mg Oral Q4H PRN Karmen Bongo, MD   5 mg at 01/18/22 1040   polyethylene glycol (MIRALAX / GLYCOLAX) packet 17 g  17 g Oral Daily PRN Karmen Bongo, MD       polyvinyl alcohol (LIQUIFILM TEARS) 1.4 % ophthalmic solution 1 drop  1 drop Both Eyes PRN Heloise Purpura, RPH       senna-docusate (Senokot-S) tablet 2 tablet  2 tablet Oral QHS Kayleen Memos, DO         Discharge Medications: Please see discharge summary for a list of discharge medications.  Relevant Imaging Results:  Relevant Lab Results:   Additional Information SSN: [id number].  Pt has one covid vaccine shot  Sina Sumpter, Veronia Beets, LCSW

## 2022-01-18 NOTE — Progress Notes (Signed)
PROGRESS NOTE  Julia Kerr NLZ:[id number] DOB: Apr 23, 1923 DOA: 01/17/2022 PCP: [name], PA-C  HPI/Recap of past 24 hours: Julia Kerr is a 88 y.o. female with medical history significant of anemia associated with stage 3b CKD presenting with a mechanical fall, fell off a narrow bench when it shifted under her.   Work-up revealed multiple acute pelvic fractures, small anterior pelvic hematoma, on CT left hip without contrast 01/17/2022.  Seen by orthopedic surgery, recommended conservative management, no plan for surgical intervention.  Seen by PT OT with recommendation for SNF.  TOC consulted to assist with SNF placement.  01/18/2022: Patient was seen and examined at her bedside.  She reports severe left hip pain.  Analgesics in place.  Patient from home, was living by herself.  Will likely require 24/7 supervision, working on SNF placement.  Updated her son via phone.    Assessment/Plan: Principal Problem:   Multiple closed pelvic fractures without disruption of pelvic circle (HCC) Active Problems:   CKD (chronic kidney disease), stage III (HCC)   Anemia associated with chronic renal failure  Multiple acute pelvic fractures without disruption of pelvic circle (HCC)- (present on admission) -Generally healthy patient presenting with mechanical fall, multiple pelvis fractures Fell off a narrow bench when it shifted under her.    Seen by orthopedic surgery, recommended conservative management, no plan for surgical intervention.  Seen by PT OT with recommendation for SNF.  TOC consulted to assist with SNF placement. Orthopedic surgery recommends weightbearing as tolerated. Continue pain control along with bowel regimen.   Acute blood loss anemia in the setting of pelvic fractures, complicated by anemia of chronic disease associated with chronic renal failure- (present on admission) -Seen by oncology Dr. Lorenso Courier in 09/2021 -She was referred to nephrology and started on Epo  injections Drop in hemoglobin 7.2 from baseline of 9 Repeat H&H Transfuse as indicated.   CKD (chronic kidney disease), stage IIIB ([place])- (present on admission) Baseline creatinine appears to be 1.4 with GFR of 33 Creatinine 1.5 with GFR 25 Start gentle IV fluid hydration LR at 50 cc/h x 2 days. Avoid nephrotoxic agents, dehydration and hypotension. Closely monitor urine output with strict I's and O's Repeat renal panel in the morning.  Leukocytosis, suspect reactive in the setting of multiple acute pelvic fractures. Presented with WBC of 13.3, WBC is downtrending Nonseptic appearing and afebrile Continue to monitor WBC and fever curve.     Advance Care Planning:   Code Status: Full Code    Consults: Orthopedics; PT/OT; Nutrition; TOC team   Family Communication: Updated her son via phone on 01/08/2022.         Code Status: Full code   Disposition Plan: Likely DC to SNF if bed is available on 01/09/2022.   Consultants: Orthopedic surgery  Procedures: None.  Antimicrobials: None.  DVT prophylaxis: SCDs  Status is: Observation        Objective: Vitals:   01/17/22 2115 01/17/22 2130 01/18/22 0339 01/18/22 0900  BP: (!) 116/54  108/60 (!) 105/48  Pulse: 84  83 87  Resp: 14  16 17   Temp: (!) 97.4 F (36.3 C) 98 F (36.7 C) 98 F (36.7 C) 99 F (37.2 C)  TempSrc: Oral Oral  Oral  SpO2: 100%  99% 99%  Weight:      Height:        Intake/Output Summary (Last 24 hours) at 01/18/2022 1123 Last data filed at 01/17/2022 1620 Gross per 24 hour  Intake 60 ml  Output --  Net 60 ml   Filed Weights   01/17/22 0748 01/17/22 1620  Weight: 39.5 kg 38.2 kg    Exam:  General: 88 y.o. year-old female well developed well nourished in no acute distress.  Alert and oriented x3. Cardiovascular: Regular rate and rhythm with no rubs or gallops.  No thyromegaly or JVD noted.   Respiratory: Clear to auscultation with no wheezes or rales. Good inspiratory  effort. Abdomen: Soft nontender nondistended with normal bowel sounds x4 quadrants. Musculoskeletal: No lower extremity edema. 2/4 pulses in all 4 extremities. Skin: No ulcerative lesions noted or rashes, Psychiatry: Mood is appropriate for condition and setting   Data Reviewed: CBC: Recent Labs  Lab 01/13/22 0908 01/17/22 0800 01/18/22 0702  WBC  --  13.3* 11.3*  NEUTROABS  --  8.9*  --   HGB 8.6* 8.5* 7.2*  HCT  --  28.5* 24.8*  MCV  --  96.3 94.3  PLT  --  178 096   Basic Metabolic Panel: Recent Labs  Lab 01/17/22 0800 01/18/22 0702  NA 138 141  K 4.3 4.7  CL 109 109  CO2 18* 23  GLUCOSE 142* 84  BUN 42* 44*  CREATININE 1.61* 1.85*  CALCIUM 8.5* 8.0*   GFR: Estimated Creatinine Clearance: 10.2 mL/min (A) (by C-G formula based on SCr of 1.85 mg/dL (H)). Liver Function Tests: No results for input(s): AST, ALT, ALKPHOS, BILITOT, PROT, ALBUMIN in the last 168 hours. No results for input(s): LIPASE, AMYLASE in the last 168 hours. No results for input(s): AMMONIA in the last 168 hours. Coagulation Profile: Recent Labs  Lab 01/17/22 0800  INR 1.2   Cardiac Enzymes: No results for input(s): CKTOTAL, CKMB, CKMBINDEX, TROPONINI in the last 168 hours. BNP (last 3 results) No results for input(s): PROBNP in the last 8760 hours. HbA1C: No results for input(s): HGBA1C in the last 72 hours. CBG: No results for input(s): GLUCAP in the last 168 hours. Lipid Profile: No results for input(s): CHOL, HDL, LDLCALC, TRIG, CHOLHDL, LDLDIRECT in the last 72 hours. Thyroid Function Tests: No results for input(s): TSH, T4TOTAL, FREET4, T3FREE, THYROIDAB in the last 72 hours. Anemia Panel: No results for input(s): VITAMINB12, FOLATE, FERRITIN, TIBC, IRON, RETICCTPCT in the last 72 hours. Urine analysis:    Component Value Date/Time   COLORURINE YELLOW 10/01/2021 0932   APPEARANCEUR CLEAR 10/01/2021 0932   LABSPEC 1.013 10/01/2021 0932   PHURINE 5.0 10/01/2021 0932    GLUCOSEU NEGATIVE 10/01/2021 0932   HGBUR MODERATE (A) 10/01/2021 0932   BILIRUBINUR NEGATIVE 10/01/2021 0932   KETONESUR NEGATIVE 10/01/2021 0932   PROTEINUR 30 (A) 10/01/2021 0932   NITRITE NEGATIVE 10/01/2021 0932   LEUKOCYTESUR NEGATIVE 10/01/2021 0932   Sepsis Labs: @LABRCNTIP (procalcitonin:4,lacticidven:4)  ) Recent Results (from the past 240 hour(s))  Resp Panel by RT-PCR (Flu A&B, Covid) Nasopharyngeal Swab     Status: None   Collection Time: 01/17/22 11:14 AM   Specimen: Nasopharyngeal Swab; Nasopharyngeal(NP) swabs in vial transport medium  Result Value Ref Range Status   SARS Coronavirus 2 by RT PCR NEGATIVE NEGATIVE Final    Comment: (NOTE) SARS-CoV-2 target nucleic acids are NOT DETECTED.  The SARS-CoV-2 RNA is generally detectable in upper respiratory specimens during the acute phase of infection. The lowest concentration of SARS-CoV-2 viral copies this assay can detect is 138 copies/mL. A negative result does not preclude SARS-Cov-2 infection and should not be used as the sole basis for treatment or other patient management decisions. A negative result may occur with  improper specimen collection/handling, submission of specimen other than nasopharyngeal swab, presence of viral mutation(s) within the areas targeted by this assay, and inadequate number of viral copies(<138 copies/mL). A negative result must be combined with clinical observations, patient history, and epidemiological information. The expected result is Negative.  Fact Sheet for Patients:  EntrepreneurPulse.com.au  Fact Sheet for Healthcare Providers:  IncredibleEmployment.be  This test is no t yet approved or cleared by the [place] FDA and  has been authorized for detection and/or diagnosis of SARS-CoV-2 by FDA under an Emergency Use Authorization (EUA). This EUA will remain  in effect (meaning this test can be used) for the duration of the COVID-19  declaration under Section 564(b)(1) of the Act, 21 U.S.C.section 360bbb-3(b)(1), unless the authorization is terminated  or revoked sooner.       Influenza A by PCR NEGATIVE NEGATIVE Final   Influenza B by PCR NEGATIVE NEGATIVE Final    Comment: (NOTE) The Xpert Xpress SARS-CoV-2/FLU/RSV plus assay is intended as an aid in the diagnosis of influenza from Nasopharyngeal swab specimens and should not be used as a sole basis for treatment. Nasal washings and aspirates are unacceptable for Xpert Xpress SARS-CoV-2/FLU/RSV testing.  Fact Sheet for Patients: EntrepreneurPulse.com.au  Fact Sheet for Healthcare Providers: IncredibleEmployment.be  This test is not yet approved or cleared by the [place] FDA and has been authorized for detection and/or diagnosis of SARS-CoV-2 by FDA under an Emergency Use Authorization (EUA). This EUA will remain in effect (meaning this test can be used) for the duration of the COVID-19 declaration under Section 564(b)(1) of the Act, 21 U.S.C. section 360bbb-3(b)(1), unless the authorization is terminated or revoked.  Performed at [facility], [place] [address] [place], [place] [address]       Studies: No results found.  Scheduled Meds:  docusate sodium  100 mg Oral BID    Continuous Infusions:  methocarbamol (ROBAXIN) IV       LOS: 0 days     Kayleen Memos, MD [facility] Pager [phone]  If 7PM-7AM, please contact night-coverage [email or web address] Password [facility] 01/18/2022, 11:23 AM

## 2022-01-18 NOTE — Evaluation (Signed)
Physical Therapy Evaluation Patient Details Name: Julia Kerr MRN: [id number] DOB: 01-Dec-1923 Today's Date: 01/18/2022  History of Present Illness  Pt is a 88 y/o F presenting to ED on 1/29 after falling out of her chair landing on L hip. CT showing multiple acute fractures of the left hemipelvis including the left superior and inferior pubic rami as well as the sacrum and  ilium adjacent to the inferior margin of the left SI joint. PMH includes prior shoulder surgery and CKD.   Clinical Impression  Pt admitted with above diagnosis. Pt currently with functional limitations due to the deficits listed below (see PT Problem List). At the time of PT eval pt was able to perform transfers and minimal ambulation (2') with up to mod assist and RW for safety. Pain limiting session however anticipate pt will progress well as pain is better tolerated. Son present and goals discussed. He is hopeful for pt to return home with family support after a short stay at SNF level rehab. Acutely, pt will benefit from skilled PT to increase their independence and safety with mobility to allow discharge to the venue listed below.          Recommendations for follow up therapy are one component of a multi-disciplinary discharge planning process, led by the attending physician.  Recommendations may be updated based on patient status, additional functional criteria and insurance authorization.  Follow Up Recommendations Skilled nursing-short term rehab (<3 hours/day)    Assistance Recommended at Discharge Frequent or constant Supervision/Assistance  Patient can return home with the following  A lot of help with walking and/or transfers;A lot of help with bathing/dressing/bathroom;Assist for transportation;Help with stairs or ramp for entrance    Equipment Recommendations Rolling walker (2 wheels)  Recommendations for Other Services       Functional Status Assessment Patient has had a recent decline in their  functional status and demonstrates the ability to make significant improvements in function in a reasonable and predictable amount of time.     Precautions / Restrictions Precautions Precautions: Fall Restrictions Weight Bearing Restrictions: Yes RLE Weight Bearing: Weight bearing as tolerated LLE Weight Bearing: Weight bearing as tolerated      Mobility  Bed Mobility Overal bed mobility: Needs Assistance Bed Mobility: Supine to Sit, Sit to Supine     Supine to sit: Mod assist Sit to supine: Mod assist   General bed mobility comments: VC's for sequencing and general safety. Pt first attempting to roll onto the L hip but with increased pain. VC's and assist for achieving long sitting first and then advancing LE's around off EOB. This technique was less pain for the patient.    Transfers Overall transfer level: Needs assistance Equipment used: Rolling walker (2 wheels) Transfers: Sit to/from Stand Sit to Stand: Min assist           General transfer comment: VC's for hand placement on seated surface for safety. Increased time but pt was able to power up to full stand. Malalignment of LE's in standing due to weakness/pain and difficulty correcting.    Ambulation/Gait Ambulation/Gait assistance: Mod assist Gait Distance (Feet): 2 Feet Assistive device: Rolling walker (2 wheels) Gait Pattern/deviations: Step-to pattern, Decreased stride length, Trunk flexed, Narrow base of support Gait velocity: Decreased Gait velocity interpretation: <1.31 ft/sec, indicative of household ambulator   General Gait Details: Pt took 3 very small steps forward and then backwards at EOB. Once sitting, pt laterally scooted up towards [place].  Stairs  Wheelchair Mobility    Modified Rankin (Stroke Patients Only)       Balance Overall balance assessment: Needs assistance Sitting-balance support: Bilateral upper extremity supported, Feet unsupported Sitting balance-Leahy Scale:  Good     Standing balance support: Bilateral upper extremity supported, During functional activity, Reliant on assistive device for balance Standing balance-Leahy Scale: Poor                               Pertinent Vitals/Pain Pain Assessment Pain Assessment: Faces Faces Pain Scale: Hurts even more Pain Location: L hip Pain Descriptors / Indicators: Constant, Discomfort, Guarding Pain Intervention(s): Limited activity within patient's tolerance, Monitored during session, Repositioned    Home Living Family/patient expects to be discharged to:: Private residence Living Arrangements: Children;Other relatives Available Help at Discharge: Family;Available 24 hours/day Type of Home: House Home Access: Stairs to enter;Ramped entrance       Home Layout: One level Home Equipment: Shower seat;Wheelchair - manual;Cane - single point Additional Comments: Pt lives in a 550 sq foot house on her son's property where they are available almost all the time.    Prior Function Prior Level of Function : Independent/Modified Independent             Mobility Comments: With her SPC, pt was independent ADLs Comments: Pt cooking, cleaning, ADL's independently. Son took her to the store, she was able to walk around and get everything she needed. Does not drive.     Hand Dominance   Dominant Hand: Right    Extremity/Trunk Assessment   Upper Extremity Assessment Upper Extremity Assessment: Defer to OT evaluation    Lower Extremity Assessment Lower Extremity Assessment: LLE deficits/detail LLE Deficits / Details: Increased pain with movement in the LLE due to location of pelvic fractures. Pt was able to elevate BLE's off bed without assist.    Cervical / Trunk Assessment Cervical / Trunk Assessment: Kyphotic  Communication   Communication: Prefers language other than [place];Other (comment);Interpreter utilized (Son very supportive and helpful during session. Prefers to  interpret if he is present.)  Cognition Arousal/Alertness: Awake/alert Behavior During Therapy: WFL for tasks assessed/performed Overall Cognitive Status: Within Functional Limits for tasks assessed                                          General Comments General comments (skin integrity, edema, etc.): son present in room interpreting during session    Exercises     Assessment/Plan    PT Assessment Patient needs continued PT services  PT Problem List Decreased strength;Decreased range of motion;Decreased activity tolerance;Decreased balance;Decreased mobility;Decreased knowledge of use of DME;Decreased safety awareness;Decreased knowledge of precautions;Pain       PT Treatment Interventions DME instruction;Gait training;Stair training;Functional mobility training;Therapeutic activities;Therapeutic exercise;Balance training;Patient/family education    PT Goals (Current goals can be found in the Care Plan section)  Acute Rehab PT Goals Patient Stated Goal: Eventually home with family. Pt/family agreeable to SNF level rehab initially. PT Goal Formulation: With patient/family Time For Goal Achievement: 02/01/22 Potential to Achieve Goals: Good    Frequency Min 3X/week     Co-evaluation               AM-PAC PT "6 Clicks" Mobility  Outcome Measure Help needed turning from your back to your side while in a flat bed without using bedrails?: A  Little Help needed moving from lying on your back to sitting on the side of a flat bed without using bedrails?: A Lot Help needed moving to and from a bed to a chair (including a wheelchair)?: A Lot Help needed standing up from a chair using your arms (e.g., wheelchair or bedside chair)?: A Little Help needed to walk in hospital room?: Total Help needed climbing 3-5 steps with a railing? : Total 6 Click Score: 12    End of Session Equipment Utilized During Treatment: Gait belt Activity Tolerance: Patient tolerated  treatment well Patient left: in bed;with call bell/phone within reach;with bed alarm set;with family/visitor present Nurse Communication: Mobility status (Pt needs purewick) PT Visit Diagnosis: Unsteadiness on feet (R26.81);Pain;Difficulty in walking, not elsewhere classified (R26.2) Pain - Right/Left: Left Pain - part of body: Hip;Leg (pelvis)    Time: 4492-0100 PT Time Calculation (min) (ACUTE ONLY): 40 min   Charges:   PT Evaluation $PT Eval Moderate Complexity: 1 Mod PT Treatments $Gait Training: 23-37 mins        Rolinda Roan, PT, DPT Acute Rehabilitation Services Pager: [phone] Office: [phone]   Thelma Comp 01/18/2022, 12:52 PM

## 2022-01-18 NOTE — Progress Notes (Signed)
Mobility Specialist Criteria Algorithm Info. ° ° 01/18/22 1400  °Mobility  °Activity Stood at bedside °(LE exercises)  °Range of Motion/Exercises Active;All extremities  °Level of Assistance Moderate assist, patient does 50-74%  °Assistive Device Front wheel walker  °RLE Weight Bearing WBAT  °LLE Weight Bearing WBAT  °Activity Response Tolerated well  ° °Patient received in bed attempting to remove telemetry leads. Writer redirected pt, pt agreed to participate in mobility. Pt required min A to EOB from supine>sit. Demo sit>stand x3 mod A + cues for hand placement. LOB x1 requiring min A to balance. Tolerated well without complaint, was left in bed with all needs met. Call bell in reach.  ° °01/18/2022 °4:27 PM ° ° , CMS, BS EXP °Acute Rehabilitation Services  °Phone:[phone] °Office: [phone] ° °

## 2022-01-18 NOTE — Evaluation (Signed)
Occupational Therapy Evaluation Patient Details Name: Julia Kerr MRN: [id number] DOB: 1922-12-23 Today's Date: 01/18/2022   History of Present Illness Pt is a 88 y/o F presenting to ED on 1/29 after falling out of her chair landing on L hip. CT showing L pelvic fracture. PMH includes anemia and CKD.   Clinical Impression   Pt is independent at baseline with ADLs/IADLs, uses cane for mobility. Per son who is in the room to interpret, pt lives in small house on son's property, family is available for assistance at d/c. Pt currently min-max A for ADLs, mod A for  bed mobility and min A for transfers. Pt limited by increased pain this session at L hip. Pt presenting with impairments listed below, will follow acutely. Recommend d/c to SNF.     Recommendations for follow up therapy are one component of a multi-disciplinary discharge planning process, led by the attending physician.  Recommendations may be updated based on patient status, additional functional criteria and insurance authorization.   Follow Up Recommendations  Skilled nursing-short term rehab (<3 hours/day)    Assistance Recommended at Discharge Intermittent Supervision/Assistance  Patient can return home with the following A lot of help with walking and/or transfers;A lot of help with bathing/dressing/bathroom;Assistance with cooking/housework;Assist for transportation;Help with stairs or ramp for entrance    Functional Status Assessment  Patient has had a recent decline in their functional status and demonstrates the ability to make significant improvements in function in a reasonable and predictable amount of time.  Equipment Recommendations  BSC/3in1    Recommendations for Other Services PT consult     Precautions / Restrictions Precautions Precautions: Fall Restrictions Weight Bearing Restrictions: Yes RLE Weight Bearing: Weight bearing as tolerated LLE Weight Bearing: Weight bearing as tolerated      Mobility  Bed Mobility Overal bed mobility: Needs Assistance Bed Mobility: Supine to Sit, Sit to Supine     Supine to sit: Mod assist Sit to supine: Mod assist        Transfers Overall transfer level: Needs assistance Equipment used: Rolling walker (2 wheels) Transfers: Sit to/from Stand Sit to Stand: Min assist                  Balance Overall balance assessment: Needs assistance Sitting-balance support: Bilateral upper extremity supported, Feet unsupported Sitting balance-Leahy Scale: Good     Standing balance support: Bilateral upper extremity supported, During functional activity, Reliant on assistive device for balance Standing balance-Leahy Scale: Poor                             ADL either performed or assessed with clinical judgement   ADL Overall ADL's : Needs assistance/impaired Eating/Feeding: Set up;Sitting   Grooming: Minimal assistance;Sitting   Upper Body Bathing: Minimal assistance;Sitting   Lower Body Bathing: Maximal assistance;Sitting/lateral leans   Upper Body Dressing : Minimal assistance;Sitting   Lower Body Dressing: Maximal assistance;Sitting/lateral leans   Toilet Transfer: Minimal assistance;Moderate assistance;Rolling walker (2 wheels);Stand-pivot;BSC/3in1   Toileting- Water quality scientist and Hygiene: Maximal assistance;Sit to/from stand Toileting - Clothing Manipulation Details (indicate cue type and reason): to doff brief in standing     Functional mobility during ADLs: Minimal assistance       Vision   Vision Assessment?: No apparent visual deficits     Perception     Praxis      Pertinent Vitals/Pain Pain Assessment Pain Assessment: Faces Pain Score: 6  Faces Pain Scale: Hurts even more  Pain Location: L hip Pain Descriptors / Indicators: Constant, Discomfort, Guarding Pain Intervention(s): Limited activity within patient's tolerance, Monitored during session, RN gave pain meds during session     Hand  Dominance Right   Extremity/Trunk Assessment Upper Extremity Assessment Upper Extremity Assessment: Generalized weakness   Lower Extremity Assessment Lower Extremity Assessment: Defer to PT evaluation   Cervical / Trunk Assessment Cervical / Trunk Assessment: Kyphotic   Communication Communication Communication: Prefers language other than [place];Other (comment);Interpreter utilized (son able to interpret during session)   Cognition Arousal/Alertness: Awake/alert Behavior During Therapy: WFL for tasks assessed/performed Overall Cognitive Status: Within Functional Limits for tasks assessed                                       General Comments  son present in room interpreting during session    Exercises     Shoulder Clark expects to be discharged to:: Private residence Living Arrangements: Children;Other relatives Available Help at Discharge: Family;Available 24 hours/day Type of Home: House Home Access: Stairs to enter;Ramped entrance     Home Layout: One level     Bathroom Shower/Tub: Occupational psychologist: Standard     Home Equipment: Shower seat;Wheelchair - manual;Cane - single point   Additional Comments: Pt lives in a 550 sq feet house on her son's property where they are available almost all the time.      Prior Functioning/Environment Prior Level of Function : Independent/Modified Independent             Mobility Comments: With her SPC, pt was independent ADLs Comments: Pt cooking, cleaning, ADL's independently. Son took her to the store, she was able to walk around and get everything she needed. Does not drive.        OT Problem List: Decreased strength;Decreased range of motion;Decreased activity tolerance;Impaired balance (sitting and/or standing);Decreased safety awareness      OT Treatment/Interventions: Self-care/ADL training;Therapeutic exercise;DME and/or AE  instruction;Therapeutic activities;Patient/family education;Balance training    OT Goals(Current goals can be found in the care plan section) Acute Rehab OT Goals Patient Stated Goal: none stated OT Goal Formulation: With patient/family Time For Goal Achievement: 02/01/22 Potential to Achieve Goals: Good ADL Goals Pt Will Perform Lower Body Dressing: with mod assist;sit to/from stand;sitting/lateral leans Pt Will Transfer to Toilet: with min guard assist;regular height toilet;bedside commode;ambulating Additional ADL Goal #1: Pt will perform bed mobility with min A in prep for transfers and seated ADLs.  OT Frequency: Min 2X/week    Co-evaluation              AM-PAC OT "6 Clicks" Daily Activity     Outcome Measure Help from another person eating meals?: None Help from another person taking care of personal grooming?: A Little Help from another person toileting, which includes using toliet, bedpan, or urinal?: A Lot Help from another person bathing (including washing, rinsing, drying)?: A Lot Help from another person to put on and taking off regular upper body clothing?: A Little Help from another person to put on and taking off regular lower body clothing?: Total 6 Click Score: 15   End of Session Equipment Utilized During Treatment: Gait belt;Rolling walker (2 wheels) Nurse Communication: Mobility status;Other (comment) (need for new purewick)  Activity Tolerance: Patient limited by pain Patient left: in bed;with call bell/phone within reach;with bed alarm set;with family/visitor present  OT Visit Diagnosis: Unsteadiness on feet (R26.81);Other abnormalities of gait and mobility (R26.89);Muscle weakness (generalized) (M62.81);History of falling (Z91.81);Pain Pain - Right/Left: Left Pain - part of body: Hip                Time: 1035-1050 OT Time Calculation (min): 15 min Charges:  OT General Charges $OT Visit: 1 Visit OT Evaluation $OT Eval Moderate Complexity: 1  [address] OTD, OTR/L Acute Rehab [phone] - [place] 01/18/2022, 11:20 AM

## 2022-01-18 NOTE — Plan of Care (Signed)

## 2022-01-18 NOTE — Progress Notes (Signed)
Initial Nutrition Assessment  DOCUMENTATION CODES:   Severe malnutrition in context of chronic illness  INTERVENTION:  -Ensure Enlive po BID, each supplement provides 350 kcal and 20 grams of protein -MVI with minerals daily  NUTRITION DIAGNOSIS:   Severe Malnutrition related to chronic illness as evidenced by severe muscle depletion, severe fat depletion.  GOAL:   Patient will meet greater than or equal to 90% of their needs  MONITOR:   PO intake, Supplement acceptance, Labs, Weight trends, I & O's  REASON FOR ASSESSMENT:   Consult Hip fracture protocol  ASSESSMENT:   Pt with PMH significant for anemia associated with CKD stg 3b presenting with a mechanical fall, fell off a narrow bench when it shifted under her.   Work-up revealed multiple acute pelvic fractures, small anterior pelvic hematoma, on CT left hip without contrast 01/17/2022.  Seen by orthopedic surgery, recommended conservative management, no plan for surgical intervention.  Pt sleeping at time of RD visit and did not wake to RD voice/touch. Modified nutrition-focused physical exam performed. Weight history reviewed. Pt's weight appears to have been stable; however, pt noted to have severe muscle and fat depletions upon examination. RD to order oral nutrition supplements given pt with increased nutrient needs 2/2 malnutrition and healing.   PO Intake: none documented   Medications: senna-docusate Labs: Recent Labs  Lab 01/17/22 0800 01/18/22 0702  NA 138 141  K 4.3 4.7  CL 109 109  CO2 18* 23  BUN 42* 44*  CREATININE 1.61* 1.85*  CALCIUM 8.5* 8.0*  GLUCOSE 142* 84   NUTRITION - FOCUSED PHYSICAL EXAM:  Flowsheet Row Most Recent Value  Orbital Region Mild depletion  Upper Arm Region Severe depletion  Thoracic and Lumbar Region Severe depletion  Buccal Region Severe depletion  Temple Region Severe depletion  Clavicle Bone Region Severe depletion  Clavicle and Acromion Bone Region Severe depletion   Scapular Bone Region Severe depletion  Dorsal Hand Severe depletion  Patellar Region Severe depletion  Anterior Thigh Region Severe depletion  Posterior Calf Region Severe depletion  Edema (RD Assessment) None  Hair Reviewed  Eyes Reviewed  Mouth Reviewed  Skin Reviewed  Nails Reviewed       Diet Order:   Diet Order             Diet regular Room service appropriate? Yes; Fluid consistency: Thin  Diet effective now                   EDUCATION NEEDS:   No education needs have been identified at this time  Skin:  Skin Assessment: Reviewed RN Assessment  Last BM:  1/27  Height:   Ht Readings from Last 1 Encounters:  01/17/22 4\' 8"  (1.422 m)    Weight:   Wt Readings from Last 1 Encounters:  01/17/22 38.2 kg    BMI:  Body mass index is 18.88 kg/m.  Estimated Nutritional Needs:   Kcal:  1300-1500  Protein:  65-75 grams  Fluid:  >1.3L     Theone Stanley., MS, RD, LDN (she/her/hers) RD pager number and weekend/on-call pager number located in [place].

## 2022-01-18 NOTE — TOC Initial Note (Addendum)
Transition of Care [facility]) - Initial/Assessment Note    Patient Details  Name: Julia Kerr MRN: [id number] Date of Birth: August 29, 1923  Transition of Care [facility]) CM/SW Contact:    Joanne Chars, LCSW Phone Number: 01/18/2022, 3:39 PM  Clinical Narrative:   CSW spoke with son Roselyn Reef by phone who confirms desire for SNF.  Choice document left in room.  Pt lives alone but son is next door.  No services.  Permission given by son to sent referral out in hub.  Pt has one covid vaccine shot.           At son's request, email sent to [employer] [facility] requesting screening for medicaid eligibility.         Expected Discharge Plan: Skilled Nursing Facility Barriers to Discharge: Continued Medical Work up, SNF Pending bed offer   Patient Goals and CMS Choice   CMS Medicare.gov Compare Post Acute Care list provided to:: Patient Represenative (must comment) Choice offered to / list presented to : Adult Children  Expected Discharge Plan and Services Expected Discharge Plan: [place] In-house Referral: Clinical Social Work   Post Acute Care Choice: [place] Living arrangements for the past 2 months: [place]                                      Prior Living Arrangements/Services Living arrangements for the past 2 months: Single Family Home Lives with:: Self (son lives next door) Patient language and need for interpreter reviewed:: Yes        Need for Family Participation in Patient Care: Yes (Comment) Care giver support system in place?: Yes (comment) Current home services: Other (comment) (none) Criminal Activity/Legal Involvement Pertinent to Current Situation/Hospitalization: No - Comment as needed  Activities of Daily Living Home Assistive Devices/Equipment: Cane (specify quad or straight), Dentures (specify type), Other (Comment) (denture upper) ADL Screening (condition at time of admission) Patient's cognitive  ability adequate to safely complete daily activities?: Yes Is the patient deaf or have difficulty hearing?: No Does the patient have difficulty seeing, even when wearing glasses/contacts?: No Does the patient have difficulty concentrating, remembering, or making decisions?: No Patient able to express need for assistance with ADLs?: Yes Does the patient have difficulty dressing or bathing?: No Independently performs ADLs?: Yes (appropriate for developmental age) (patient's baseline is independent) Does the patient have difficulty walking or climbing stairs?: Yes Weakness of Legs: None Weakness of Arms/Hands: None  Permission Sought/Granted                  Emotional Assessment Appearance:: Appears stated age Attitude/Demeanor/Rapport: Unable to Assess Affect (typically observed): Unable to Assess Orientation: :  (not recorded) Alcohol / Substance Use: Not Applicable Psych Involvement: No (comment)  Admission diagnosis:  Multiple closed pelvic fractures without disruption of pelvic circle (HCC) [S32.82XA] Left acetabular fracture (HCC) [S32.402A] Closed fracture of multiple pubic rami, left, initial encounter ([place]) [S32.592A] Anemia, unspecified type [D64.9] Chronic kidney disease, unspecified CKD stage [N18.9] Multiple closed pelvic fractures without disruption of pelvic ring ([place]) [S32.82XA] Patient Active Problem List   Diagnosis Date Noted   Multiple closed pelvic fractures without disruption of pelvic ring ([place]) 01/18/2022   Multiple closed pelvic fractures without disruption of pelvic circle ([place]) 01/17/2022   Anemia associated with chronic renal failure 01/17/2022   Seasonal allergic rhinitis 04/12/2021   Left sided sciatica 04/12/2021   Chronic  rhinitis 02/06/2020   Adverse food reaction 02/06/2020   Vaccine counseling 02/06/2020   Cellulitis of lower extremity 08/18/2019   Insect bite 08/01/2019   Low serum thyroid stimulating hormone (TSH) 08/17/2018   Normocytic  normochromic anemia 08/17/2018   CKD (chronic kidney disease), stage III (HCC) 08/17/2018   Dehydration, moderate 08/17/2018   Low HDL (under 40) 08/17/2018   Vitamin D deficiency 08/04/2018   B12 deficiency-requiring injections in past 08/04/2018   Bruit of right carotid artery 08/04/2018   Choledocholithiasis with chronic cholecystitis- not amenable to surgery due to age 86/09/3012   Biliary colic symptom- intolerant to dairy and fats due to gallstone presence 08/04/2018   Environmental and seasonal allergies 08/04/2018   Low weight for height- bmi 17.5 08/04/2018   Postmenopausal bone loss 08/04/2018   Chronic fatigue- with B12 def 08/04/2018   PCP:  [name], PA-C Pharmacy:   [id number] [address], [place] [address] Phone: [phone] Fax: [phone]     Social Determinants of Health (SDOH) Interventions    Readmission Risk Interventions No flowsheet data found.

## 2022-01-18 NOTE — TOC CAGE-AID Note (Signed)
Transition of Care [facility]) - CAGE-AID Screening   Patient Details  Name: Julia Kerr MRN: [id number] Date of Birth: 08/31/1923  Transition of Care [facility]) CM/SW Contact:    Smith Mcnicholas C Tarpley-Carter, [place] Phone Number: 01/18/2022, 11:29 AM   Clinical Narrative: Pt is unable to participate in Cage Aid.  Pt is not appropriate for assessment.  Adalea Handler Tarpley-Carter, MSW, LCSW-A Pronouns:  She/Her/Hers  Transitions of Care Clinical Social Worker Direct Number:  [phone] [email or web address]  CAGE-AID Screening: Substance Abuse Screening unable to be completed due to: : Patient unable to participate (Pt is not appropriate for assessment.)             Substance Abuse Education Offered: No

## 2022-01-19 DIAGNOSIS — R6889 Other general symptoms and signs: Secondary | ICD-10-CM | POA: Diagnosis not present

## 2022-01-19 DIAGNOSIS — E538 Deficiency of other specified B group vitamins: Secondary | ICD-10-CM | POA: Diagnosis not present

## 2022-01-19 DIAGNOSIS — E860 Dehydration: Secondary | ICD-10-CM | POA: Diagnosis not present

## 2022-01-19 DIAGNOSIS — E43 Unspecified severe protein-calorie malnutrition: Secondary | ICD-10-CM | POA: Diagnosis not present

## 2022-01-19 DIAGNOSIS — J3089 Other allergic rhinitis: Secondary | ICD-10-CM | POA: Diagnosis not present

## 2022-01-19 DIAGNOSIS — Z7409 Other reduced mobility: Secondary | ICD-10-CM | POA: Diagnosis not present

## 2022-01-19 DIAGNOSIS — Z1159 Encounter for screening for other viral diseases: Secondary | ICD-10-CM | POA: Diagnosis not present

## 2022-01-19 DIAGNOSIS — N179 Acute kidney failure, unspecified: Secondary | ICD-10-CM | POA: Diagnosis not present

## 2022-01-19 DIAGNOSIS — N1832 Chronic kidney disease, stage 3b: Secondary | ICD-10-CM | POA: Diagnosis not present

## 2022-01-19 DIAGNOSIS — M545 Low back pain, unspecified: Secondary | ICD-10-CM | POA: Diagnosis not present

## 2022-01-19 DIAGNOSIS — J302 Other seasonal allergic rhinitis: Secondary | ICD-10-CM | POA: Diagnosis not present

## 2022-01-19 DIAGNOSIS — R509 Fever, unspecified: Secondary | ICD-10-CM | POA: Diagnosis not present

## 2022-01-19 DIAGNOSIS — K8044 Calculus of bile duct with chronic cholecystitis without obstruction: Secondary | ICD-10-CM | POA: Diagnosis not present

## 2022-01-19 DIAGNOSIS — W19XXXA Unspecified fall, initial encounter: Secondary | ICD-10-CM | POA: Diagnosis not present

## 2022-01-19 DIAGNOSIS — S3282XA Multiple fractures of pelvis without disruption of pelvic ring, initial encounter for closed fracture: Secondary | ICD-10-CM | POA: Diagnosis not present

## 2022-01-19 DIAGNOSIS — S90112A Contusion of left great toe without damage to nail, initial encounter: Secondary | ICD-10-CM | POA: Diagnosis not present

## 2022-01-19 DIAGNOSIS — Z743 Need for continuous supervision: Secondary | ICD-10-CM | POA: Diagnosis not present

## 2022-01-19 DIAGNOSIS — R262 Difficulty in walking, not elsewhere classified: Secondary | ICD-10-CM | POA: Diagnosis not present

## 2022-01-19 DIAGNOSIS — M6281 Muscle weakness (generalized): Secondary | ICD-10-CM | POA: Diagnosis not present

## 2022-01-19 DIAGNOSIS — J310 Chronic rhinitis: Secondary | ICD-10-CM | POA: Diagnosis not present

## 2022-01-19 DIAGNOSIS — D631 Anemia in chronic kidney disease: Secondary | ICD-10-CM | POA: Diagnosis not present

## 2022-01-19 DIAGNOSIS — M5432 Sciatica, left side: Secondary | ICD-10-CM | POA: Diagnosis not present

## 2022-01-19 DIAGNOSIS — M25552 Pain in left hip: Secondary | ICD-10-CM | POA: Diagnosis not present

## 2022-01-19 DIAGNOSIS — S300XXS Contusion of lower back and pelvis, sequela: Secondary | ICD-10-CM | POA: Diagnosis not present

## 2022-01-19 DIAGNOSIS — Z9181 History of falling: Secondary | ICD-10-CM | POA: Diagnosis not present

## 2022-01-19 DIAGNOSIS — M810 Age-related osteoporosis without current pathological fracture: Secondary | ICD-10-CM | POA: Diagnosis not present

## 2022-01-19 DIAGNOSIS — E559 Vitamin D deficiency, unspecified: Secondary | ICD-10-CM | POA: Diagnosis not present

## 2022-01-19 DIAGNOSIS — R5381 Other malaise: Secondary | ICD-10-CM | POA: Diagnosis not present

## 2022-01-19 DIAGNOSIS — S3282XD Multiple fractures of pelvis without disruption of pelvic ring, subsequent encounter for fracture with routine healing: Secondary | ICD-10-CM | POA: Diagnosis not present

## 2022-01-19 DIAGNOSIS — Z681 Body mass index (BMI) 19 or less, adult: Secondary | ICD-10-CM | POA: Diagnosis not present

## 2022-01-19 LAB — CBC
HCT: 24.2 % — ABNORMAL LOW (ref 36.0–46.0)
Hemoglobin: 7.4 g/dL — ABNORMAL LOW (ref 12.0–15.0)
MCH: 28.9 pg (ref 26.0–34.0)
MCHC: 30.6 g/dL (ref 30.0–36.0)
MCV: 94.5 fL (ref 80.0–100.0)
Platelets: 149 10*3/uL — ABNORMAL LOW (ref 150–400)
RBC: 2.56 MIL/uL — ABNORMAL LOW (ref 3.87–5.11)
RDW: 18.8 % — ABNORMAL HIGH (ref 11.5–15.5)
WBC: 12.2 10*3/uL — ABNORMAL HIGH (ref 4.0–10.5)
nRBC: 0.2 % (ref 0.0–0.2)

## 2022-01-19 LAB — BASIC METABOLIC PANEL
Anion gap: 13 (ref 5–15)
BUN: 43 mg/dL — ABNORMAL HIGH (ref 8–23)
CO2: 23 mmol/L (ref 22–32)
Calcium: 8.2 mg/dL — ABNORMAL LOW (ref 8.9–10.3)
Chloride: 104 mmol/L (ref 98–111)
Creatinine, Ser: 1.97 mg/dL — ABNORMAL HIGH (ref 0.44–1.00)
GFR, Estimated: 23 mL/min — ABNORMAL LOW (ref >60)
Glucose, Bld: 151 mg/dL — ABNORMAL HIGH (ref 70–99)
Potassium: 4.4 mmol/L (ref 3.5–5.1)
Sodium: 140 mmol/L (ref 135–145)

## 2022-01-19 LAB — MAGNESIUM: Magnesium: 1.6 mg/dL — ABNORMAL LOW (ref 1.7–2.4)

## 2022-01-19 LAB — PHOSPHORUS: Phosphorus: 4.9 mg/dL — ABNORMAL HIGH (ref 2.5–4.6)

## 2022-01-19 MED ORDER — SENNOSIDES-DOCUSATE SODIUM 8.6-50 MG PO TABS: 2.0000 | ORAL_TABLET | Freq: Every day | ORAL | 0 refills | Status: AC

## 2022-01-19 MED ORDER — DICLOFENAC SODIUM 1 % EX GEL: 4.0000 g | Freq: Four times a day (QID) | CUTANEOUS | 0 refills | Status: DC

## 2022-01-19 MED ORDER — ADULT MULTIVITAMIN W/MINERALS CH: 1.0000 | ORAL_TABLET | Freq: Every day | ORAL | 0 refills | Status: DC

## 2022-01-19 MED ORDER — METHOCARBAMOL 500 MG PO TABS: 500.0000 mg | ORAL_TABLET | Freq: Three times a day (TID) | ORAL | 0 refills | Status: AC | PRN

## 2022-01-19 MED ORDER — DICLOFENAC SODIUM 1 % EX GEL
4.0000 g | Freq: Four times a day (QID) | CUTANEOUS | Status: DC
  Filled 2022-01-19: qty 100

## 2022-01-19 MED ORDER — POLYETHYLENE GLYCOL 3350 17 G PO PACK: 17.0000 g | PACK | Freq: Every day | ORAL | 0 refills | Status: DC | PRN

## 2022-01-19 MED ORDER — OXYCODONE HCL 5 MG PO TABS: 5.0000 mg | ORAL_TABLET | Freq: Four times a day (QID) | ORAL | 0 refills | Status: AC | PRN

## 2022-01-19 MED ORDER — MAGNESIUM OXIDE -MG SUPPLEMENT 400 (240 MG) MG PO TABS
800.0000 mg | ORAL_TABLET | ORAL | Status: AC
  Administered 2022-01-19: 800 mg via ORAL
  Filled 2022-01-19: qty 2

## 2022-01-19 MED ORDER — ENSURE ENLIVE PO LIQD: 237.0000 mL | Freq: Two times a day (BID) | ORAL | 0 refills | Status: AC

## 2022-01-19 NOTE — TOC Progression Note (Addendum)
Transition of Care [facility]) - Progression Note    Patient Details  Name: Julia Kerr MRN: [id number] Date of Birth: 03-25-1923  Transition of Care [facility]) CM/SW Contact  Joanne Chars, LCSW Phone Number: 01/19/2022, 11:49 AM  Clinical Narrative:   CSW presented be offers to son, he requested that CSW follow up with [place], [place], [place].  Son then called back later and said he had spoken to rep from [facility] and that this is close to home and he would like to accept that bed offer.  [address] approved: [id number].  3 days: 1/31-2/02.        Expected Discharge Plan: [place] Barriers to Discharge: Continued Medical Work up, SNF Pending bed offer  Expected Discharge Plan and Services Expected Discharge Plan: [place] In-house Referral: Clinical Social Work   Post Acute Care Choice: [place] Living arrangements for the past 2 months: Single Family Home                                       Social Determinants of Health (SDOH) Interventions    Readmission Risk Interventions No flowsheet data found.

## 2022-01-19 NOTE — Progress Notes (Signed)
Pt keeps taking telemetry leads off; she refused to place it back on; night MD & CCMD made aware & put it on hold as of this writing;we'll try to put it back on dayshift.

## 2022-01-19 NOTE — Progress Notes (Signed)
Physical Therapy Treatment Patient Details Name: Julia Kerr MRN: [id number] DOB: [birth year] Today's Date: 01/19/2022   History of Present Illness Pt is a 88 y/o F presenting to ED on 1/29 after falling out of her chair landing on L hip. CT showing multiple acute fractures of the left hemipelvis including the left superior and inferior pubic rami as well as the sacrum and  ilium adjacent to the inferior margin of the left SI joint. PMH includes prior shoulder surgery and CKD.    PT Comments    Patient excited at start of session and eager to mobilize and improve strength. Pt required mod assist to move LE's to EOB and raise trunk upright. She stood 3x from EOB with mod assist and cues for hand placement and manual assist to reposition LE's for proper alignment. EOS pt c/o pain and declined to complete seated or supine exercises despite encouragement. She will continue to benefit from skilled PT interventions to address impairments and progress mobility as able.    Recommendations for follow up therapy are one component of a multi-disciplinary discharge planning process, led by the attending physician.  Recommendations may be updated based on patient status, additional functional criteria and insurance authorization.  Follow Up Recommendations  Skilled nursing-short term rehab (<3 hours/day)     Assistance Recommended at Discharge Frequent or constant Supervision/Assistance  Patient can return home with the following A lot of help with walking and/or transfers;A lot of help with bathing/dressing/bathroom;Assist for transportation;Help with stairs or ramp for entrance   Equipment Recommendations  Rolling walker (2 wheels)    Recommendations for Other Services       Precautions / Restrictions Precautions Precautions: Fall Restrictions Weight Bearing Restrictions: Yes RLE Weight Bearing: Weight bearing as tolerated LLE Weight Bearing: Weight bearing as tolerated     Mobility  Bed  Mobility Overal bed mobility: Needs Assistance Bed Mobility: Supine to Sit, Sit to Supine     Supine to sit: Mod assist, HOB elevated Sit to supine: Mod assist   General bed mobility comments: verbal/tactile cues to initiate reaching and rolling to move supine>sit. Pt required mod assist to rise trunk/head up to sit EOB and bed pad needed to scoot forward at EOB. Mod assist to complete bed mobility, pt with poor control to lower trunk and cues to use bed rail required. Assist to bring both LE's onto bed.    Transfers Overall transfer level: Needs assistance Equipment used: Rolling walker (2 wheels) Transfers: Sit to/from Stand Sit to Stand: Min assist           General transfer comment: verbal/tactile cues for hand placement on RW to power up. pt required Mod Assist for power up and completed 3x from EOB. pt placing hand on low bar on walker and required repositioning by therapist to obtain correct placement. LE's also with narrow BOS and assist to reposition for noram alignment required prior to each stand.    Ambulation/Gait Ambulation/Gait assistance: Mod assist   Assistive device: Rolling walker (2 wheels)   Gait velocity: decr     General Gait Details: pt took very limited small steps at EOB to Rt/Lt, pt with greater trouble initiating steps to Lt.   Stairs             Wheelchair Mobility    Modified Rankin (Stroke Patients Only)       Balance Overall balance assessment: Needs assistance Sitting-balance support: Bilateral upper extremity supported, Feet unsupported Sitting balance-Leahy Scale: Fair  Standing balance support: Reliant on assistive device for balance, Bilateral upper extremity supported Standing balance-Leahy Scale: Poor                              Cognition Arousal/Alertness: Awake/alert Behavior During Therapy: WFL for tasks assessed/performed Overall Cognitive Status: Within Functional Limits for tasks assessed                                           Exercises      General Comments        Pertinent Vitals/Pain Pain Assessment Pain Assessment: Faces Faces Pain Scale: Hurts even more Pain Location: L hip Pain Descriptors / Indicators: Constant, Discomfort, Guarding Pain Intervention(s): Limited activity within patient's tolerance, Monitored during session, Repositioned    Home Living                          Prior Function            PT Goals (current goals can now be found in the care plan section) Acute Rehab PT Goals Patient Stated Goal: Eventually home with family. Pt/family agreeable to SNF level rehab initially. PT Goal Formulation: With patient/family Time For Goal Achievement: 02/01/22 Potential to Achieve Goals: Good Progress towards PT goals: Progressing toward goals    Frequency    Min 3X/week      PT Plan Current plan remains appropriate    Co-evaluation              AM-PAC PT "6 Clicks" Mobility   Outcome Measure  Help needed turning from your back to your side while in a flat bed without using bedrails?: A Little Help needed moving from lying on your back to sitting on the side of a flat bed without using bedrails?: A Lot Help needed moving to and from a bed to a chair (including a wheelchair)?: A Lot Help needed standing up from a chair using your arms (e.g., wheelchair or bedside chair)?: A Lot Help needed to walk in hospital room?: Total Help needed climbing 3-5 steps with a railing? : Total 6 Click Score: 11    End of Session Equipment Utilized During Treatment: Gait belt Activity Tolerance: Patient tolerated treatment well;Patient limited by pain Patient left: in bed;with call bell/phone within reach;with bed alarm set Nurse Communication: Mobility status;Patient requests pain meds PT Visit Diagnosis: Unsteadiness on feet (R26.81);Pain;Difficulty in walking, not elsewhere classified (R26.2) Pain - Right/Left:  Left Pain - part of body: Hip;Leg     Time: 6644-0347 PT Time Calculation (min) (ACUTE ONLY): 30 min  Charges:  $Therapeutic Activity: 23-37 mins                     Verner Mould, DPT Acute Rehabilitation Services Office [phone] Pager [phone]    Jacques Navy 01/19/2022, 1:25 PM

## 2022-01-19 NOTE — Discharge Summary (Addendum)
Discharge Summary  Julia Kerr SLH:[id number] DOB: 04-07-1923  PCP: [name], PA-C  Admit date: 01/17/2022 Discharge date: 01/19/2022  Time spent: 35 minutes.  Recommendations for Outpatient Follow-up:  Follow-up with your PCP within a week.   Take your medications as prescribed.   Continue PT OT with assistance and fall precautions.  Discharge Diagnoses:  Active Hospital Problems   Diagnosis Date Noted   Multiple closed pelvic fractures without disruption of pelvic circle ([place]) 01/17/2022   Multiple closed pelvic fractures without disruption of pelvic ring ([place]) 01/18/2022   Anemia associated with chronic renal failure 01/17/2022   CKD (chronic kidney disease), stage III ([place]) 08/17/2018    Resolved Hospital Problems  No resolved problems to display.    Discharge Condition: Stable  Diet recommendation: Resume previous diet.  Vitals:   01/19/22 0414 01/19/22 0746  BP: 112/64 (!) 154/69  Pulse: 87 83  Resp: 17 17  Temp: 98.1 F (36.7 C) 98.1 F (36.7 C)  SpO2: 98% 100%    History of present illness:   Julia Kerr is a 88 y.o. female with medical history significant of anemia associated with stage 3b CKD presenting with a mechanical fall, fell off a narrow bench when it shifted under her.   Work-up revealed multiple acute pelvic fractures, small anterior pelvic hematoma, on CT left hip without contrast 01/17/2022.  Seen by orthopedic surgery, recommended conservative management, no plan for surgical intervention.  Seen by PT OT with recommendation for SNF.  TOC consulted to assist with SNF placement.   01/19/2022: Patient was seen and examined at bedside using interpreter via video chat.  States no pain when she does not move.  At baseline she uses a cane.  She was assessed by PT OT with recommendation for SNF.  Her son in the room is receptive to SNF placement.    Hospital Course:  Principal Problem:   Multiple closed pelvic fractures without disruption of  pelvic circle (HCC) Active Problems:   CKD (chronic kidney disease), stage III (HCC)   Anemia associated with chronic renal failure   Multiple closed pelvic fractures without disruption of pelvic ring (HCC)  Multiple acute pelvic fractures without disruption of pelvic circle (HCC)- (present on admission) -Generally healthy patient presenting with mechanical fall, multiple pelvis fractures Fell off a narrow bench when it shifted under her.  Seen by orthopedic surgery, recommended conservative management, no plan for surgical intervention.  Evaluated by PT OT with recommendation for SNF.  Orthopedic surgery recommends weightbearing as tolerated bilateral lower extremity. Continue as needed analgesics Follow-up with orthopedic surgery, Dr. Lyla Glassing, in 3 weeks.   Acute blood loss anemia in the setting of pelvic fractures, complicated by anemia of chronic disease associated with chronic renal failure- (present on admission) -Seen by oncology Dr. Lorenso Courier in 09/2021 -She was referred to nephrology and started on Epo injections Initially noted to have drop in hemoglobin 7.2K from baseline of 9K Hemoglobin uptrending 7.4 from 7.2. No overt bleeding. Follow-up with your PCP within 1 week, repeat CBC 01/25/2022.   AKI on CKD (chronic kidney disease), stage IIIB ([place])- (present on admission), prerenal secondary to dehydration. Creatinine uptrending 1.9 from 1.6 Continue to avoid nephrotoxic agents, dehydration and hypotension. Repeat BMP on 01/25/2022. Follow-up with your PCP within a week.   Leukocytosis, suspect reactive in the setting of multiple acute pelvic fractures. Presented with WBC of 13.3, WBC is downtrending Nonseptic appearing and afebrile  Hypomagnesemia Serum magnesium 1.6 Repleted orally with mag oxide 800 mg x 1  Hyperphosphatemia, multifactorial, in the setting of chronic kidney disease Phosphorus 4.9 Follow-up with your PCP     Advance Care Planning:   Code Status: Full  Code    Consults: Orthopedics; PT/OT; Nutrition; TOC team   Family Communication: Updated her son in person on 01/19/2022.           Code Status: Full code      Consultants: Orthopedic surgery   Procedures: None.   Antimicrobials: None.    Discharge Exam: BP (!) 154/69 (BP Location: Left Arm)    Pulse 83    Temp 98.1 F (36.7 C) (Oral)    Resp 17    Ht 4\' 8"  (1.422 m)    Wt 38.2 kg    SpO2 100%    BMI 18.88 kg/m  General: 88 y.o. year-old female frail-appearing in no acute distress.  Alert, pleasant, interactive. Cardiovascular: Regular rate and rhythm with no rubs or gallops.  No thyromegaly or JVD noted.   Respiratory: Clear to auscultation with no wheezes or rales.  Abdomen: Soft nontender nondistended with normal bowel sounds x4 quadrants. Musculoskeletal: No lower extremity edema bilaterally. Psychiatry: Mood is appropriate for condition and setting  Discharge Instructions You were cared for by a hospitalist during your hospital stay. If you have any questions about your discharge medications or the care you received while you were in the hospital after you are discharged, you can call the unit and asked to speak with the hospitalist on call if the hospitalist that took care of you is not available. Once you are discharged, your primary care physician will handle any further medical issues. Please note that NO REFILLS for any discharge medications will be authorized once you are discharged, as it is imperative that you return to your primary care physician (or establish a relationship with a primary care physician if you do not have one) for your aftercare needs so that they can reassess your need for medications and monitor your lab values.   Allergies as of 01/19/2022       Reactions   Food Other (See Comments)   Apples and strawberries cause burning of lips        Medication List     TAKE these medications    acetaminophen 500 MG tablet Commonly known as:  TYLENOL Take 500 mg by mouth every 6 (six) hours as needed for mild pain.   feeding supplement Liqd Take 237 mLs by mouth 2 (two) times daily between meals for 7 days.   methocarbamol 500 MG tablet Commonly known as: ROBAXIN Take 1 tablet (500 mg total) by mouth every 8 (eight) hours as needed for up to 3 days for muscle spasms.   multivitamin with minerals Tabs tablet Take 1 tablet by mouth daily. Start taking on: January 20, 2022   oxyCODONE 5 MG immediate release tablet Commonly known as: Oxy IR/ROXICODONE Take 1 tablet (5 mg total) by mouth every 6 (six) hours as needed for up to 3 days for breakthrough pain or severe pain ((for MODERATE breakthrough pain)).   polyethylene glycol 17 g packet Commonly known as: MIRALAX / GLYCOLAX Take 17 g by mouth daily as needed for mild constipation.   senna-docusate 8.6-50 MG tablet Commonly known as: Senokot-S Take 2 tablets by mouth at bedtime for 10 days.   VISINE DRY EYE RELIEF OP Place 1 drop into both eyes as needed (dry eyes).               Durable Medical Equipment  (  From admission, onward)           Start     Ordered   01/19/22 1337  For home use only DME Walker rolling  Once       Question Answer Comment  Walker: Other   Comments 2 wheels   Patient needs a walker to treat with the following condition Ambulatory dysfunction      01/19/22 1336            Allergies  Allergen Reactions   Food Other (See Comments)    Apples and strawberries cause burning of lips    Contact information for follow-up providers     [name], PA-C. Call today.   Specialty: Physician Assistant Why: Please call for a posthospital follow-up appointment. Contact information: [place] [address] [phone]         Rod Can, MD. Schedule an appointment as soon as possible for a visit in 2 week(s).   Specialty: Orthopedic Surgery Why: Please call for a posthospital follow-up  appointment. Contact information: [address] [place] [address] [phone]              Contact information for after-discharge care     [facility] .   Service: Skilled Nursing Contact information: [address] [place] [phone]                      The results of significant diagnostics from this hospitalization (including imaging, microbiology, ancillary and laboratory) are listed below for reference.    Significant Diagnostic Studies: CT HIP LEFT WO CONTRAST  Result Date: 01/17/2022 CLINICAL DATA:  Left hip pain.  Left pubic rami fractures EXAM: CT OF THE LEFT HIP WITHOUT CONTRAST TECHNIQUE: Multidetector CT imaging of the left hip was performed according to the standard protocol. Multiplanar CT image reconstructions were also generated. RADIATION DOSE REDUCTION: This exam was performed according to the departmental dose-optimization program which includes automated exposure control, adjustment of the mA and/or kV according to patient size and/or use of iterative reconstruction technique. COMPARISON:  Same-day x-ray FINDINGS: Bones/Joint/Cartilage Multiple acute fractures of the left hemipelvis. Comminuted moderately displaced fractures of the left superior pubic ramus and parasymphyseal left pubic bone. Mildly displaced left inferior pubic ramus fracture. Acute essentially nondisplaced fractures along the inferior margin of the left sacroiliac joint involving both the sacrum and ilium (series 6, images 57-66). Left SI joint is partially ankylosed along its superior margin. No left SI joint or pubic symphysis diastasis. Left proximal femur intact without fracture. Left hip joint intact without fracture or dislocation. No acetabular fracture. Bones are diffusely demineralized. Ligaments Suboptimally assessed by CT. Muscles and Tendons No acute musculotendinous abnormality by  CT. Soft tissues Small amount of ill-defined fluid along the left pelvic sidewall compatible with hematoma. Additional small anterior pelvic hematoma adjacent to the parasymphyseal fracture site. Extensive atherosclerotic vascular calcifications. Colonic diverticulosis. IMPRESSION: 1. Multiple acute fractures of the left hemipelvis including the left superior and inferior pubic rami as well as the sacrum and ilium adjacent to the inferior margin of the left SI joint. 2. No left SI joint or pubic symphysis diastasis. 3. Small amount of ill-defined left pelvic sidewall hematoma. Small anterior pelvic hematoma 4. Left hip intact without fracture or dislocation. Electronically Signed   By: Davina Poke D.O.   On: 01/17/2022 10:59   DG Knee Left Port  Result Date: 01/17/2022 CLINICAL DATA:  88 year old female with history of fall yesterday evening. Left knee pain. EXAM: PORTABLE LEFT KNEE - 1-2 VIEW COMPARISON:  No priors. FINDINGS: No evidence of fracture, dislocation, or joint effusion. No evidence of arthropathy or other focal bone abnormality. Numerous vascular calcifications. Soft tissues are unremarkable. IMPRESSION: 1. No acute radiographic abnormality of the left knee. 2. Atherosclerosis. Electronically Signed   By: Vinnie Langton M.D.   On: 01/17/2022 10:19   DG Hip Unilat With Pelvis 2-3 Views Left  Result Date: 01/17/2022 CLINICAL DATA:  Fall left hip pain, limited range of motion. EXAM: DG HIP (WITH OR WITHOUT PELVIS) 2-3V LEFT COMPARISON:  None. FINDINGS: Diffuse demineralization of bone. Comminuted displaced fractures of the left inferior and superior pubic rami. Probable nondisplaced left acetabular fracture. No visualized proximal femur fracture. Vascular calcifications. IMPRESSION: 1. Comminuted displaced fractures of the left inferior and superior pubic rami. 2. Probable nondisplaced left acetabular fracture Electronically Signed   By: Dahlia Bailiff M.D.   On: 01/17/2022 09:00     Microbiology: Recent Results (from the past 240 hour(s))  Resp Panel by RT-PCR (Flu A&B, Covid) Nasopharyngeal Swab     Status: None   Collection Time: 01/17/22 11:14 AM   Specimen: Nasopharyngeal Swab; Nasopharyngeal(NP) swabs in vial transport medium  Result Value Ref Range Status   SARS Coronavirus 2 by RT PCR NEGATIVE NEGATIVE Final    Comment: (NOTE) SARS-CoV-2 target nucleic acids are NOT DETECTED.  The SARS-CoV-2 RNA is generally detectable in upper respiratory specimens during the acute phase of infection. The lowest concentration of SARS-CoV-2 viral copies this assay can detect is 138 copies/mL. A negative result does not preclude SARS-Cov-2 infection and should not be used as the sole basis for treatment or other patient management decisions. A negative result may occur with  improper specimen collection/handling, submission of specimen other than nasopharyngeal swab, presence of viral mutation(s) within the areas targeted by this assay, and inadequate number of viral copies(<138 copies/mL). A negative result must be combined with clinical observations, patient history, and epidemiological information. The expected result is Negative.  Fact Sheet for Patients:  EntrepreneurPulse.com.au  Fact Sheet for Healthcare Providers:  IncredibleEmployment.be  This test is no t yet approved or cleared by the [place] FDA and  has been authorized for detection and/or diagnosis of SARS-CoV-2 by FDA under an Emergency Use Authorization (EUA). This EUA will remain  in effect (meaning this test can be used) for the duration of the COVID-19 declaration under Section 564(b)(1) of the Act, 21 U.S.C.section 360bbb-3(b)(1), unless the authorization is terminated  or revoked sooner.       Influenza A by PCR NEGATIVE NEGATIVE Final   Influenza B by PCR NEGATIVE NEGATIVE Final    Comment: (NOTE) The Xpert Xpress SARS-CoV-2/FLU/RSV plus assay is  intended as an aid in the diagnosis of influenza from Nasopharyngeal swab specimens and should not be used as a sole basis for treatment. Nasal washings and aspirates are unacceptable for Xpert Xpress SARS-CoV-2/FLU/RSV testing.  Fact Sheet for Patients: EntrepreneurPulse.com.au  Fact Sheet for Healthcare Providers: IncredibleEmployment.be  This test is not yet approved or cleared by the [place] FDA and has been authorized for detection and/or diagnosis of SARS-CoV-2 by FDA under an Emergency Use Authorization (EUA). This EUA will remain in effect (meaning this test can be used) for the duration of the COVID-19 declaration under Section 564(b)(1) of the Act, 21 U.S.C. section 360bbb-3(b)(1), unless the authorization is terminated or revoked.  Performed at [facility], [place] [address] [place], [place] [address]      Labs: Basic Metabolic Panel: Recent Labs  Lab 01/17/22 0800 01/18/22 0702 01/19/22 0631  NA 138 141 140  K 4.3 4.7 4.4  CL 109 109 104  CO2 18* 23 23  GLUCOSE 142* 84 151*  BUN 42* 44* 43*  CREATININE 1.61* 1.85* 1.97*  CALCIUM 8.5* 8.0* 8.2*  MG  --   --  1.6*  PHOS  --   --  4.9*   Liver Function Tests: No results for input(s): AST, ALT, ALKPHOS, BILITOT, PROT, ALBUMIN in the last 168 hours. No results for input(s): LIPASE, AMYLASE in the last 168 hours. No results for input(s): AMMONIA in the last 168 hours. CBC: Recent Labs  Lab 01/13/22 0908 01/17/22 0800 01/18/22 0702 01/18/22 1212 01/19/22 0631  WBC  --  13.3* 11.3*  --  12.2*  NEUTROABS  --  8.9*  --   --   --   HGB 8.6* 8.5* 7.2* 7.2* 7.4*  HCT  --  28.5* 24.8* 23.7* 24.2*  MCV  --  96.3 94.3  --  94.5  PLT  --  178 170  --  149*   Cardiac Enzymes: No results for input(s): CKTOTAL, CKMB, CKMBINDEX, TROPONINI in the last 168 hours. BNP: BNP (last 3 results) No results for input(s): BNP in the last 8760 hours.  ProBNP (last 3  results) No results for input(s): PROBNP in the last 8760 hours.  CBG: No results for input(s): GLUCAP in the last 168 hours.     Signed:  Kayleen Memos, MD [facility] 01/19/2022, 1:59 PM

## 2022-01-19 NOTE — TOC Transition Note (Signed)
Transition of Care [facility]) - CM/SW Discharge Note   Patient Details  Name: Marcee Jacobs MRN: [id number] Date of Birth: 11-24-1923  [facility] CM/SW Contact:  Joanne Chars, LCSW Phone Number: 01/19/2022, 2:04 PM   Clinical Narrative: Pt discharging to [facility], room 117.  RN call [phone] for report.  [facility] to transport, scheduled for 530 pickup.      Final next level of care: Skilled Nursing Facility Barriers to Discharge: Barriers Resolved   Patient Goals and CMS Choice   CMS Medicare.gov Compare Post Acute Care list provided to:: Patient Represenative (must comment) Choice offered to / list presented to : Adult Children  Discharge Placement              Patient chooses bed at:  [facility]) Patient to be transferred to facility by: [facility] Name of family member notified: son Roselyn Reef Patient and family notified of of transfer: 01/19/22  Discharge Plan and Services In-house Referral: Clinical Social Work   Post Acute Care Choice: [place]                               Social Determinants of Health (SDOH) Interventions     Readmission Risk Interventions No flowsheet data found.

## 2022-01-19 NOTE — Progress Notes (Signed)
Report given to [facility]

## 2022-01-21 DIAGNOSIS — S90112A Contusion of left great toe without damage to nail, initial encounter: Secondary | ICD-10-CM | POA: Diagnosis not present

## 2022-01-25 ENCOUNTER — Encounter (HOSPITAL_COMMUNITY): Payer: Self-pay

## 2022-01-25 DIAGNOSIS — S90112A Contusion of left great toe without damage to nail, initial encounter: Secondary | ICD-10-CM | POA: Diagnosis not present

## 2022-01-26 DIAGNOSIS — R262 Difficulty in walking, not elsewhere classified: Secondary | ICD-10-CM | POA: Diagnosis not present

## 2022-01-26 DIAGNOSIS — M25552 Pain in left hip: Secondary | ICD-10-CM | POA: Diagnosis not present

## 2022-01-26 DIAGNOSIS — R5381 Other malaise: Secondary | ICD-10-CM | POA: Diagnosis not present

## 2022-01-27 ENCOUNTER — Encounter (HOSPITAL_COMMUNITY): Payer: Medicare Other

## 2022-01-27 DIAGNOSIS — S3282XD Multiple fractures of pelvis without disruption of pelvic ring, subsequent encounter for fracture with routine healing: Secondary | ICD-10-CM | POA: Diagnosis not present

## 2022-01-27 DIAGNOSIS — M545 Low back pain, unspecified: Secondary | ICD-10-CM | POA: Diagnosis not present

## 2022-01-27 DIAGNOSIS — Z7409 Other reduced mobility: Secondary | ICD-10-CM | POA: Diagnosis not present

## 2022-01-29 DIAGNOSIS — R5381 Other malaise: Secondary | ICD-10-CM | POA: Diagnosis not present

## 2022-01-29 DIAGNOSIS — R262 Difficulty in walking, not elsewhere classified: Secondary | ICD-10-CM | POA: Diagnosis not present

## 2022-01-29 DIAGNOSIS — M25552 Pain in left hip: Secondary | ICD-10-CM | POA: Diagnosis not present

## 2022-02-01 DIAGNOSIS — S90112A Contusion of left great toe without damage to nail, initial encounter: Secondary | ICD-10-CM | POA: Diagnosis not present

## 2022-02-05 DIAGNOSIS — M6281 Muscle weakness (generalized): Secondary | ICD-10-CM | POA: Diagnosis not present

## 2022-02-05 DIAGNOSIS — E43 Unspecified severe protein-calorie malnutrition: Secondary | ICD-10-CM | POA: Diagnosis not present

## 2022-02-05 DIAGNOSIS — N1832 Chronic kidney disease, stage 3b: Secondary | ICD-10-CM | POA: Diagnosis not present

## 2022-02-05 DIAGNOSIS — D631 Anemia in chronic kidney disease: Secondary | ICD-10-CM | POA: Diagnosis not present

## 2022-02-05 DIAGNOSIS — S3282XD Multiple fractures of pelvis without disruption of pelvic ring, subsequent encounter for fracture with routine healing: Secondary | ICD-10-CM | POA: Diagnosis not present

## 2022-02-05 DIAGNOSIS — M5432 Sciatica, left side: Secondary | ICD-10-CM | POA: Diagnosis not present

## 2022-02-05 DIAGNOSIS — Z9181 History of falling: Secondary | ICD-10-CM | POA: Diagnosis not present

## 2022-02-06 DIAGNOSIS — Z9181 History of falling: Secondary | ICD-10-CM | POA: Diagnosis not present

## 2022-02-06 DIAGNOSIS — E860 Dehydration: Secondary | ICD-10-CM | POA: Diagnosis not present

## 2022-02-06 DIAGNOSIS — N179 Acute kidney failure, unspecified: Secondary | ICD-10-CM | POA: Diagnosis not present

## 2022-02-06 DIAGNOSIS — E538 Deficiency of other specified B group vitamins: Secondary | ICD-10-CM | POA: Diagnosis not present

## 2022-02-06 DIAGNOSIS — D631 Anemia in chronic kidney disease: Secondary | ICD-10-CM | POA: Diagnosis not present

## 2022-02-06 DIAGNOSIS — E43 Unspecified severe protein-calorie malnutrition: Secondary | ICD-10-CM | POA: Diagnosis not present

## 2022-02-06 DIAGNOSIS — G8929 Other chronic pain: Secondary | ICD-10-CM | POA: Diagnosis not present

## 2022-02-06 DIAGNOSIS — E559 Vitamin D deficiency, unspecified: Secondary | ICD-10-CM | POA: Diagnosis not present

## 2022-02-06 DIAGNOSIS — J302 Other seasonal allergic rhinitis: Secondary | ICD-10-CM | POA: Diagnosis not present

## 2022-02-06 DIAGNOSIS — K8044 Calculus of bile duct with chronic cholecystitis without obstruction: Secondary | ICD-10-CM | POA: Diagnosis not present

## 2022-02-06 DIAGNOSIS — D62 Acute posthemorrhagic anemia: Secondary | ICD-10-CM | POA: Diagnosis not present

## 2022-02-06 DIAGNOSIS — J310 Chronic rhinitis: Secondary | ICD-10-CM | POA: Diagnosis not present

## 2022-02-06 DIAGNOSIS — M5432 Sciatica, left side: Secondary | ICD-10-CM | POA: Diagnosis not present

## 2022-02-06 DIAGNOSIS — N1832 Chronic kidney disease, stage 3b: Secondary | ICD-10-CM | POA: Diagnosis not present

## 2022-02-06 DIAGNOSIS — K5900 Constipation, unspecified: Secondary | ICD-10-CM | POA: Diagnosis not present

## 2022-02-06 DIAGNOSIS — M800AXD Age-related osteoporosis with current pathological fracture, other site, subsequent encounter for fracture with routine healing: Secondary | ICD-10-CM | POA: Diagnosis not present

## 2022-02-09 ENCOUNTER — Telehealth: Payer: Self-pay | Admitting: Physician Assistant

## 2022-02-09 DIAGNOSIS — E860 Dehydration: Secondary | ICD-10-CM | POA: Diagnosis not present

## 2022-02-09 DIAGNOSIS — D631 Anemia in chronic kidney disease: Secondary | ICD-10-CM | POA: Diagnosis not present

## 2022-02-09 DIAGNOSIS — M800AXD Age-related osteoporosis with current pathological fracture, other site, subsequent encounter for fracture with routine healing: Secondary | ICD-10-CM | POA: Diagnosis not present

## 2022-02-09 DIAGNOSIS — J302 Other seasonal allergic rhinitis: Secondary | ICD-10-CM | POA: Diagnosis not present

## 2022-02-09 DIAGNOSIS — E538 Deficiency of other specified B group vitamins: Secondary | ICD-10-CM | POA: Diagnosis not present

## 2022-02-09 DIAGNOSIS — E43 Unspecified severe protein-calorie malnutrition: Secondary | ICD-10-CM | POA: Diagnosis not present

## 2022-02-09 DIAGNOSIS — N179 Acute kidney failure, unspecified: Secondary | ICD-10-CM | POA: Diagnosis not present

## 2022-02-09 DIAGNOSIS — K5900 Constipation, unspecified: Secondary | ICD-10-CM | POA: Diagnosis not present

## 2022-02-09 DIAGNOSIS — Z9181 History of falling: Secondary | ICD-10-CM | POA: Diagnosis not present

## 2022-02-09 DIAGNOSIS — D62 Acute posthemorrhagic anemia: Secondary | ICD-10-CM | POA: Diagnosis not present

## 2022-02-09 DIAGNOSIS — G8929 Other chronic pain: Secondary | ICD-10-CM | POA: Diagnosis not present

## 2022-02-09 DIAGNOSIS — J310 Chronic rhinitis: Secondary | ICD-10-CM | POA: Diagnosis not present

## 2022-02-09 DIAGNOSIS — M5432 Sciatica, left side: Secondary | ICD-10-CM | POA: Diagnosis not present

## 2022-02-09 DIAGNOSIS — K8044 Calculus of bile duct with chronic cholecystitis without obstruction: Secondary | ICD-10-CM | POA: Diagnosis not present

## 2022-02-09 DIAGNOSIS — E559 Vitamin D deficiency, unspecified: Secondary | ICD-10-CM | POA: Diagnosis not present

## 2022-02-09 DIAGNOSIS — N1832 Chronic kidney disease, stage 3b: Secondary | ICD-10-CM | POA: Diagnosis not present

## 2022-02-09 NOTE — Progress Notes (Deleted)
Established Patient Office Visit  Subjective:  Patient ID: Julia Kerr, female    DOB: Apr 24, 1923  Age: 88 y.o. MRN: [id number]  CC: No chief complaint on file.   HPI [employer] presents for ***  Past Medical History:  Diagnosis Date   Anemia     Past Surgical History:  Procedure Laterality Date   CATARACT EXTRACTION, BILATERAL     SHOULDER SURGERY      No family history on file.  Social History   Socioeconomic History   Marital status: Widowed    Spouse name: Not on file   Number of children: Not on file   Years of education: Not on file   Highest education level: Not on file  Occupational History   Not on file  Tobacco Use   Smoking status: Never   Smokeless tobacco: Never  Vaping Use   Vaping Use: Never used  Substance and Sexual Activity   Alcohol use: Never   Drug use: Never   Sexual activity: Not Currently  Other Topics Concern   Not on file  Social History Narrative   Not on file   Social Determinants of Health   Financial Resource Strain: Not on file  Food Insecurity: Not on file  Transportation Needs: Not on file  Physical Activity: Not on file  Stress: Not on file  Social Connections: Not on file  Intimate Partner Violence: Not on file    Outpatient Medications Prior to Visit  Medication Sig Dispense Refill   acetaminophen (TYLENOL) 500 MG tablet Take 500 mg by mouth every 6 (six) hours as needed for mild pain.     Multiple Vitamin (MULTIVITAMIN WITH MINERALS) TABS tablet Take 1 tablet by mouth daily. 90 tablet 0   polyethylene glycol (MIRALAX / GLYCOLAX) 17 g packet Take 17 g by mouth daily as needed for mild constipation. 14 each 0   Polyethylene Glycol 400 (VISINE DRY EYE RELIEF OP) Place 1 drop into both eyes as needed (dry eyes).     No facility-administered medications prior to visit.    Allergies  Allergen Reactions   Food Other (See Comments)    Apples and strawberries cause burning of lips    ROS Review of Systems     Objective:    Physical Exam  There were no vitals taken for this visit. Wt Readings from Last 3 Encounters:  01/17/22 84 lb 3.5 oz (38.2 kg)  11/04/21 85 lb (38.6 kg)  10/01/21 84 lb (38.1 kg)     Health Maintenance Due  Topic Date Due   COVID-19 Vaccine (1) 02/14/1924   Pneumonia Vaccine 98+ Years old (1 - PCV) Never done   TETANUS/TDAP  Never done   Zoster Vaccines- Shingrix (1 of 2) Never done   DEXA SCAN  Never done   INFLUENZA VACCINE  Never done    There are no preventive care reminders to display for this patient.  Lab Results  Component Value Date   TSH 0.341 (L) 07/21/2020   Lab Results  Component Value Date   WBC 12.2 (H) 01/19/2022   HGB 7.4 (L) 01/19/2022   HCT 24.2 (L) 01/19/2022   MCV 94.5 01/19/2022   PLT 149 (L) 01/19/2022   Lab Results  Component Value Date   NA 140 01/19/2022   K 4.4 01/19/2022   CO2 23 01/19/2022   GLUCOSE 151 (H) 01/19/2022   BUN 43 (H) 01/19/2022   CREATININE 1.97 (H) 01/19/2022   BILITOT 0.9 10/01/2021   ALKPHOS 101  10/01/2021   AST 39 10/01/2021   ALT 27 10/01/2021   PROT 6.6 10/01/2021   ALBUMIN 3.9 10/01/2021   CALCIUM 8.2 (L) 01/19/2022   ANIONGAP 13 01/19/2022   EGFR 34 (L) 09/24/2021   GFR 40.98 (L) 12/03/2020   Lab Results  Component Value Date   CHOL 78 (L) 07/21/2020   Lab Results  Component Value Date   HDL 28 (L) 07/21/2020   Lab Results  Component Value Date   LDLCALC 33 07/21/2020   Lab Results  Component Value Date   TRIG 82 07/21/2020   Lab Results  Component Value Date   CHOLHDL 2.8 07/21/2020   Lab Results  Component Value Date   HGBA1C 4.7 (L) 07/21/2020      Assessment & Plan:   Problem List Items Addressed This Visit   None   No orders of the defined types were placed in this encounter.   Follow-up: No follow-ups on file.    Aron Baba, Carlyss

## 2022-02-09 NOTE — Telephone Encounter (Signed)
Spoke with the son, York Cerise, and he stated he would like to wait till the appointment on Thursday to discuss DME orders.

## 2022-02-09 NOTE — Telephone Encounter (Signed)
[employer] called and stated son has reached out to them regarding mother. She had a pelvic fracture on 01/29 and then was sent to a rehab facility however she has been released and now back in the home but is completely bed bound because she cannot walk. She has started PT and now requesting a wheel chair and bed rails. Can you please reach out to the son regarding this? Carleene Cooper [phone]

## 2022-02-11 ENCOUNTER — Ambulatory Visit: Payer: Medicare Other | Admitting: Physician Assistant

## 2022-02-11 DIAGNOSIS — M5432 Sciatica, left side: Secondary | ICD-10-CM | POA: Diagnosis not present

## 2022-02-11 DIAGNOSIS — Z9181 History of falling: Secondary | ICD-10-CM | POA: Diagnosis not present

## 2022-02-11 DIAGNOSIS — E860 Dehydration: Secondary | ICD-10-CM | POA: Diagnosis not present

## 2022-02-11 DIAGNOSIS — K5900 Constipation, unspecified: Secondary | ICD-10-CM | POA: Diagnosis not present

## 2022-02-11 DIAGNOSIS — N1832 Chronic kidney disease, stage 3b: Secondary | ICD-10-CM | POA: Diagnosis not present

## 2022-02-11 DIAGNOSIS — E559 Vitamin D deficiency, unspecified: Secondary | ICD-10-CM | POA: Diagnosis not present

## 2022-02-11 DIAGNOSIS — E538 Deficiency of other specified B group vitamins: Secondary | ICD-10-CM | POA: Diagnosis not present

## 2022-02-11 DIAGNOSIS — E43 Unspecified severe protein-calorie malnutrition: Secondary | ICD-10-CM | POA: Diagnosis not present

## 2022-02-11 DIAGNOSIS — K8044 Calculus of bile duct with chronic cholecystitis without obstruction: Secondary | ICD-10-CM | POA: Diagnosis not present

## 2022-02-11 DIAGNOSIS — M800AXD Age-related osteoporosis with current pathological fracture, other site, subsequent encounter for fracture with routine healing: Secondary | ICD-10-CM | POA: Diagnosis not present

## 2022-02-11 DIAGNOSIS — J302 Other seasonal allergic rhinitis: Secondary | ICD-10-CM | POA: Diagnosis not present

## 2022-02-11 DIAGNOSIS — D62 Acute posthemorrhagic anemia: Secondary | ICD-10-CM | POA: Diagnosis not present

## 2022-02-11 DIAGNOSIS — D631 Anemia in chronic kidney disease: Secondary | ICD-10-CM | POA: Diagnosis not present

## 2022-02-11 DIAGNOSIS — G8929 Other chronic pain: Secondary | ICD-10-CM | POA: Diagnosis not present

## 2022-02-11 DIAGNOSIS — J310 Chronic rhinitis: Secondary | ICD-10-CM | POA: Diagnosis not present

## 2022-02-11 DIAGNOSIS — N179 Acute kidney failure, unspecified: Secondary | ICD-10-CM | POA: Diagnosis not present

## 2022-02-12 DIAGNOSIS — J310 Chronic rhinitis: Secondary | ICD-10-CM | POA: Diagnosis not present

## 2022-02-12 DIAGNOSIS — E43 Unspecified severe protein-calorie malnutrition: Secondary | ICD-10-CM | POA: Diagnosis not present

## 2022-02-12 DIAGNOSIS — E559 Vitamin D deficiency, unspecified: Secondary | ICD-10-CM | POA: Diagnosis not present

## 2022-02-12 DIAGNOSIS — J302 Other seasonal allergic rhinitis: Secondary | ICD-10-CM | POA: Diagnosis not present

## 2022-02-12 DIAGNOSIS — E860 Dehydration: Secondary | ICD-10-CM | POA: Diagnosis not present

## 2022-02-12 DIAGNOSIS — D631 Anemia in chronic kidney disease: Secondary | ICD-10-CM | POA: Diagnosis not present

## 2022-02-12 DIAGNOSIS — M800AXD Age-related osteoporosis with current pathological fracture, other site, subsequent encounter for fracture with routine healing: Secondary | ICD-10-CM | POA: Diagnosis not present

## 2022-02-12 DIAGNOSIS — K8044 Calculus of bile duct with chronic cholecystitis without obstruction: Secondary | ICD-10-CM | POA: Diagnosis not present

## 2022-02-12 DIAGNOSIS — N179 Acute kidney failure, unspecified: Secondary | ICD-10-CM | POA: Diagnosis not present

## 2022-02-12 DIAGNOSIS — N1832 Chronic kidney disease, stage 3b: Secondary | ICD-10-CM | POA: Diagnosis not present

## 2022-02-12 DIAGNOSIS — E538 Deficiency of other specified B group vitamins: Secondary | ICD-10-CM | POA: Diagnosis not present

## 2022-02-12 DIAGNOSIS — D62 Acute posthemorrhagic anemia: Secondary | ICD-10-CM | POA: Diagnosis not present

## 2022-02-12 DIAGNOSIS — M5432 Sciatica, left side: Secondary | ICD-10-CM | POA: Diagnosis not present

## 2022-02-12 DIAGNOSIS — Z9181 History of falling: Secondary | ICD-10-CM | POA: Diagnosis not present

## 2022-02-12 DIAGNOSIS — K5900 Constipation, unspecified: Secondary | ICD-10-CM | POA: Diagnosis not present

## 2022-02-12 DIAGNOSIS — G8929 Other chronic pain: Secondary | ICD-10-CM | POA: Diagnosis not present

## 2022-02-13 DIAGNOSIS — E860 Dehydration: Secondary | ICD-10-CM | POA: Diagnosis not present

## 2022-02-13 DIAGNOSIS — K5900 Constipation, unspecified: Secondary | ICD-10-CM | POA: Diagnosis not present

## 2022-02-13 DIAGNOSIS — D631 Anemia in chronic kidney disease: Secondary | ICD-10-CM | POA: Diagnosis not present

## 2022-02-13 DIAGNOSIS — N179 Acute kidney failure, unspecified: Secondary | ICD-10-CM | POA: Diagnosis not present

## 2022-02-13 DIAGNOSIS — K8044 Calculus of bile duct with chronic cholecystitis without obstruction: Secondary | ICD-10-CM | POA: Diagnosis not present

## 2022-02-13 DIAGNOSIS — J310 Chronic rhinitis: Secondary | ICD-10-CM | POA: Diagnosis not present

## 2022-02-13 DIAGNOSIS — E538 Deficiency of other specified B group vitamins: Secondary | ICD-10-CM | POA: Diagnosis not present

## 2022-02-13 DIAGNOSIS — D62 Acute posthemorrhagic anemia: Secondary | ICD-10-CM | POA: Diagnosis not present

## 2022-02-13 DIAGNOSIS — G8929 Other chronic pain: Secondary | ICD-10-CM | POA: Diagnosis not present

## 2022-02-13 DIAGNOSIS — J302 Other seasonal allergic rhinitis: Secondary | ICD-10-CM | POA: Diagnosis not present

## 2022-02-13 DIAGNOSIS — E43 Unspecified severe protein-calorie malnutrition: Secondary | ICD-10-CM | POA: Diagnosis not present

## 2022-02-13 DIAGNOSIS — M5432 Sciatica, left side: Secondary | ICD-10-CM | POA: Diagnosis not present

## 2022-02-13 DIAGNOSIS — M800AXD Age-related osteoporosis with current pathological fracture, other site, subsequent encounter for fracture with routine healing: Secondary | ICD-10-CM | POA: Diagnosis not present

## 2022-02-13 DIAGNOSIS — N1832 Chronic kidney disease, stage 3b: Secondary | ICD-10-CM | POA: Diagnosis not present

## 2022-02-13 DIAGNOSIS — E559 Vitamin D deficiency, unspecified: Secondary | ICD-10-CM | POA: Diagnosis not present

## 2022-02-13 DIAGNOSIS — Z9181 History of falling: Secondary | ICD-10-CM | POA: Diagnosis not present

## 2022-02-15 ENCOUNTER — Telehealth: Payer: Self-pay | Admitting: Physician Assistant

## 2022-02-15 ENCOUNTER — Ambulatory Visit (INDEPENDENT_AMBULATORY_CARE_PROVIDER_SITE_OTHER): Payer: Medicare Other | Admitting: Physician Assistant

## 2022-02-15 ENCOUNTER — Other Ambulatory Visit: Payer: Self-pay

## 2022-02-15 ENCOUNTER — Encounter: Payer: Self-pay | Admitting: Physician Assistant

## 2022-02-15 VITALS — Ht <= 58 in | Wt 75.0 lb

## 2022-02-15 DIAGNOSIS — D72829 Elevated white blood cell count, unspecified: Secondary | ICD-10-CM | POA: Diagnosis not present

## 2022-02-15 DIAGNOSIS — E860 Dehydration: Secondary | ICD-10-CM

## 2022-02-15 DIAGNOSIS — N1832 Chronic kidney disease, stage 3b: Secondary | ICD-10-CM | POA: Diagnosis not present

## 2022-02-15 DIAGNOSIS — N189 Chronic kidney disease, unspecified: Secondary | ICD-10-CM | POA: Diagnosis not present

## 2022-02-15 DIAGNOSIS — S32512D Fracture of superior rim of left pubis, subsequent encounter for fracture with routine healing: Secondary | ICD-10-CM | POA: Diagnosis not present

## 2022-02-15 DIAGNOSIS — S3282XA Multiple fractures of pelvis without disruption of pelvic ring, initial encounter for closed fracture: Secondary | ICD-10-CM

## 2022-02-15 DIAGNOSIS — S32110D Nondisplaced Zone I fracture of sacrum, subsequent encounter for fracture with routine healing: Secondary | ICD-10-CM | POA: Diagnosis not present

## 2022-02-15 DIAGNOSIS — S32592D Other specified fracture of left pubis, subsequent encounter for fracture with routine healing: Secondary | ICD-10-CM | POA: Diagnosis not present

## 2022-02-15 DIAGNOSIS — Z09 Encounter for follow-up examination after completed treatment for conditions other than malignant neoplasm: Secondary | ICD-10-CM | POA: Diagnosis not present

## 2022-02-15 DIAGNOSIS — D631 Anemia in chronic kidney disease: Secondary | ICD-10-CM | POA: Diagnosis not present

## 2022-02-15 DIAGNOSIS — S32519A Fracture of superior rim of unspecified pubis, initial encounter for closed fracture: Secondary | ICD-10-CM | POA: Insufficient documentation

## 2022-02-15 NOTE — Progress Notes (Signed)
Telehealth office visit note for Julia Reid, PA-C- at Primary Care at [facility]   I connected with current patient today by telephone and verified that I am speaking with the correct person    Location of the patient: Home  Location of the provider: Office - This visit type was conducted due to national recommendations for restrictions regarding the COVID-19 Pandemic (e.g. social distancing) in an effort to limit this patient's exposure and mitigate transmission in our community.    - No physical exam could be performed with this format, beyond that communicated to [place] by the patient/ family members as noted.   - Additionally my office staff/ schedulers were to discuss with the patient that there may be a monetary charge related to this service, depending on their medical insurance.  My understanding is that patient understood and consented to proceed.     _________________________________________________________________________________   History of Present Illness: Patient calls in for hospital follow-up. Patient not able to come for in-person visit.  Patient's son Julia Kerr is on the line and providing most of the history. Patient's son reports she was at the SNF for 2 weeks and then was discharged. States pt has followed up with Orthopedics with recommendations to continue home health PT/OT. States patient slowing improving. Patient is ambulating with the assistance of walker and has help with ADL's. Denies new falls. States patient did have blood work at [facility].   Discharge summary: Admit date: 01/17/2022 Discharge date: 01/19/2022   Time spent: 35 minutes.   Recommendations for Outpatient Follow-up:  Follow-up with your PCP within a week.   Take your medications as prescribed.   Continue PT OT with assistance and fall precautions.   Discharge Diagnoses:      Active Hospital Problems    Diagnosis Date Noted   Multiple closed pelvic fractures without disruption of pelvic  circle ([place]) 01/17/2022   Multiple closed pelvic fractures without disruption of pelvic ring ([place]) 01/18/2022   Anemia associated with chronic renal failure 01/17/2022   CKD (chronic kidney disease), stage III ([place]) 08/17/2018     Resolved Hospital Problems  No resolved problems to display.      Discharge Condition: Stable   Diet recommendation: Resume previous diet.       Vitals:    01/19/22 0414 01/19/22 0746  BP: 112/64 (!) 154/69  Pulse: 87 83  Resp: 17 17  Temp: 98.1 F (36.7 C) 98.1 F (36.7 C)  SpO2: 98% 100%      History of present illness:   Julia Kerr is a 88 y.o. female with medical history significant of anemia associated with stage 3b CKD presenting with a mechanical fall, fell off a narrow bench when it shifted under her.   Work-up revealed multiple acute pelvic fractures, small anterior pelvic hematoma, on CT left hip without contrast 01/17/2022.  Seen by orthopedic surgery, recommended conservative management, no plan for surgical intervention.  Seen by PT OT with recommendation for SNF.  TOC consulted to assist with SNF placement.   01/19/2022: Patient was seen and examined at bedside using interpreter via video chat.  States no pain when she does not move.  At baseline she uses a cane.  She was assessed by PT OT with recommendation for SNF.  Her son in the room is receptive to SNF placement.     Hospital Course:  Principal Problem:   Multiple closed pelvic fractures without disruption of pelvic circle (HCC) Active Problems:   CKD (  chronic kidney disease), stage III (HCC)   Anemia associated with chronic renal failure   Multiple closed pelvic fractures without disruption of pelvic ring (HCC)   Multiple acute pelvic fractures without disruption of pelvic circle ([place])- (present on admission) -Generally healthy patient presenting with mechanical fall, multiple pelvis fractures Fell off a narrow bench when it shifted under her.  Seen by orthopedic surgery,  recommended conservative management, no plan for surgical intervention.  Evaluated by PT OT with recommendation for SNF.  Orthopedic surgery recommends weightbearing as tolerated bilateral lower extremity. Continue as needed analgesics Follow-up with orthopedic surgery, Dr. Lyla Glassing, in 3 weeks.   Acute blood loss anemia in the setting of pelvic fractures, complicated by anemia of chronic disease associated with chronic renal failure- (present on admission) -Seen by oncology Dr. Lorenso Courier in 09/2021 -She was referred to nephrology and started on Epo injections Initially noted to have drop in hemoglobin 7.2K from baseline of 9K Hemoglobin uptrending 7.4 from 7.2. No overt bleeding. Follow-up with your PCP within 1 week, repeat CBC 01/25/2022.   AKI on CKD (chronic kidney disease), stage IIIB ([place])- (present on admission), prerenal secondary to dehydration. Creatinine uptrending 1.9 from 1.6 Continue to avoid nephrotoxic agents, dehydration and hypotension. Repeat BMP on 01/25/2022. Follow-up with your PCP within a week.   Leukocytosis, suspect reactive in the setting of multiple acute pelvic fractures. Presented with WBC of 13.3, WBC is downtrending Nonseptic appearing and afebrile   Hypomagnesemia Serum magnesium 1.6 Repleted orally with mag oxide 800 mg x 1   Hyperphosphatemia, multifactorial, in the setting of chronic kidney disease Phosphorus 4.9 Follow-up with your PCP     Advance Care Planning:   Code Status: Full Code    Consults: Orthopedics; PT/OT; Nutrition; TOC team   Family Communication: Updated her son in person on 01/19/2022.     No flowsheet data found.  Depression screen [facility] 2/9 02/15/2022 09/05/2021 04/10/2021 07/21/2020 01/10/2019  Decreased Interest 0 1 0 0 0  Down, Depressed, Hopeless 0 0 0 0 0  PHQ - 2 Score 0 1 0 0 0  Altered sleeping - 0 0 0 -  Tired, decreased energy - 1 1 0 -  Change in appetite - 1 0 0 -  Feeling bad or failure about yourself  - 0 0 0 -   Trouble concentrating - 0 0 0 -  Moving slowly or fidgety/restless - 0 0 0 -  Suicidal thoughts - 0 0 0 -  PHQ-9 Score - 3 1 0 -  Difficult doing work/chores - - - - -      Impression and Recommendations:     1. Hospital discharge follow-up   2. Multiple closed fractures of pelvis without disruption of pelvic ring, initial encounter ([place])   3. Stage 3b chronic kidney disease ([place])   4. Dehydration, moderate   5. Anemia associated with chronic renal failure   6. Leukocytosis, unspecified type     Hospital discharge follow-up: -Reviewed hospital notes, labs and imaging. -Have not received discharge summary from SNF, will request records from [facility] to continue to home health PT/OT and follow up with Orthopedic surgeon as advised.  Multiple closed fractures of pelvis without disruption of pelvic ring, initial encounter: -No surgical intervention.  -Continue home health PT/OT. -Continue to follow-up with Orthopedic surgeon as advised.  Anemia associated with chronic renal failure: -Recommend to follow-up with nephrology for EPO injection. -Will review labs from SNF once received.  Stage 3b chronic kidney  disease: -Recommend to follow up with nephrology. -Will review labs from SNF once received. -Continue to avoid nephrotoxic substances such as NSAIDs.  Leukocytosis: -Will review labs from SNF once received.  -Last CBC 01/19/2022: WBC 12.2  Moderate dehydration: -Resolved. -Will request records from SNF.   - As part of my medical decision making, I reviewed the following data within the [place] History obtained from pt /family, CMA notes reviewed and incorporated if applicable, Labs reviewed, Radiograph/ tests reviewed if applicable and OV notes from prior OV's with me, as well as any other specialists she/he has seen since seeing me last, were all reviewed and used in my medical decision making process today.    -  Additionally, when appropriate, discussion had with patient regarding our treatment plan, and their biases/concerns about that plan were used in my medical decision making today.    - The patient agreed with the plan and demonstrated an understanding of the instructions.   No barriers to understanding were identified.     - The patient was advised to call back or seek an in-person evaluation if the symptoms worsen or if the condition fails to improve as anticipated.   Return if symptoms worsen or fail to improve.    No orders of the defined types were placed in this encounter.   No orders of the defined types were placed in this encounter.   There are no discontinued medications.     Time spent on telephone encounter and reviewing hospital records was 20 minutes.   Note:  This note was prepared with assistance of Dragon voice recognition software. Occasional wrong-word or sound-a-like substitutions may have occurred due to the inherent limitations of voice recognition software.    The [place] was signed into law in 2016 which includes the topic of electronic health records.  This provides immediate access to information in MyChart.  This includes consultation notes, operative notes, office notes, lab results and pathology reports.  If you have any questions about what you read please let us know at your next visit or call us at the office.  We are right here with you.   __________________________________________________________________________________     Patient Care Team    Relationship Specialty Notifications Start End  Julia Kerr, [place] PCP - General   04/20/20      -Vitals obtained; medications/ allergies reconciled;  personal medical, social, Sx etc.histories were updated by CMA, reviewed by me and are reflected in chart   Patient Active Problem List   Diagnosis Date Noted   Multiple closed pelvic fractures without disruption of pelvic ring ([place])  01/18/2022   Multiple closed pelvic fractures without disruption of pelvic circle ([place]) 01/17/2022   Anemia associated with chronic renal failure 01/17/2022   Seasonal allergic rhinitis 04/12/2021   Left sided sciatica 04/12/2021   Chronic rhinitis 02/06/2020   Adverse food reaction 02/06/2020   Vaccine counseling 02/06/2020   Cellulitis of lower extremity 08/18/2019   Insect bite 08/01/2019   Low serum thyroid stimulating hormone (TSH) 08/17/2018   Normocytic normochromic anemia 08/17/2018   CKD (chronic kidney disease), stage III ([place]) 08/17/2018   Dehydration, moderate 08/17/2018   Low HDL (under 40) 08/17/2018   Vitamin D deficiency 08/04/2018   B12 deficiency-requiring injections in past 08/04/2018   Bruit of right carotid artery 08/04/2018   Choledocholithiasis with chronic cholecystitis- not amenable to surgery due to age 79/02/[birth year]32   Biliary colic symptom- intolerant to dairy and fats due to  gallstone presence 08/04/2018   Environmental and seasonal allergies 08/04/2018   Low weight for height- bmi 17.5 08/04/2018   Postmenopausal bone loss 08/04/2018   Chronic fatigue- with B12 def 08/04/2018     Current Meds  Medication Sig   acetaminophen (TYLENOL) 500 MG tablet Take 500 mg by mouth every 6 (six) hours as needed for mild pain.   Multiple Vitamin (MULTIVITAMIN WITH MINERALS) TABS tablet Take 1 tablet by mouth daily.   polyethylene glycol (MIRALAX / GLYCOLAX) 17 g packet Take 17 g by mouth daily as needed for mild constipation.   Polyethylene Glycol 400 (VISINE DRY EYE RELIEF OP) Place 1 drop into both eyes as needed (dry eyes).     Allergies:  Allergies  Allergen Reactions   Food Other (See Comments)    Apples and strawberries cause burning of lips     ROS:  See above HPI for pertinent positives and negatives   Objective:   Height 4\' 8"  (1.422 m), weight 75 lb (34 kg).  (if some vitals are omitted, this means that patient was UNABLE to obtain  them.) General: A & O * 3; sounds in no acute distress Respiratory: speaking in full sentences, no conversational dyspnea Psych: insight appears good, mood- appears full

## 2022-02-15 NOTE — Patient Instructions (Signed)
Prevencin de cadas en Engineer, mining, en adultos Fall Prevention in the Home, Adult Las cadas pueden causar lesiones y Print production planner a Engineer, manufacturing de todas las edades. Hay muchas cosas simples que puede hacer para que su casa sea un lugar seguro y ayudar a prevenir las cadas. Pida ayuda cuando haga estos cambios, si la necesita. Qu medidas puedo tomar para prevenir cadas? Indicaciones generales Use una buena iluminacin en todos los [place]. Sustituya las [employer] se quemen, encienda las luces si est oscuro y utilice luces nocturnas. Coloque los [place] con frecuencia en lugares de fcil acceso. Baje los estantes de toda la casa de ser necesario. Disponga los muebles de modo de que haya espacio para caminar a su alrededor. Evite cambiar los [employer] de Environmental consultant. Quite las alfombras y todo lo que sea un riesgo de tropiezo. Evite caminar sobre pisos mojados. Arregle las superficies desparejas del piso. Aada pintura o cinta de contraste de colores a las barras para sostn y los pasamanos en su casa. Coloque tiras de contraste de Psychiatrist y el ltimo escaln de las escaleras. Cuando use una escalera de [place], asegrese de que est abierta por completo y de que los lados y 24 estn bien asegurados. Pdale a alguien que sostenga la escalera mientras usted la est [place]. No suba a una escalera de mano cerrada. Sepa dnde estn sus mascotas cuando se desplace por su casa. Qu puedo hacer en el bao?   Mantenga el piso seco. Seque de inmediato cualquier derrame de agua en el piso. Elimine con frecuencia la acumulacin de jabn en la baera o la ducha. Utilice alfombras o pegatinas antideslizantes en el piso de la baera o ducha. Asegure las alfombras del bao con una cinta antideslizante doble faz para alfombras. Si necesita sentarse mientras se ducha, use un banco plstico antideslizante. Instale barras para sostn al lado del inodoro, en la baera y en la ducha. No use los toalleros  como barras de apoyo. Qu puedo hacer en el dormitorio? Asegrese de tener una luz de fcil acceso al lado de la cama. No use ropa de cama muy grande que llegue al piso. Tenga una silla firme con apoyabrazos para usar cuando se vista. Qu puedo hacer en la cocina? Limpie de inmediato cualquier derrame. Si necesita alcanzar algo que est alto, use un banco escalera firme con una barra de apoyo. Mantenga los cables elctricos fuera del camino. No use un pulidor o cera para pisos que dejen los pisos resbaladizos. Si debe usar cera, asegrese de que sea cera antideslizante para pisos. Qu puedo hacer con las escaleras? No deje ningn objeto en las escaleras. Asegrese de tener un interruptor de luz en la parte superior e inferior de las escaleras. Si no lo tiene, instale uno. Asegrese de que haya pasamanos en ambos lados de las escaleras. Repare los pasamanos que estn flojos o rotos. Asegrese de que los pasamanos tengan la misma longitud que las escaleras. Instale peldaos antideslizantes en todas las escaleras de su casa. Evite colocar alfombras en la parte superior o inferior de las escaleras, o asegure las alfombras con cinta adhesiva para alfombras a fin de evitar que se muevan. Para las escaleras, elija un diseo de alfombra que no oculte el borde de los [employer]. Verifique que las alfombras estn bien adheridas a las escaleras. [place]. Qu puedo hacer en el exterior de mi casa? Use una iluminacin brillante en el exterior. Repare peridicamente los bordes de las aceras  y las calzadas, as [place] grietas. Retire los [employer]. Recorte los arbustos en el camino principal de ingreso a Administrator, arts. Verifique con frecuencia que los pasamanos estn bien ajustados y en buen [place]. Ambos lados de los escalones deben tener pasamanos. Instale barandillas de proteccin en los bordes de las terrazas o galeras elevadas. Elimine los residuos y las cosas  [employer] pasillos, lo que incluye herramientas y piedras. Limpie regularmente las hojas, la nieve y el hielo. Utilice arena o sal en los pasillos durante los meses de invierno. En el garaje, limpie de inmediato cualquier derrame, incluidos los derrames de grasa y aceite. Qu otras medidas puedo tomar? Use calzado [employer] dedos que le quede bien y [employer]. Use calzado con suela de goma o taco bajo. Utilice dispositivos de movilidad, como bastones, andadores, patinetes con soporte para el pie y [place]. Revise los medicamentos con el mdico. Algunos medicamentos pueden causar mareos o cambios en la presin arterial, lo que aumenta el riesgo de cadas. Hable con el mdico sobre otras maneras de reducir el riesgo de cadas. Esto puede incluir trabajar con un fisioterapeuta o un entrenador para mejorar la fuerza, el equilibrio y Sport and exercise psychologist. Dnde buscar ms informacin Centers for Disease Control and Prevention (Centros para el Control y la Prevencin de [place]), STEADI: http://www.wolf.info/ National Institute on Aging ([place] sobre el Envejecimiento): http://kim-miller.com/ Comunquese con un mdico si: Tiene miedo de caerse en su casa. Se siente dbil, somnoliento o mareado en su casa. Se cae en su casa. Resumen Hay muchas cosas simples que puede hacer para que su casa sea un lugar seguro y ayudar a prevenir las cadas. Algunas formas de garantizar la seguridad en su casa incluyen eliminar cosas que representen un riesgo de tropiezo e instalar barras para sostn en el bao. Pida ayuda cuando haga estos cambios en su hogar. Esta informacin no tiene Marine scientist el consejo del mdico. Asegrese de hacerle al mdico cualquier pregunta que tenga. Document Revised: 08/13/2020 Document Reviewed: 08/13/2020 Elsevier Patient Education  2022 [employer].

## 2022-02-15 NOTE — Telephone Encounter (Signed)
She shouldn't need to since were her PCP. If they request that it be signed, can you contact her son and have him come sign the form.

## 2022-02-15 NOTE — Telephone Encounter (Signed)
Requested records.

## 2022-02-15 NOTE — Telephone Encounter (Signed)
Can you please send record request to [facility] in [place] for this patient?   AS, CMA

## 2022-02-15 NOTE — Telephone Encounter (Signed)
Does she not need a signed release?

## 2022-02-16 DIAGNOSIS — J310 Chronic rhinitis: Secondary | ICD-10-CM | POA: Diagnosis not present

## 2022-02-16 DIAGNOSIS — E559 Vitamin D deficiency, unspecified: Secondary | ICD-10-CM | POA: Diagnosis not present

## 2022-02-16 DIAGNOSIS — Z9181 History of falling: Secondary | ICD-10-CM | POA: Diagnosis not present

## 2022-02-16 DIAGNOSIS — N1832 Chronic kidney disease, stage 3b: Secondary | ICD-10-CM | POA: Diagnosis not present

## 2022-02-16 DIAGNOSIS — G8929 Other chronic pain: Secondary | ICD-10-CM | POA: Diagnosis not present

## 2022-02-16 DIAGNOSIS — E43 Unspecified severe protein-calorie malnutrition: Secondary | ICD-10-CM | POA: Diagnosis not present

## 2022-02-16 DIAGNOSIS — M800AXD Age-related osteoporosis with current pathological fracture, other site, subsequent encounter for fracture with routine healing: Secondary | ICD-10-CM | POA: Diagnosis not present

## 2022-02-16 DIAGNOSIS — E538 Deficiency of other specified B group vitamins: Secondary | ICD-10-CM | POA: Diagnosis not present

## 2022-02-16 DIAGNOSIS — K8044 Calculus of bile duct with chronic cholecystitis without obstruction: Secondary | ICD-10-CM | POA: Diagnosis not present

## 2022-02-16 DIAGNOSIS — J302 Other seasonal allergic rhinitis: Secondary | ICD-10-CM | POA: Diagnosis not present

## 2022-02-16 DIAGNOSIS — E860 Dehydration: Secondary | ICD-10-CM | POA: Diagnosis not present

## 2022-02-16 DIAGNOSIS — N179 Acute kidney failure, unspecified: Secondary | ICD-10-CM | POA: Diagnosis not present

## 2022-02-16 DIAGNOSIS — D631 Anemia in chronic kidney disease: Secondary | ICD-10-CM | POA: Diagnosis not present

## 2022-02-16 DIAGNOSIS — M5432 Sciatica, left side: Secondary | ICD-10-CM | POA: Diagnosis not present

## 2022-02-16 DIAGNOSIS — K5900 Constipation, unspecified: Secondary | ICD-10-CM | POA: Diagnosis not present

## 2022-02-16 DIAGNOSIS — D62 Acute posthemorrhagic anemia: Secondary | ICD-10-CM | POA: Diagnosis not present

## 2022-02-17 DIAGNOSIS — J310 Chronic rhinitis: Secondary | ICD-10-CM | POA: Diagnosis not present

## 2022-02-17 DIAGNOSIS — K5900 Constipation, unspecified: Secondary | ICD-10-CM | POA: Diagnosis not present

## 2022-02-17 DIAGNOSIS — E43 Unspecified severe protein-calorie malnutrition: Secondary | ICD-10-CM | POA: Diagnosis not present

## 2022-02-17 DIAGNOSIS — E538 Deficiency of other specified B group vitamins: Secondary | ICD-10-CM | POA: Diagnosis not present

## 2022-02-17 DIAGNOSIS — D631 Anemia in chronic kidney disease: Secondary | ICD-10-CM | POA: Diagnosis not present

## 2022-02-17 DIAGNOSIS — M5432 Sciatica, left side: Secondary | ICD-10-CM | POA: Diagnosis not present

## 2022-02-17 DIAGNOSIS — J302 Other seasonal allergic rhinitis: Secondary | ICD-10-CM | POA: Diagnosis not present

## 2022-02-17 DIAGNOSIS — K8044 Calculus of bile duct with chronic cholecystitis without obstruction: Secondary | ICD-10-CM | POA: Diagnosis not present

## 2022-02-17 DIAGNOSIS — G8929 Other chronic pain: Secondary | ICD-10-CM | POA: Diagnosis not present

## 2022-02-17 DIAGNOSIS — M800AXD Age-related osteoporosis with current pathological fracture, other site, subsequent encounter for fracture with routine healing: Secondary | ICD-10-CM | POA: Diagnosis not present

## 2022-02-17 DIAGNOSIS — Z9181 History of falling: Secondary | ICD-10-CM | POA: Diagnosis not present

## 2022-02-17 DIAGNOSIS — E559 Vitamin D deficiency, unspecified: Secondary | ICD-10-CM | POA: Diagnosis not present

## 2022-02-17 DIAGNOSIS — E860 Dehydration: Secondary | ICD-10-CM | POA: Diagnosis not present

## 2022-02-17 DIAGNOSIS — N179 Acute kidney failure, unspecified: Secondary | ICD-10-CM | POA: Diagnosis not present

## 2022-02-17 DIAGNOSIS — D62 Acute posthemorrhagic anemia: Secondary | ICD-10-CM | POA: Diagnosis not present

## 2022-02-17 DIAGNOSIS — N1832 Chronic kidney disease, stage 3b: Secondary | ICD-10-CM | POA: Diagnosis not present

## 2022-02-19 ENCOUNTER — Observation Stay
Admission: EM | Admit: 2022-02-19 | Discharge: 2022-02-20 | Disposition: A | Discharge status: Home / Self Care | Payer: Medicare Other | Attending: Obstetrics and Gynecology | Admitting: Obstetrics and Gynecology

## 2022-02-19 ENCOUNTER — Telehealth: Payer: Self-pay | Admitting: Nephrology

## 2022-02-19 ENCOUNTER — Other Ambulatory Visit: Payer: Self-pay

## 2022-02-19 DIAGNOSIS — K5900 Constipation, unspecified: Secondary | ICD-10-CM | POA: Diagnosis not present

## 2022-02-19 DIAGNOSIS — Z20822 Contact with and (suspected) exposure to covid-19: Secondary | ICD-10-CM | POA: Insufficient documentation

## 2022-02-19 DIAGNOSIS — D649 Anemia, unspecified: Secondary | ICD-10-CM | POA: Diagnosis not present

## 2022-02-19 DIAGNOSIS — D631 Anemia in chronic kidney disease: Secondary | ICD-10-CM | POA: Diagnosis not present

## 2022-02-19 DIAGNOSIS — E538 Deficiency of other specified B group vitamins: Secondary | ICD-10-CM | POA: Diagnosis not present

## 2022-02-19 DIAGNOSIS — J302 Other seasonal allergic rhinitis: Secondary | ICD-10-CM | POA: Diagnosis not present

## 2022-02-19 DIAGNOSIS — Z9181 History of falling: Secondary | ICD-10-CM | POA: Diagnosis not present

## 2022-02-19 DIAGNOSIS — N183 Chronic kidney disease, stage 3 unspecified: Secondary | ICD-10-CM | POA: Diagnosis present

## 2022-02-19 DIAGNOSIS — K8044 Calculus of bile duct with chronic cholecystitis without obstruction: Secondary | ICD-10-CM | POA: Diagnosis not present

## 2022-02-19 DIAGNOSIS — N1832 Chronic kidney disease, stage 3b: Secondary | ICD-10-CM | POA: Diagnosis not present

## 2022-02-19 DIAGNOSIS — N179 Acute kidney failure, unspecified: Secondary | ICD-10-CM | POA: Diagnosis not present

## 2022-02-19 DIAGNOSIS — E860 Dehydration: Secondary | ICD-10-CM | POA: Diagnosis not present

## 2022-02-19 DIAGNOSIS — N189 Chronic kidney disease, unspecified: Secondary | ICD-10-CM

## 2022-02-19 DIAGNOSIS — D62 Acute posthemorrhagic anemia: Secondary | ICD-10-CM | POA: Diagnosis not present

## 2022-02-19 DIAGNOSIS — R7989 Other specified abnormal findings of blood chemistry: Secondary | ICD-10-CM | POA: Diagnosis present

## 2022-02-19 DIAGNOSIS — G8929 Other chronic pain: Secondary | ICD-10-CM | POA: Diagnosis not present

## 2022-02-19 DIAGNOSIS — E43 Unspecified severe protein-calorie malnutrition: Secondary | ICD-10-CM | POA: Diagnosis not present

## 2022-02-19 DIAGNOSIS — J310 Chronic rhinitis: Secondary | ICD-10-CM | POA: Diagnosis not present

## 2022-02-19 DIAGNOSIS — R531 Weakness: Secondary | ICD-10-CM | POA: Diagnosis not present

## 2022-02-19 DIAGNOSIS — M800AXD Age-related osteoporosis with current pathological fracture, other site, subsequent encounter for fracture with routine healing: Secondary | ICD-10-CM | POA: Diagnosis not present

## 2022-02-19 DIAGNOSIS — M5432 Sciatica, left side: Secondary | ICD-10-CM | POA: Diagnosis not present

## 2022-02-19 DIAGNOSIS — R42 Dizziness and giddiness: Secondary | ICD-10-CM | POA: Diagnosis not present

## 2022-02-19 DIAGNOSIS — E559 Vitamin D deficiency, unspecified: Secondary | ICD-10-CM | POA: Diagnosis not present

## 2022-02-19 LAB — CBC
HCT: 19 % — ABNORMAL LOW (ref 36.0–46.0)
Hemoglobin: 5.4 g/dL — ABNORMAL LOW (ref 12.0–15.0)
MCH: 28.1 pg (ref 26.0–34.0)
MCHC: 28.4 g/dL — ABNORMAL LOW (ref 30.0–36.0)
MCV: 99 fL (ref 80.0–100.0)
Platelets: 168 10*3/uL (ref 150–400)
RBC: 1.92 MIL/uL — ABNORMAL LOW (ref 3.87–5.11)
RDW: 21.3 % — ABNORMAL HIGH (ref 11.5–15.5)
WBC: 9 10*3/uL (ref 4.0–10.5)
nRBC: 0.2 % (ref 0.0–0.2)

## 2022-02-19 LAB — RESP PANEL BY RT-PCR (FLU A&B, COVID) ARPGX2
Influenza A by PCR: NEGATIVE
Influenza B by PCR: NEGATIVE
SARS Coronavirus 2 by RT PCR: NEGATIVE

## 2022-02-19 LAB — COMPREHENSIVE METABOLIC PANEL
ALT: 15 U/L (ref 0–44)
AST: 22 U/L (ref 15–41)
Albumin: 3.8 g/dL (ref 3.5–5.0)
Alkaline Phosphatase: 123 U/L (ref 38–126)
Anion gap: 10 (ref 5–15)
BUN: 40 mg/dL — ABNORMAL HIGH (ref 8–23)
CO2: 21 mmol/L — ABNORMAL LOW (ref 22–32)
Calcium: 8.6 mg/dL — ABNORMAL LOW (ref 8.9–10.3)
Chloride: 109 mmol/L (ref 98–111)
Creatinine, Ser: 1.51 mg/dL — ABNORMAL HIGH (ref 0.44–1.00)
GFR, Estimated: 31 mL/min — ABNORMAL LOW (ref >60)
Glucose, Bld: 146 mg/dL — ABNORMAL HIGH (ref 70–99)
Potassium: 4.4 mmol/L (ref 3.5–5.1)
Sodium: 140 mmol/L (ref 135–145)
Total Bilirubin: 0.9 mg/dL (ref 0.3–1.2)
Total Protein: 6.7 g/dL (ref 6.5–8.1)

## 2022-02-19 LAB — PREPARE RBC (CROSSMATCH)

## 2022-02-19 LAB — ABO/RH: ABO/RH(D): A POS

## 2022-02-19 MED ORDER — SODIUM CHLORIDE 0.9% FLUSH: 3.0000 mL | INTRAVENOUS | Status: DC | PRN

## 2022-02-19 MED ORDER — ACETAMINOPHEN 325 MG PO TABS: 650.0000 mg | ORAL_TABLET | Freq: Four times a day (QID) | ORAL | Status: DC | PRN

## 2022-02-19 MED ORDER — SODIUM CHLORIDE 0.9% FLUSH
3.0000 mL | Freq: Two times a day (BID) | INTRAVENOUS | Status: DC
  Administered 2022-02-19 – 2022-02-20 (×2): 3 mL via INTRAVENOUS

## 2022-02-19 MED ORDER — SODIUM CHLORIDE 0.9% IV SOLUTION
Freq: Once | INTRAVENOUS | Status: AC
  Filled 2022-02-19: qty 250

## 2022-02-19 MED ORDER — SODIUM CHLORIDE 0.9 % IV SOLN: 250.0000 mL | INTRAVENOUS | Status: DC | PRN

## 2022-02-19 MED ORDER — SODIUM CHLORIDE 0.9 % IV SOLN: 10.0000 mL/h | Freq: Once | INTRAVENOUS | Status: DC

## 2022-02-19 MED ORDER — ACETAMINOPHEN 650 MG RE SUPP: 650.0000 mg | Freq: Four times a day (QID) | RECTAL | Status: DC | PRN

## 2022-02-19 NOTE — Assessment & Plan Note (Signed)
We will recheck. ? ?

## 2022-02-19 NOTE — Assessment & Plan Note (Signed)
Clinically stable no complaints of any abdominal pain or any symptoms. ?

## 2022-02-19 NOTE — Assessment & Plan Note (Signed)
Pt has h/o anemia with her CKD and hb pf 5.4 will transfuse 2 units PRBC. ?Per son at bedside.  ?H/h an hour after transfusion is complete. ?Monitor for any transfusion reactions.  ?

## 2022-02-19 NOTE — ED Triage Notes (Signed)
Abnormal lab result from home health nurse. Hgb 5.7 today. Advised to come to ED. No use of blood thinners. Pt endorses weakness.  ?

## 2022-02-19 NOTE — ED Provider Notes (Signed)
? ?[facility] ?Provider Note ? ? ? Event Date/Time  ? First MD Initiated Contact with Patient 02/19/22 2034   ?  (approximate) ? ? ?History  ? ?Weakness ? ? ?HPI ? ?Julia Kerr is a 88 y.o. female with a past history of CKD and anemia associated with chronic renal failure who is brought to the ED today due to dizziness and generalized weakness.  Outpatient labs showed a hemoglobin of 5.7.  I reviewed the electronic medical record, finding that her baseline hemoglobin is about 7.5. ? ?Per family, patient normally gets Epogen every 2 weeks due to her CKD and anemia.  However, she missed her last dose due to being in a rehab facility recovering from a hip fracture. ? ?No new falls or trauma.  No black or bloody stool fever or pain complaints. ?  ? ? ?Physical Exam  ? ?Triage Vital Signs: ?ED Triage Vitals  ?Enc Vitals Group  ?   BP 02/19/22 1923 (!) 122/47  ?   Pulse Rate 02/19/22 1923 88  ?   Resp 02/19/22 1923 16  ?   Temp 02/19/22 1923 98.8 ?F (37.1 ?C)  ?   Temp Source 02/19/22 1923 Oral  ?   SpO2 02/19/22 1923 99 %  ?   Weight --   ?   Height --   ?   Head Circumference --   ?   Peak Flow --   ?   Pain Score 02/19/22 1938 0  ?   Pain Loc --   ?   Pain Edu? --   ?   Excl. in [place]? --   ? ? ?Most recent vital signs: ?Vitals:  ? 02/19/22 2200 02/19/22 2300  ?BP: (!) 126/55 (!) 127/48  ?Pulse: 82 81  ?Resp: (!) 28 (!) 24  ?Temp:    ?SpO2: 100% 100%  ? ? ? ?General: Awake, no distress.  ?CV:  Good peripheral perfusion.  Regular rate and rhythm ?Resp:  Normal effort.  Clear to auscultation bilaterally ?Abd:  No distention.  Soft and nontender ?Other:  No lower extremity swelling, no rash ? ? ?ED Results / Procedures / Treatments  ? ?Labs ?(all labs ordered are listed, but only abnormal results are displayed) ?Labs Reviewed  ?CBC - Abnormal; Notable for the following components:  ?    Result Value  ? RBC 1.92 (*)   ? Hemoglobin 5.4 (*)   ? HCT 19.0 (*)   ? MCHC 28.4 (*)   ? RDW 21.3 (*)   ? All  other components within normal limits  ?COMPREHENSIVE METABOLIC PANEL - Abnormal; Notable for the following components:  ? CO2 21 (*)   ? Glucose, Bld 146 (*)   ? BUN 40 (*)   ? Creatinine, Ser 1.51 (*)   ? Calcium 8.6 (*)   ? GFR, Estimated 31 (*)   ? All other components within normal limits  ?RESP PANEL BY RT-PCR (FLU A&B, COVID) [id number]  ?TYPE AND SCREEN  ?PREPARE RBC (CROSSMATCH)  ?ABO/RH  ?PREPARE RBC (CROSSMATCH)  ? ? ? ?EKG ? ?Interpreted by me ?Normal sinus rhythm rate of 89.  Left axis, normal intervals.  Right bundle branch block.  No acute ischemic changes. ? ? ?RADIOLOGY ? ? ? ? ?PROCEDURES: ? ?Critical Care performed: Yes, see critical care procedure note(s) ? ?.Critical Care ?Performed by: Carrie Mew, MD ?Authorized by: Carrie Mew, MD  ? ?Critical care provider statement:  ?  Critical care time (minutes):  35 ?  Critical care time was exclusive of:  Separately billable procedures and treating other patients ?  Critical care was necessary to treat or prevent imminent or life-threatening deterioration of the following conditions:  Circulatory failure and renal failure ?  Critical care was time spent personally by me on the following activities:  Development of treatment plan with patient or surrogate, discussions with consultants, evaluation of patient's response to treatment, examination of patient, obtaining history from patient or surrogate, ordering and performing treatments and interventions, ordering and review of laboratory studies, ordering and review of radiographic studies, pulse oximetry, re-evaluation of patient's condition and review of old charts ?  Care discussed with: admitting provider   ? ? ?MEDICATIONS ORDERED IN ED: ?Medications  ?0.9 %  sodium chloride infusion (has no administration in time range)  ?acetaminophen (TYLENOL) tablet 650 mg (has no administration in time range)  ?  Or  ?acetaminophen (TYLENOL) suppository 650 mg (has no administration in time range)   ?sodium chloride flush (NS) 0.9 % injection 3 mL (has no administration in time range)  ?sodium chloride flush (NS) 0.9 % injection 3 mL (has no administration in time range)  ?0.9 %  sodium chloride infusion (has no administration in time range)  ?0.9 %  sodium chloride infusion (Manually program via Guardrails IV Fluids) (has no administration in time range)  ? ? ? ?IMPRESSION / MDM / ASSESSMENT AND PLAN / ED COURSE  ?I reviewed the triage vital signs and the nursing notes. ?             ?               ? ?Differential diagnosis includes, but is not limited to, acute on chronic anemia, renal failure, electrolyte normality, dehydration ? ?**The patient is on the cardiac monitor to evaluate for evidence of arrhythmia and/or significant heart rate changes.**} ? ?Patient presents with dizziness and generalized weakness in the setting of recently being unable to obtain her scheduled erythropoietin dosing.  Found to have a hemoglobin of 5.4 compared to a baseline of about 7.5.  Consistent with symptomatic anemia.  No identifiable source of acute blood loss, I think this is related to her CKD.  We will plan to transfuse.  With her CKD, advanced age, I discussed her care with the hospitalist for further management to ensure no complications from transfusion. ? ?Her family at bedside notes that the patient has been scheduled for visiting nurse to administer the erythropoietin at home, first dose is scheduled for 3 days from now on Monday. ?  ? ? ?FINAL CLINICAL IMPRESSION(S) / ED DIAGNOSES  ? ?Final diagnoses:  ?Symptomatic anemia  ? ? ? ?Rx / DC Orders  ? ?ED Discharge Orders   ? ? None  ? ?  ? ? ? ?Note:  This document was prepared using Dragon voice recognition software and may include unintentional dictation errors. ?  ?Carrie Mew, MD ?02/19/22 2329 ? ?

## 2022-02-19 NOTE — Telephone Encounter (Signed)
Received call from the outpatient lab at 5:50 this evening.  Hgb of 5.7.  Gets retacrit but missed several shots d/t pelvic fracture per her son.   ? ?I discussed with her son- recommend taking her to ED for blood transfusion- recommend 2 u pRBCs with such a low Hgb.   ? ?He is agreeable to proceed. ? ?Madelon Lips MD ?[facility] ?

## 2022-02-19 NOTE — ED Notes (Signed)
Pt placed on cardiac, BP, pulse ox monitors. ?

## 2022-02-19 NOTE — ED Notes (Signed)
Pink top tube & COVID swab sent to lab. ?

## 2022-02-19 NOTE — Assessment & Plan Note (Signed)
Lab Results  ?Component Value Date  ? CREATININE 1.51 (H) 02/19/2022  ? CREATININE 1.97 (H) 01/19/2022  ? CREATININE 1.85 (H) 01/18/2022  ?avoid contrast studies.  ? ?

## 2022-02-19 NOTE — ED Notes (Signed)
Pt here for low Hgb. Pts HHN came today to check blood work & Hgb was 5.7. Per family, pt has been more fatigued today.  ?

## 2022-02-19 NOTE — H&P (Signed)
?History and Physical  ? ? ?PatientAmiylah Kerr WUJ:[id number] DOB: 05/13/1923 ?DOA: 02/19/2022 ?DOS: the patient was seen and examined on 02/19/2022 ?PCP: [name], PA-C  ?Patient coming from: Home ? ?Chief Complaint:  ?Chief Complaint  ?Patient presents with  ? Weakness  ? ? ?HPI: Julia Kerr is a 88 y.o. female with medical history significant of anemia from ckd comes  in sent for nephrology office. For abnormal bloodwork.  Patient was seen by Dr. Hollie Salk nephrology who recommended coming to the emergency room and transfusion.  Patient does not have any complaints does not report any chest pains headaches blurred vision vomiting.  Patient is Spanish-speaking 88 year old she seldom takes any medications. ?  ?Just feeling tired  more so last.  ? ?Review of Systems: Review of Systems  ?Constitutional: Negative.   ?HENT: Negative.    ?Eyes: Negative.   ?Respiratory: Negative.    ?Cardiovascular: Negative.   ?Gastrointestinal: Negative.   ?Genitourinary: Negative.   ?Musculoskeletal: Negative.   ?Skin: Negative.   ?Neurological: Negative.   ?Endo/Heme/Allergies: Negative.   ?All other systems reviewed and are negative. ? ?Past Medical History:  ?Diagnosis Date  ? Anemia   ? ?Past Surgical History:  ?Procedure Laterality Date  ? CATARACT EXTRACTION, BILATERAL    ? SHOULDER SURGERY    ? ?Social History:  reports that she has never smoked. She has never used smokeless tobacco. She reports that she does not drink alcohol and does not use drugs. ? ?Allergies  ?Allergen Reactions  ? Food Other (See Comments)  ?  Apples and strawberries cause burning of lips  ? ? ?History reviewed. No pertinent family history. ? ?Prior to Admission medications   ?Medication Sig Start Date End Date Taking? Authorizing Provider  ?acetaminophen (TYLENOL) 500 MG tablet Take 500 mg by mouth every 6 (six) hours as needed for mild pain.    [provider]  ?Multiple Vitamin (MULTIVITAMIN WITH MINERALS) TABS tablet Take 1 tablet by mouth  daily. 01/20/22 04/20/22  Kayleen Memos, DO  ?polyethylene glycol (MIRALAX / GLYCOLAX) 17 g packet Take 17 g by mouth daily as needed for mild constipation. 01/19/22   Kayleen Memos, DO  ?Polyethylene Glycol 400 (VISINE DRY EYE RELIEF OP) Place 1 drop into both eyes as needed (dry eyes).    [provider]  ? ? ?Physical Exam: ?Vitals:  ? 02/19/22 1923 02/19/22 2045  ?BP: (!) 122/47 (!) 129/50  ?Pulse: 88 80  ?Resp: 16   ?Temp: 98.8 ?F (37.1 ?C)   ?TempSrc: Oral   ?SpO2: 99% 100%  ?Physical Exam ?Vitals and nursing note reviewed.  ?Constitutional:   ?   General: She is not in acute distress. ?   Appearance: Normal appearance. She is not ill-appearing, toxic-appearing or diaphoretic.  ?HENT:  ?   Head: Normocephalic and atraumatic.  ?   Right Ear: Hearing and external ear normal.  ?   Left Ear: Hearing and external ear normal.  ?   Nose: Nose normal. No nasal deformity.  ?   Mouth/Throat:  ?   Lips: Pink.  ?   Mouth: Mucous membranes are moist.  ?   Tongue: No lesions.  ?   Pharynx: Oropharynx is clear.  ?Eyes:  ?   Extraocular Movements: Extraocular movements intact.  ?   Pupils: Pupils are equal, round, and reactive to light.  ?Neck:  ?   Vascular: No carotid bruit.  ?Cardiovascular:  ?   Rate and Rhythm: Normal rate and regular rhythm.  ?  Pulses: Normal pulses.  ?   Heart sounds: Normal heart sounds.  ?Pulmonary:  ?   Effort: Pulmonary effort is normal.  ?   Breath sounds: Normal breath sounds.  ?Abdominal:  ?   General: Bowel sounds are normal. There is no distension.  ?   Palpations: Abdomen is soft. There is no mass.  ?   Tenderness: There is no abdominal tenderness. There is no guarding.  ?   Hernia: No hernia is present.  ?Musculoskeletal:  ?   Right lower leg: No edema.  ?   Left lower leg: No edema.  ?Skin: ?   General: Skin is warm.  ?Neurological:  ?   General: No focal deficit present.  ?   Mental Status: She is alert and oriented to person, place, and time.  ?   Cranial Nerves: Cranial nerves  2-12 are intact.  ?   Motor: Motor function is intact.  ?Psychiatric:     ?   Attention and Perception: Attention normal.     ?   Mood and Affect: Mood normal.     ?   Speech: Speech normal.     ?   Behavior: Behavior normal. Behavior is cooperative.     ?   Cognition and Memory: Cognition normal.  ? ? ?Data Reviewed: ?> CMP shows alk phos of 146, creatinine of 1.51 otherwise normal labs. ?> CBC shows white count of 9.0 hemoglobin of 5.4 platelet count of 168. ?> Respiratory panel negative for COVID. ? ?Assessment and Plan: ?Anemia associated with chronic renal failure ?Pt has h/o anemia with her CKD and hb pf 5.4 will transfuse 2 units PRBC. ?Per son at bedside.  ?H/h an hour after transfusion is complete. ?Monitor for any transfusion reactions.  ? ?CKD (chronic kidney disease), stage III ([place]) ?Lab Results  ?Component Value Date  ? CREATININE 1.51 (H) 02/19/2022  ? CREATININE 1.97 (H) 01/19/2022  ? CREATININE 1.85 (H) 01/18/2022  ?avoid contrast studies.  ? ? ?Low serum thyroid stimulating hormone (TSH) ?We will recheck. ? ? ?Choledocholithiasis with chronic cholecystitis- not amenable to surgery due to age ?Clinically stable no complaints of any abdominal pain or any symptoms. ? ?Advance Care Planning:   Code Status: Full Code  ? ?Consults:  ?None  ? ?Family Communication:  ?Julia Kerr (Son)  ?[phone] [facility]) ? ?Severity of Illness: ?The appropriate patient status for this patient is OBSERVATION. Observation status is judged to be reasonable and necessary in order to provide the required intensity of service to ensure the patient's safety. The patient's presenting symptoms, physical exam findings, and initial radiographic and laboratory data in the context of their medical condition is felt to place them at decreased risk for further clinical deterioration. Furthermore, it is anticipated that the patient will be medically stable for discharge from the hospital within 2 midnights of admission.   ? ?Author: ?Para Skeans, MD ?02/19/2022 10:54 PM ? ?For on call review [email or web address].  ?

## 2022-02-20 DIAGNOSIS — D631 Anemia in chronic kidney disease: Secondary | ICD-10-CM | POA: Diagnosis not present

## 2022-02-20 DIAGNOSIS — N1832 Chronic kidney disease, stage 3b: Secondary | ICD-10-CM | POA: Diagnosis not present

## 2022-02-20 LAB — CBC WITH DIFFERENTIAL/PLATELET
Abs Immature Granulocytes: 0.27 10*3/uL — ABNORMAL HIGH (ref 0.00–0.07)
Basophils Absolute: 0 10*3/uL (ref 0.0–0.1)
Basophils Relative: 0 %
Eosinophils Absolute: 0 10*3/uL (ref 0.0–0.5)
Eosinophils Relative: 0 %
HCT: 29.7 % — ABNORMAL LOW (ref 36.0–46.0)
Hemoglobin: 9.2 g/dL — ABNORMAL LOW (ref 12.0–15.0)
Immature Granulocytes: 3 %
Lymphocytes Relative: 18 %
Lymphs Abs: 1.7 10*3/uL (ref 0.7–4.0)
MCH: 28.4 pg (ref 26.0–34.0)
MCHC: 31 g/dL (ref 30.0–36.0)
MCV: 91.7 fL (ref 80.0–100.0)
Monocytes Absolute: 1.8 10*3/uL — ABNORMAL HIGH (ref 0.1–1.0)
Monocytes Relative: 20 %
Neutro Abs: 5.4 10*3/uL (ref 1.7–7.7)
Neutrophils Relative %: 59 %
Platelets: 152 10*3/uL (ref 150–400)
RBC: 3.24 MIL/uL — ABNORMAL LOW (ref 3.87–5.11)
RDW: 20.7 % — ABNORMAL HIGH (ref 11.5–15.5)
WBC: 9.1 10*3/uL (ref 4.0–10.5)
nRBC: 0.2 % (ref 0.0–0.2)

## 2022-02-20 LAB — HEMOGLOBIN: Hemoglobin: 9.7 g/dL — ABNORMAL LOW (ref 12.0–15.0)

## 2022-02-20 LAB — FERRITIN: Ferritin: 1164 ng/mL — ABNORMAL HIGH (ref 11–307)

## 2022-02-20 LAB — IRON AND TIBC
Iron: 183 ug/dL — ABNORMAL HIGH (ref 28–170)
Saturation Ratios: 66 % — ABNORMAL HIGH (ref 10.4–31.8)
TIBC: 277 ug/dL (ref 250–450)
UIBC: 94 ug/dL

## 2022-02-20 LAB — PREPARE RBC (CROSSMATCH)

## 2022-02-20 NOTE — Discharge Summary (Signed)
[name] ENI:[id number] DOB: Oct 11, 1923 DOA: 02/19/2022 ? ?PCP: Julia Reid, PA-C ? ?Admit date: 02/19/2022 ?Discharge date: 02/20/2022 ? ?Time spent: 45 minutes ? ?Recommendations for Outpatient Follow-up:  ?Nephrology f/u 1-2 weeks, will need re-check of anemia status  ? ? ? ?Discharge Diagnoses:  ?Active Problems: ?  Anemia associated with chronic renal failure ?  Choledocholithiasis with chronic cholecystitis- not amenable to surgery due to age ?  Low serum thyroid stimulating hormone (TSH) ?  CKD (chronic kidney disease), stage III ([place]) ? ? ?Discharge Condition: stable ? ?Diet recommendation: regular ? ?There were no vitals filed for this visit. ? ?History of present illness:  ?From hpi ?Julia Kerr is a 88 y.o. female with medical history significant of anemia from ckd comes  in sent for nephrology office. For abnormal bloodwork.  Patient was seen by Julia Kerr nephrology who recommended coming to the emergency room and transfusion.  Patient does not have any complaints does not report any chest pains headaches blurred vision vomiting.  Patient is Spanish-speaking 88 year old she seldom takes any medications. ? ?Hospital Course:  ?Hx ckd3b, recent admission for non-operative pelvic fractures. History anemia of ckd, had outpt heme evaluation last year and was started on epo. Family says missed one epo treatment recently 2/2 the hospitalization for pelvic fractures. Saw her nephrologist for f/u a couple of days ago, hgb there resulted as 5.7 from more recently the 7s. Here repeat hgb 5.4. patient denies melena or other bleeding and here here labs (ferritin, iron studies) are not consistent w/ blood loss. She was transfused 2 units and her hemoglobin improved to the 9s, and repeat hgb was also in the 9s. She is feeling well. Plan will be discharge with close nephrology f/u, already plan in place to resume epo. ? ?Procedures: ?none  ? ?Consultations: ?none ? ?Discharge Exam: ?Vitals:  ? 02/20/22 1045 02/20/22  1227  ?BP: (!) 138/51 134/85  ?Pulse: 72 89  ?Resp: 16 18  ?Temp:  98.2 ?F (36.8 ?C)  ?SpO2: 100% 100%  ? ? ?General: NAD ?Cardiovascular: RRR, soft systolic murmur ?Respiratory: CTAB ?Abdomen: soft, non-tender ? ?Discharge Instructions ? ? ?Discharge Instructions   ? ? Diet general   Complete by: As directed ?  ? Increase activity slowly   Complete by: As directed ?  ? ?  ? ?Allergies as of 02/20/2022   ? ?   Reactions  ? Food Other (See Comments)  ? Apples and strawberries cause burning of Kerr  ? ?  ? ?  ?Medication List  ?  ? ?STOP taking these medications   ? ?multivitamin with minerals Tabs tablet ?  ?polyethylene glycol 17 g packet ?Commonly known as: MIRALAX / GLYCOLAX ?  ? ?  ? ?TAKE these medications   ? ?acetaminophen 500 MG tablet ?Commonly known as: TYLENOL ?Take 500 mg by mouth every 6 (six) hours as needed for mild pain. ?  ?VISINE DRY EYE RELIEF OP ?Place 1 drop into both eyes as needed (dry eyes). ?  ? ?  ? ?Allergies  ?Allergen Reactions  ? Food Other (See Comments)  ?  Apples and strawberries cause burning of Kerr  ? ? Follow-up Information   ? ? Julia Lips, MD Follow up.   ?Specialty: Nephrology ?Why: As scheduled ?Contact information: ?[address] ?[place] [address] ?[phone] ? ? ?  ?  ? ?  ?  ? ?  ? ? ? ?The results of significant diagnostics from this hospitalization (including imaging, microbiology, ancillary and laboratory)  are listed below for reference.   ? ?Significant Diagnostic Studies: ?No results found. ? ?Microbiology: ?Recent Results (from the past 240 hour(s))  ?Resp Panel by RT-PCR (Flu A&B, Covid) Nasopharyngeal Swab     Status: None  ? Collection Time: 02/19/22  9:01 PM  ? Specimen: Nasopharyngeal Swab; Nasopharyngeal(NP) swabs in vial transport medium  ?Result Value Ref Range Status  ? SARS Coronavirus 2 by RT PCR NEGATIVE NEGATIVE Final  ?  Comment: (NOTE) ?SARS-CoV-2 target nucleic acids are NOT DETECTED. ? ?The SARS-CoV-2 RNA is generally detectable in upper  respiratory ?specimens during the acute phase of infection. The lowest ?concentration of SARS-CoV-2 viral copies this assay can detect is ?138 copies/mL. A negative result does not preclude SARS-Cov-2 ?infection and should not be used as the sole basis for treatment or ?other patient management decisions. A negative result may occur with  ?improper specimen collection/handling, submission of specimen other ?than nasopharyngeal swab, presence of viral mutation(s) within the ?areas targeted by this assay, and inadequate number of viral ?copies(<138 copies/mL). A negative result must be combined with ?clinical observations, patient history, and epidemiological ?information. The expected result is Negative. ? ?Fact Sheet for Patients:  ?EntrepreneurPulse.com.au ? ?Fact Sheet for Healthcare Providers:  ?IncredibleEmployment.be ? ?This test is no t yet approved or cleared by the [place] FDA and  ?has been authorized for detection and/or diagnosis of SARS-CoV-2 by ?FDA under an Emergency Use Authorization (EUA). This EUA will remain  ?in effect (meaning this test can be used) for the duration of the ?COVID-19 declaration under Section 564(b)(1) of the Act, 21 ?U.S.C.section 360bbb-3(b)(1), unless the authorization is terminated  ?or revoked sooner.  ? ? ?  ? Influenza A by PCR NEGATIVE NEGATIVE Final  ? Influenza B by PCR NEGATIVE NEGATIVE Final  ?  Comment: (NOTE) ?The Xpert Xpress SARS-CoV-2/FLU/RSV plus assay is intended as an aid ?in the diagnosis of influenza from Nasopharyngeal swab specimens and ?should not be used as a sole basis for treatment. Nasal washings and ?aspirates are unacceptable for Xpert Xpress SARS-CoV-2/FLU/RSV ?testing. ? ?Fact Sheet for Patients: ?EntrepreneurPulse.com.au ? ?Fact Sheet for Healthcare Providers: ?IncredibleEmployment.be ? ?This test is not yet approved or cleared by the [place] FDA and ?has been  authorized for detection and/or diagnosis of SARS-CoV-2 by ?FDA under an Emergency Use Authorization (EUA). This EUA will remain ?in effect (meaning this test can be used) for the duration of the ?COVID-19 declaration under Section 564(b)(1) of the Act, 21 U.S.C. ?section 360bbb-3(b)(1), unless the authorization is terminated or ?revoked. ? ?Performed at [facility], [place], ?[place] [address] ?  ?  ? ?Labs: ?Basic Metabolic Panel: ?Recent Labs  ?Lab 02/19/22 ?1926  ?NA 140  ?K 4.4  ?CL 109  ?CO2 21*  ?GLUCOSE 146*  ?BUN 40*  ?CREATININE 1.51*  ?CALCIUM 8.6*  ? ?Liver Function Tests: ?Recent Labs  ?Lab 02/19/22 ?1926  ?AST 22  ?ALT 15  ?ALKPHOS 123  ?BILITOT 0.9  ?PROT 6.7  ?ALBUMIN 3.8  ? ?No results for input(s): LIPASE, AMYLASE in the last 168 hours. ?No results for input(s): AMMONIA in the last 168 hours. ?CBC: ?Recent Labs  ?Lab 02/19/22 ?1926 02/20/22 ?0715 02/20/22 ?0945  ?WBC 9.0 9.1  --   ?NEUTROABS  --  5.4  --   ?HGB 5.4* 9.2* 9.7*  ?HCT 19.0* 29.7*  --   ?MCV 99.0 91.7  --   ?PLT 168 152  --   ? ?Cardiac Enzymes: ?No results for input(s):  CKTOTAL, CKMB, CKMBINDEX, TROPONINI in the last 168 hours. ?BNP: ?BNP (last 3 results) ?No results for input(s): BNP in the last 8760 hours. ? ?ProBNP (last 3 results) ?No results for input(s): PROBNP in the last 8760 hours. ? ?CBG: ?No results for input(s): GLUCAP in the last 168 hours. ? ? ? ? ?Signed: ? ?Desma Maxim MD.  ?[facility] ?02/20/2022, 3:52 PM ? ?

## 2022-02-20 NOTE — TOC Transition Note (Signed)
Transition of Care (TOC) - CM/SW Discharge Note ? ? ?Patient Details  ?Name: Julia Kerr ?MRN: [id number] ?Date of Birth: 02-26-1923 ? ?Transition of Care (TOC) CM/SW Contact:  ?Izola Price, RN ?Phone Number: ?02/20/2022, 4:26 PM ? ? ?Clinical Narrative:  3/4: OBS status with discharge orders to home/self care. Simmie Davies RN CM   ? ? ? ?Final next level of care: Home/Self Care ?Barriers to Discharge: Barriers Resolved ? ? ?Patient Goals and CMS Choice ?  ?  ?Choice offered to / list presented to : NA ? ?Discharge Placement ?  ?           ?  ?  ?  ?  ? ?Discharge Plan and Services ?  ?  ?           ?DME Arranged: N/A ?DME Agency: NA ?  ?  ?  ?HH Arranged: NA ?[place] Agency: NA ?  ?  ?  ? ?Social Determinants of Health (SDOH) Interventions ?  ? ? ?Readmission Risk Interventions ?No flowsheet data found. ? ? ? ? ?

## 2022-02-20 NOTE — ED Notes (Signed)
Family at bedside. Explained to pt how to urinate into purewick. Pt urinated and purewick is working. ?

## 2022-02-20 NOTE — ED Notes (Signed)
Pt eating food that family brought. MD saw pt at bedside. ?

## 2022-02-20 NOTE — ED Notes (Signed)
Sent msg to inquire whether pt can now be able to eat and drink.  ?

## 2022-02-20 NOTE — ED Notes (Signed)
Pt resting in bed comfortably. Family has left for now. Offered blanket and water, pt declines.  ?

## 2022-02-20 NOTE — ED Notes (Signed)
[employer] unable to print labels, "locked". Will send CBC with chart label. ?

## 2022-02-20 NOTE — ED Notes (Signed)
Reviewed Blood consent with patient using Language line with spanish interpreter. Obtained consent signed by patient. ?

## 2022-02-20 NOTE — ED Notes (Signed)
Informed RN bed assigned 

## 2022-02-22 DIAGNOSIS — J302 Other seasonal allergic rhinitis: Secondary | ICD-10-CM | POA: Diagnosis not present

## 2022-02-22 DIAGNOSIS — N179 Acute kidney failure, unspecified: Secondary | ICD-10-CM | POA: Diagnosis not present

## 2022-02-22 DIAGNOSIS — E559 Vitamin D deficiency, unspecified: Secondary | ICD-10-CM | POA: Diagnosis not present

## 2022-02-22 DIAGNOSIS — K8044 Calculus of bile duct with chronic cholecystitis without obstruction: Secondary | ICD-10-CM | POA: Diagnosis not present

## 2022-02-22 DIAGNOSIS — E860 Dehydration: Secondary | ICD-10-CM | POA: Diagnosis not present

## 2022-02-22 DIAGNOSIS — M5432 Sciatica, left side: Secondary | ICD-10-CM | POA: Diagnosis not present

## 2022-02-22 DIAGNOSIS — D62 Acute posthemorrhagic anemia: Secondary | ICD-10-CM | POA: Diagnosis not present

## 2022-02-22 DIAGNOSIS — J310 Chronic rhinitis: Secondary | ICD-10-CM | POA: Diagnosis not present

## 2022-02-22 DIAGNOSIS — D631 Anemia in chronic kidney disease: Secondary | ICD-10-CM | POA: Diagnosis not present

## 2022-02-22 DIAGNOSIS — Z9181 History of falling: Secondary | ICD-10-CM | POA: Diagnosis not present

## 2022-02-22 DIAGNOSIS — K5900 Constipation, unspecified: Secondary | ICD-10-CM | POA: Diagnosis not present

## 2022-02-22 DIAGNOSIS — M800AXD Age-related osteoporosis with current pathological fracture, other site, subsequent encounter for fracture with routine healing: Secondary | ICD-10-CM | POA: Diagnosis not present

## 2022-02-22 DIAGNOSIS — E538 Deficiency of other specified B group vitamins: Secondary | ICD-10-CM | POA: Diagnosis not present

## 2022-02-22 DIAGNOSIS — N1832 Chronic kidney disease, stage 3b: Secondary | ICD-10-CM | POA: Diagnosis not present

## 2022-02-22 DIAGNOSIS — G8929 Other chronic pain: Secondary | ICD-10-CM | POA: Diagnosis not present

## 2022-02-22 DIAGNOSIS — E43 Unspecified severe protein-calorie malnutrition: Secondary | ICD-10-CM | POA: Diagnosis not present

## 2022-02-23 LAB — TYPE AND SCREEN
ABO/RH(D): A POS
Antibody Screen: NEGATIVE
Unit division: 0
Unit division: 0

## 2022-02-23 LAB — BPAM RBC
Blood Product Expiration Date: 202303222359
Blood Product Expiration Date: 202303222359
ISSUE DATE / TIME: 202303040100
ISSUE DATE / TIME: 202303040425
Unit Type and Rh: 6200
Unit Type and Rh: 6200

## 2022-02-24 DIAGNOSIS — E43 Unspecified severe protein-calorie malnutrition: Secondary | ICD-10-CM | POA: Diagnosis not present

## 2022-02-24 DIAGNOSIS — N179 Acute kidney failure, unspecified: Secondary | ICD-10-CM | POA: Diagnosis not present

## 2022-02-24 DIAGNOSIS — M5432 Sciatica, left side: Secondary | ICD-10-CM | POA: Diagnosis not present

## 2022-02-24 DIAGNOSIS — D631 Anemia in chronic kidney disease: Secondary | ICD-10-CM | POA: Diagnosis not present

## 2022-02-24 DIAGNOSIS — E538 Deficiency of other specified B group vitamins: Secondary | ICD-10-CM | POA: Diagnosis not present

## 2022-02-24 DIAGNOSIS — N1832 Chronic kidney disease, stage 3b: Secondary | ICD-10-CM | POA: Diagnosis not present

## 2022-02-24 DIAGNOSIS — E559 Vitamin D deficiency, unspecified: Secondary | ICD-10-CM | POA: Diagnosis not present

## 2022-02-24 DIAGNOSIS — G8929 Other chronic pain: Secondary | ICD-10-CM | POA: Diagnosis not present

## 2022-02-24 DIAGNOSIS — M800AXD Age-related osteoporosis with current pathological fracture, other site, subsequent encounter for fracture with routine healing: Secondary | ICD-10-CM | POA: Diagnosis not present

## 2022-02-24 DIAGNOSIS — K8044 Calculus of bile duct with chronic cholecystitis without obstruction: Secondary | ICD-10-CM | POA: Diagnosis not present

## 2022-02-24 DIAGNOSIS — J302 Other seasonal allergic rhinitis: Secondary | ICD-10-CM | POA: Diagnosis not present

## 2022-02-24 DIAGNOSIS — E860 Dehydration: Secondary | ICD-10-CM | POA: Diagnosis not present

## 2022-02-24 DIAGNOSIS — J310 Chronic rhinitis: Secondary | ICD-10-CM | POA: Diagnosis not present

## 2022-02-24 DIAGNOSIS — K5900 Constipation, unspecified: Secondary | ICD-10-CM | POA: Diagnosis not present

## 2022-02-24 DIAGNOSIS — Z9181 History of falling: Secondary | ICD-10-CM | POA: Diagnosis not present

## 2022-02-24 DIAGNOSIS — D62 Acute posthemorrhagic anemia: Secondary | ICD-10-CM | POA: Diagnosis not present

## 2022-02-26 DIAGNOSIS — K8044 Calculus of bile duct with chronic cholecystitis without obstruction: Secondary | ICD-10-CM | POA: Diagnosis not present

## 2022-02-26 DIAGNOSIS — G8929 Other chronic pain: Secondary | ICD-10-CM | POA: Diagnosis not present

## 2022-02-26 DIAGNOSIS — N1832 Chronic kidney disease, stage 3b: Secondary | ICD-10-CM | POA: Diagnosis not present

## 2022-02-26 DIAGNOSIS — J310 Chronic rhinitis: Secondary | ICD-10-CM | POA: Diagnosis not present

## 2022-02-26 DIAGNOSIS — M800AXD Age-related osteoporosis with current pathological fracture, other site, subsequent encounter for fracture with routine healing: Secondary | ICD-10-CM | POA: Diagnosis not present

## 2022-02-26 DIAGNOSIS — E43 Unspecified severe protein-calorie malnutrition: Secondary | ICD-10-CM | POA: Diagnosis not present

## 2022-02-26 DIAGNOSIS — M5432 Sciatica, left side: Secondary | ICD-10-CM | POA: Diagnosis not present

## 2022-02-26 DIAGNOSIS — K5900 Constipation, unspecified: Secondary | ICD-10-CM | POA: Diagnosis not present

## 2022-02-26 DIAGNOSIS — D62 Acute posthemorrhagic anemia: Secondary | ICD-10-CM | POA: Diagnosis not present

## 2022-02-26 DIAGNOSIS — E538 Deficiency of other specified B group vitamins: Secondary | ICD-10-CM | POA: Diagnosis not present

## 2022-02-26 DIAGNOSIS — J302 Other seasonal allergic rhinitis: Secondary | ICD-10-CM | POA: Diagnosis not present

## 2022-02-26 DIAGNOSIS — E860 Dehydration: Secondary | ICD-10-CM | POA: Diagnosis not present

## 2022-02-26 DIAGNOSIS — N179 Acute kidney failure, unspecified: Secondary | ICD-10-CM | POA: Diagnosis not present

## 2022-02-26 DIAGNOSIS — Z9181 History of falling: Secondary | ICD-10-CM | POA: Diagnosis not present

## 2022-02-26 DIAGNOSIS — E559 Vitamin D deficiency, unspecified: Secondary | ICD-10-CM | POA: Diagnosis not present

## 2022-02-26 DIAGNOSIS — D631 Anemia in chronic kidney disease: Secondary | ICD-10-CM | POA: Diagnosis not present

## 2022-02-27 IMAGING — CT CT HIP*L* W/O CM
2 of 4 series · 16 of 46 positions shown, 18 images · non-contrast
Comparison: Same-day x-ray

CLINICAL DATA: Left hip pain.  Left pubic rami fractures

EXAM:
CT OF THE LEFT HIP WITHOUT CONTRAST
TECHNIQUE: Multidetector CT imaging of the left hip was performed according to
the standard protocol. Multiplanar CT image reconstructions were
also generated.
RADIATION DOSE REDUCTION: This exam was performed according to the
departmental dose-optimization program which includes automated
exposure control, adjustment of the mA and/or kV according to
patient size and/or use of iterative reconstruction technique.

[Series 8: coronal st · coronal · 0.47mm/px · 3 of 95 slices shown]
[im 32/95  soft-tissue]
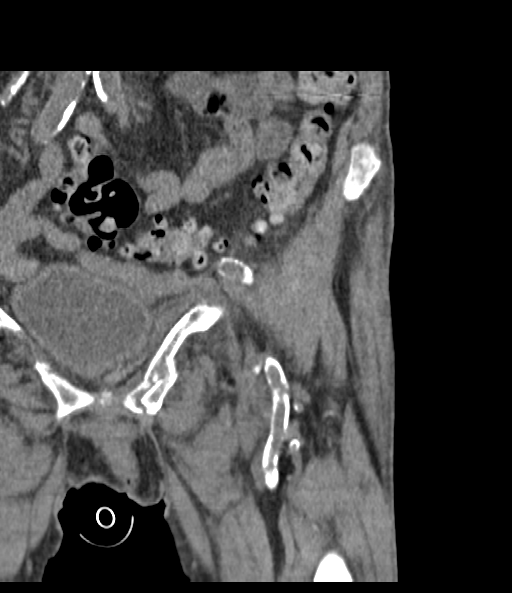
[im 42/95  soft-tissue]
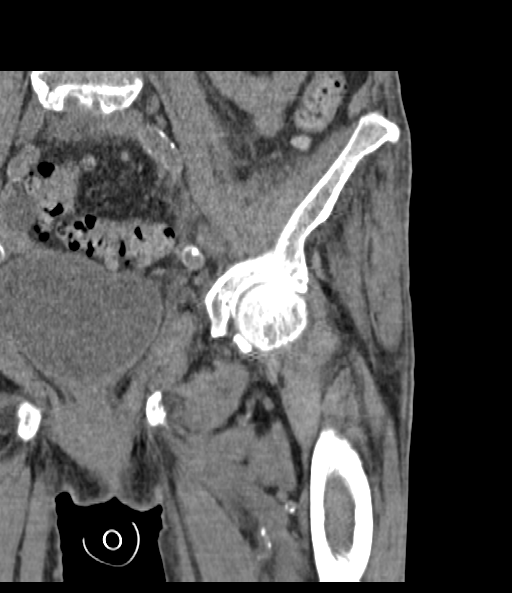
[im 53/95  soft-tissue]
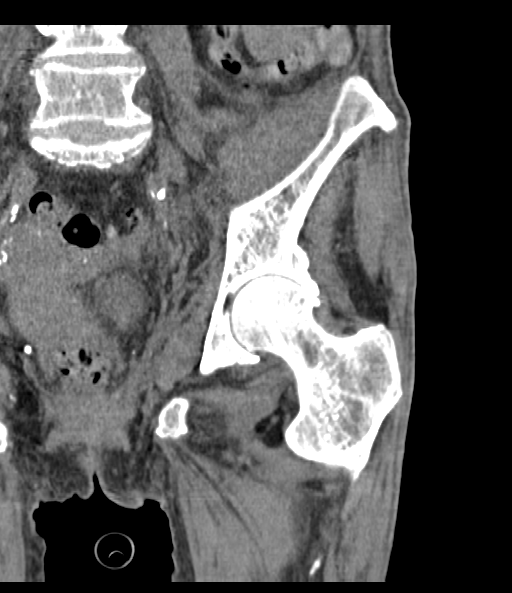

[Series 10: pelvis thin · axial · 0.48mm/px · z∈[+605,+832]mm · 13 of 414 slices shown, 15 images]
[im 18/414  soft-tissue]
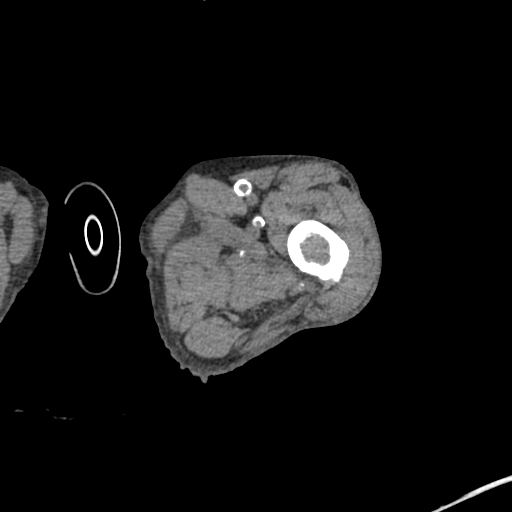
[im 18/414  bone]
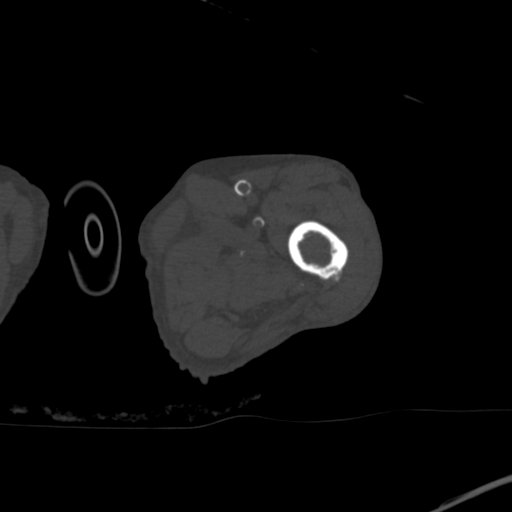
[im 54/414  soft-tissue]
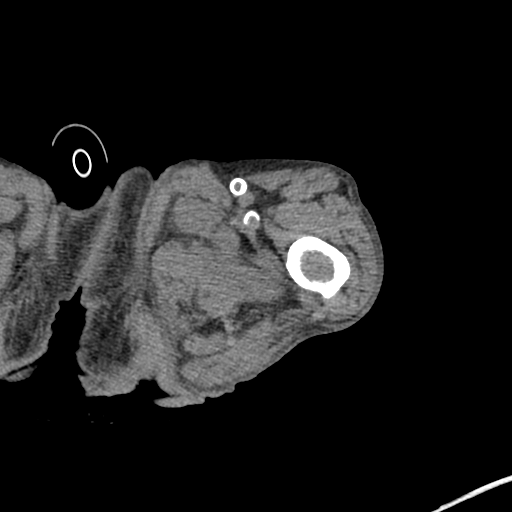
[im 90/414  soft-tissue]
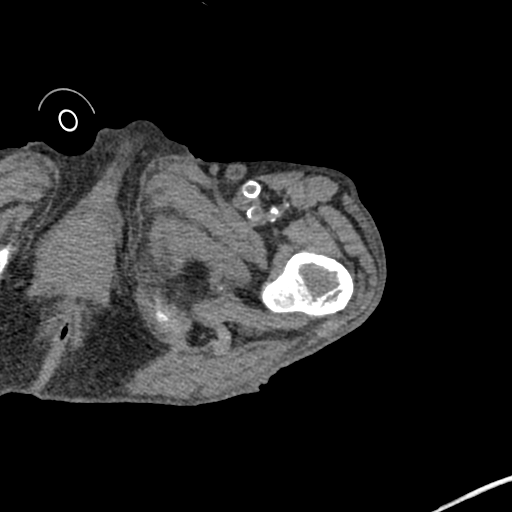
[im 108/414  soft-tissue]
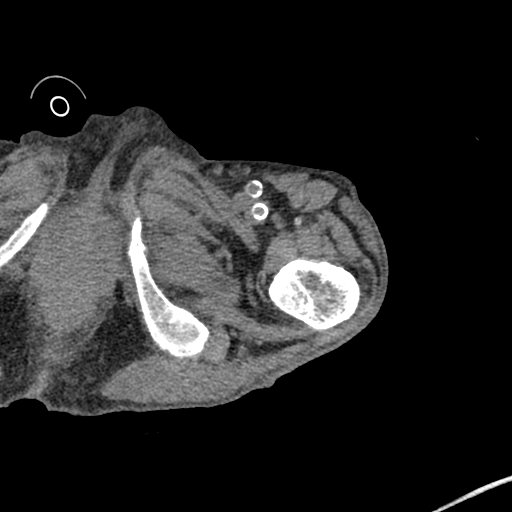
[im 144/414  soft-tissue]
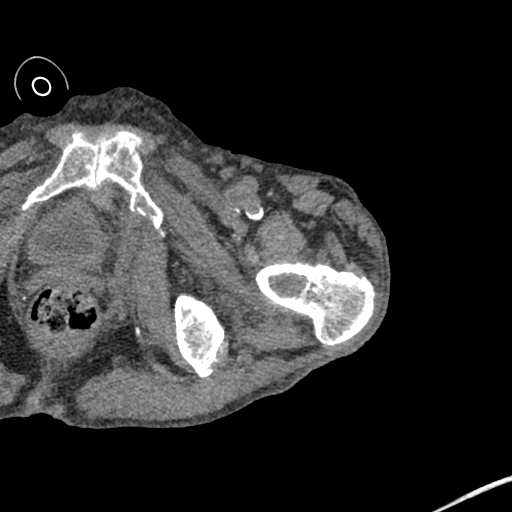
[im 180/414  soft-tissue]
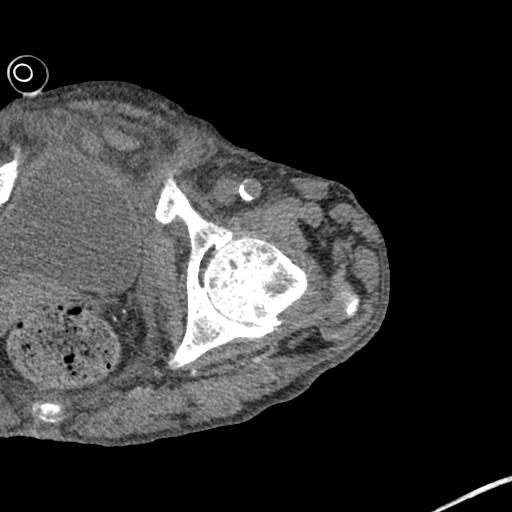
[im 216/414  soft-tissue]
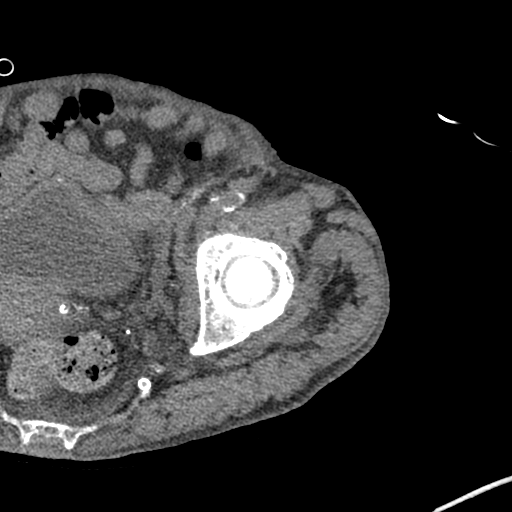
[im 234/414  soft-tissue]
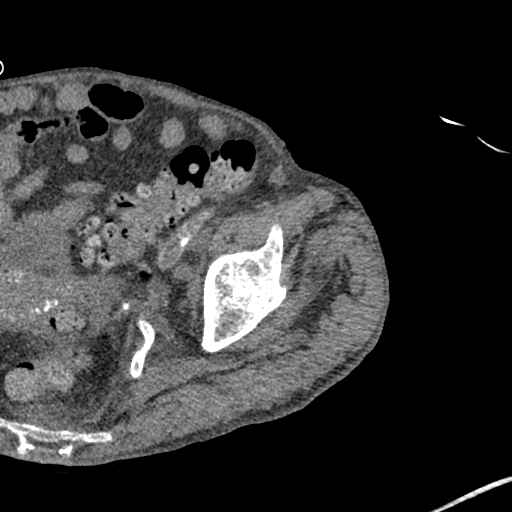
[im 270/414  soft-tissue]
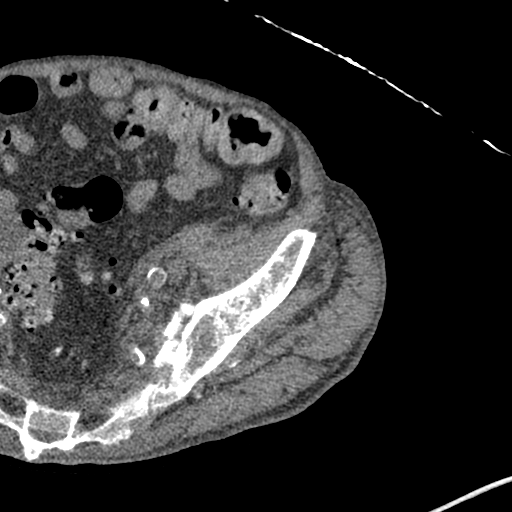
[im 270/414  bone]
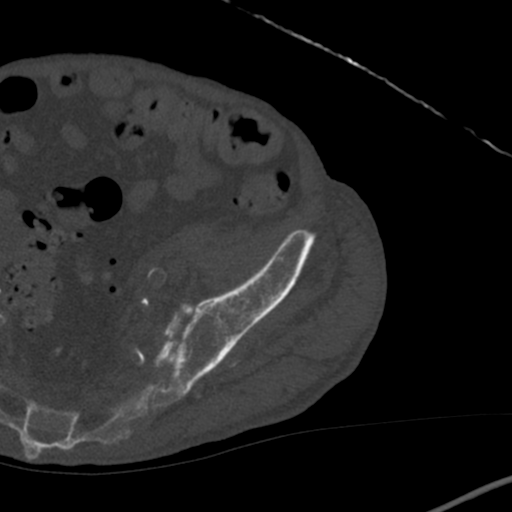
[im 306/414  soft-tissue]
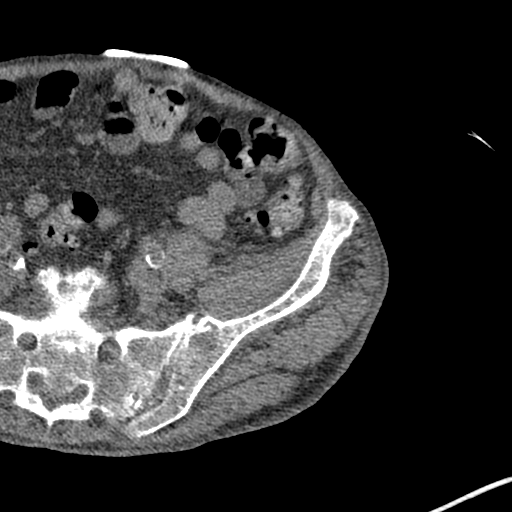
[im 324/414  soft-tissue]
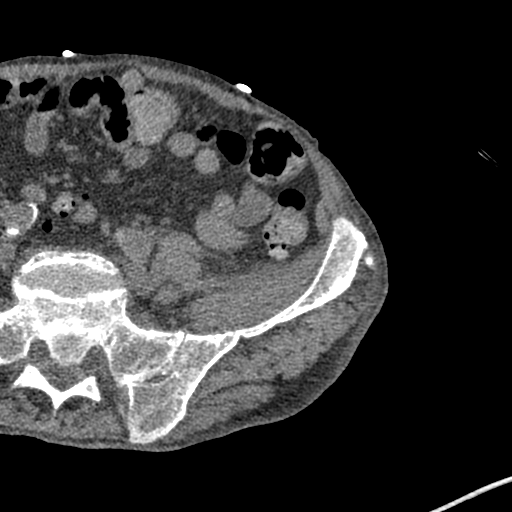
[im 360/414  soft-tissue]
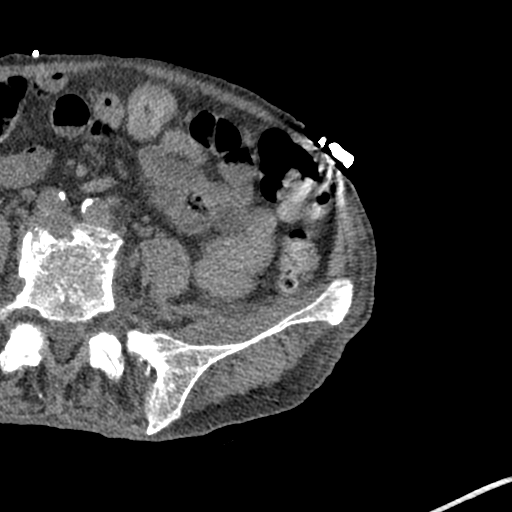
[im 396/414  soft-tissue]
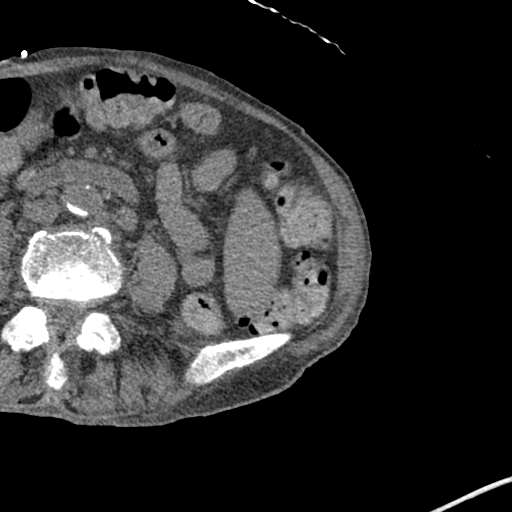

[16 of 46 positions shown; findings below may reference images not displayed]

FINDINGS: Bones/Joint/Cartilage

Multiple acute fractures of the left hemipelvis. Comminuted
moderately displaced fractures of the left superior pubic ramus and
parasymphyseal left pubic bone. Mildly displaced left inferior pubic
ramus fracture. Acute essentially nondisplaced fractures along the
inferior margin of the left sacroiliac joint involving both the
sacrum and ilium (series 6, images 57-66). Left SI joint is
partially ankylosed along its superior margin. No left SI joint or
pubic symphysis diastasis. Left proximal femur intact without
fracture. Left hip joint intact without fracture or dislocation. No
acetabular fracture. Bones are diffusely demineralized.

Ligaments

Suboptimally assessed by CT.

Muscles and Tendons

No acute musculotendinous abnormality by CT.

Soft tissues

Small amount of ill-defined fluid along the left pelvic sidewall
compatible with hematoma. Additional small anterior pelvic hematoma
adjacent to the parasymphyseal fracture site. Extensive
atherosclerotic vascular calcifications. Colonic diverticulosis.
IMPRESSION: 1. Multiple acute fractures of the left hemipelvis including the
left superior and inferior pubic rami as well as the sacrum and
ilium adjacent to the inferior margin of the left SI joint.
2. No left SI joint or pubic symphysis diastasis.
3. Small amount of ill-defined left pelvic sidewall hematoma. Small
anterior pelvic hematoma
4. Left hip intact without fracture or dislocation.

## 2022-02-27 IMAGING — CR DG HIP (WITH OR WITHOUT PELVIS) 2-3V*L*
3 series · 3 of 3 positions shown · non-contrast
Comparison: None.

CLINICAL DATA: Fall left hip pain, limited range of motion.

EXAM:
DG HIP (WITH OR WITHOUT PELVIS) 2-3V LEFT

[pelvis ap]
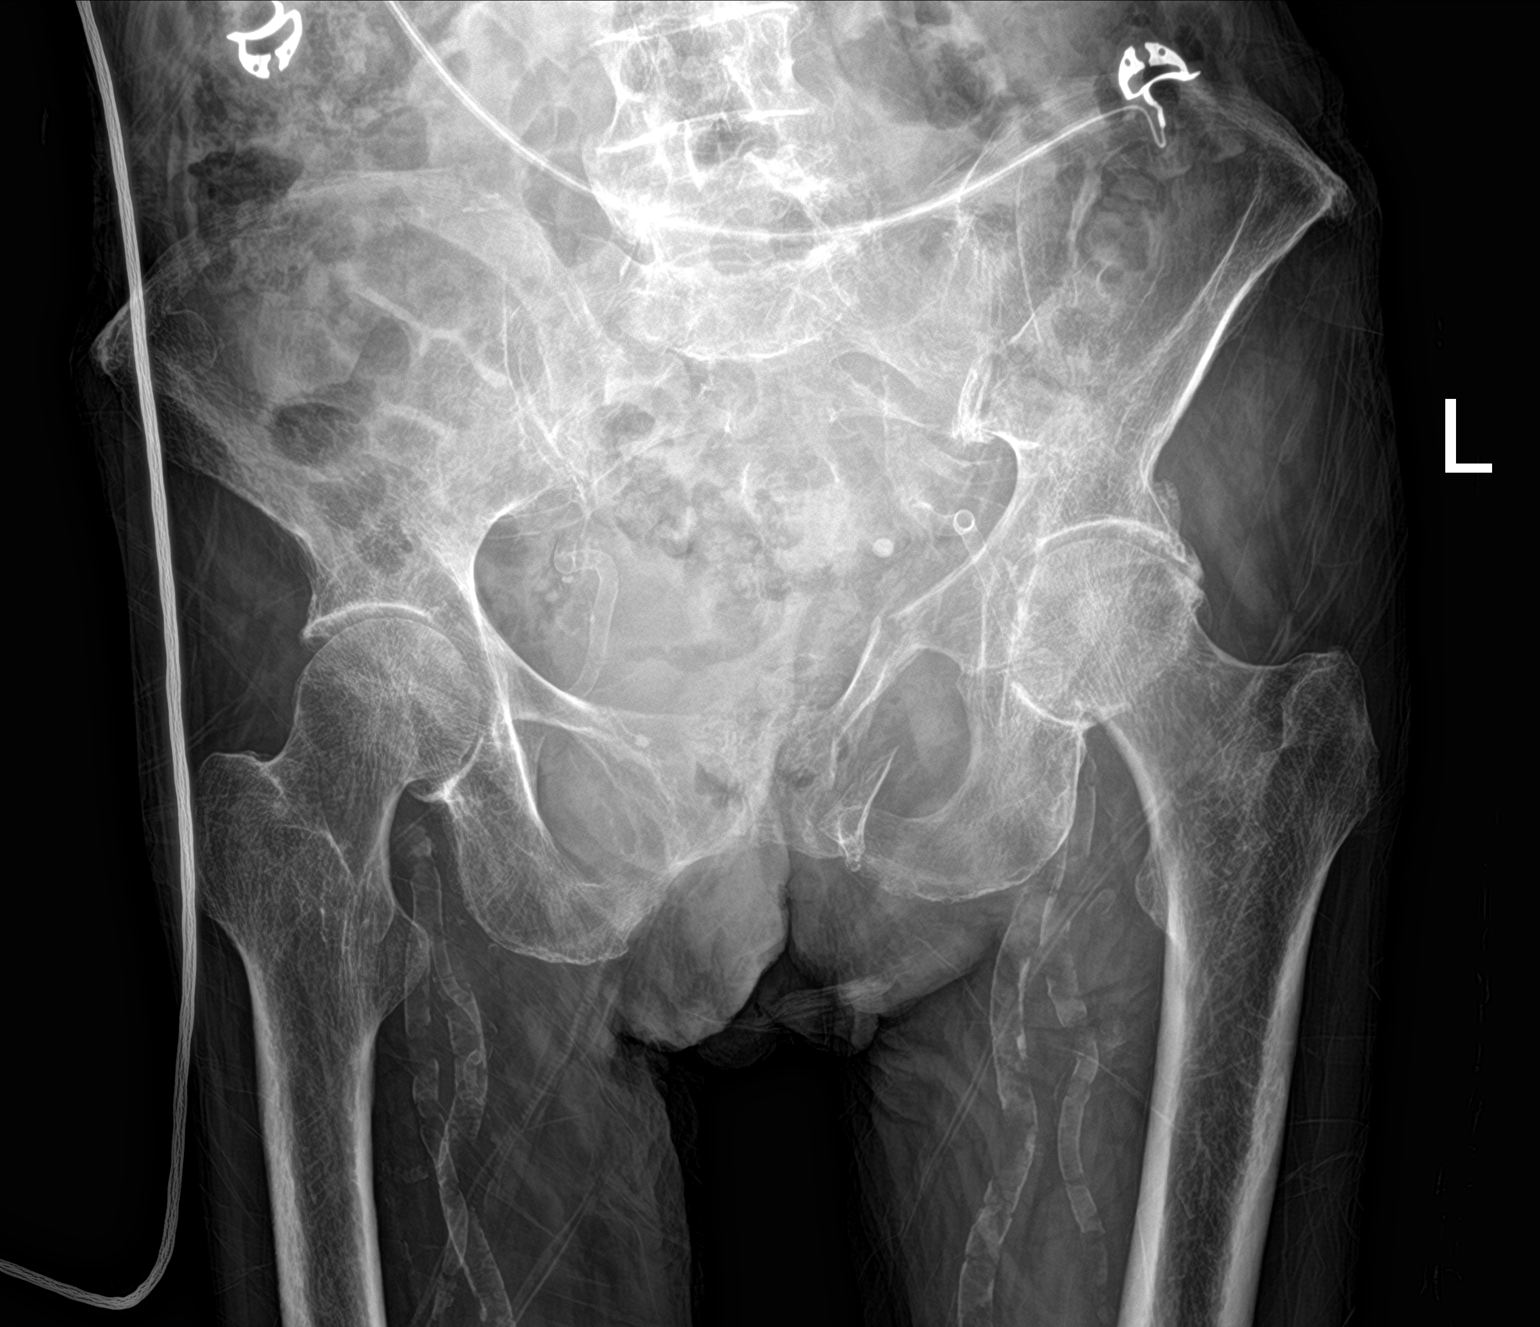

[hip ap]
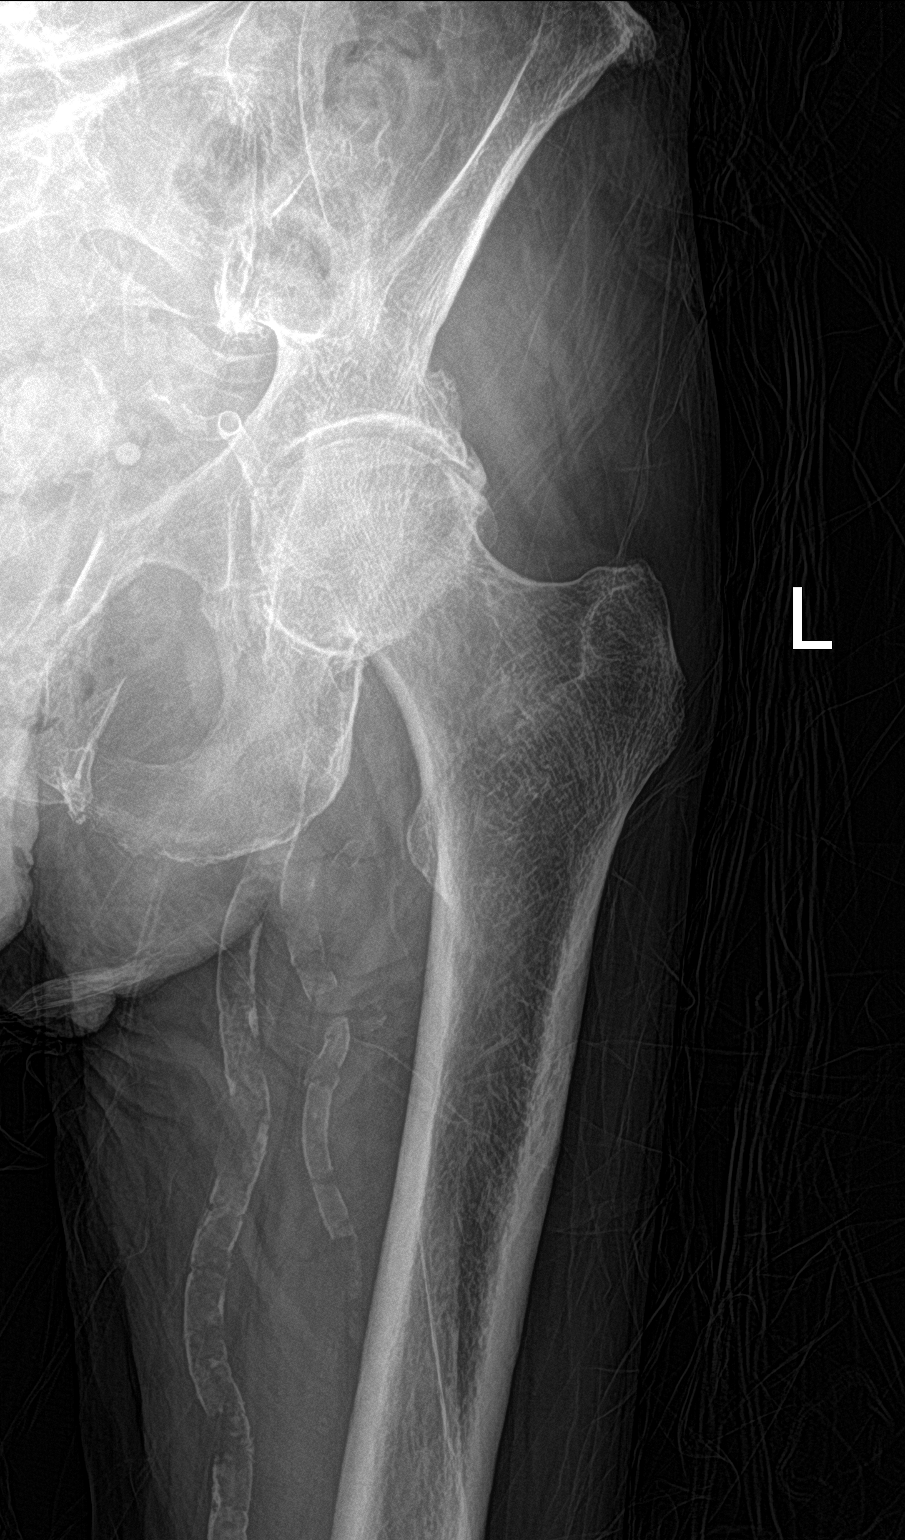

[hip lat]
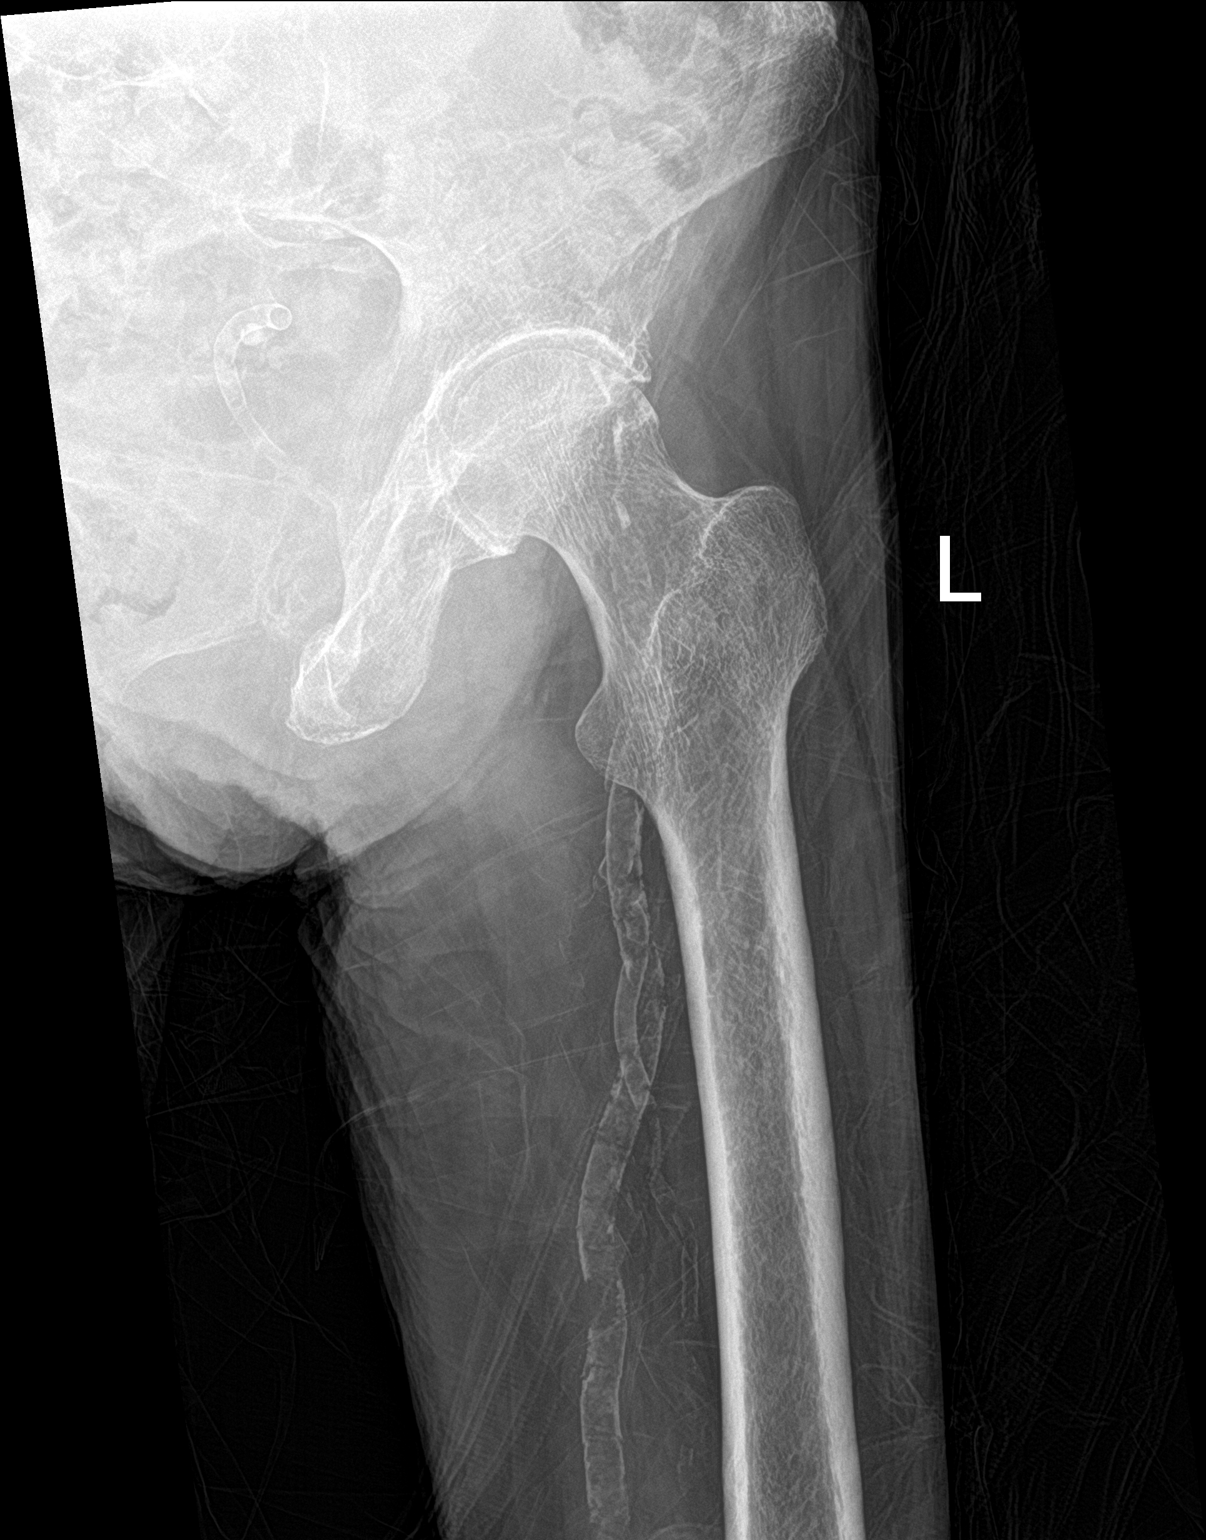

[3 of 3 positions shown; findings below may reference images not displayed]

FINDINGS: Diffuse demineralization of bone. Comminuted displaced fractures of
the left inferior and superior pubic rami. Probable nondisplaced
left acetabular fracture. No visualized proximal femur fracture.
Vascular calcifications.
IMPRESSION: 1. Comminuted displaced fractures of the left inferior and superior
pubic rami.
2. Probable nondisplaced left acetabular fracture

## 2022-02-27 IMAGING — DX DG KNEE 1-2V PORT*L*
1 series · 4 of 4 positions shown · non-contrast
Comparison: No priors.

CLINICAL DATA: [AGE] female with history of fall yesterday
evening. Left knee pain.

EXAM:
PORTABLE LEFT KNEE - 1-2 VIEW

[Series 1: knee · 0.14mm/px · 4 of 4 slices shown]
[im 1/4]
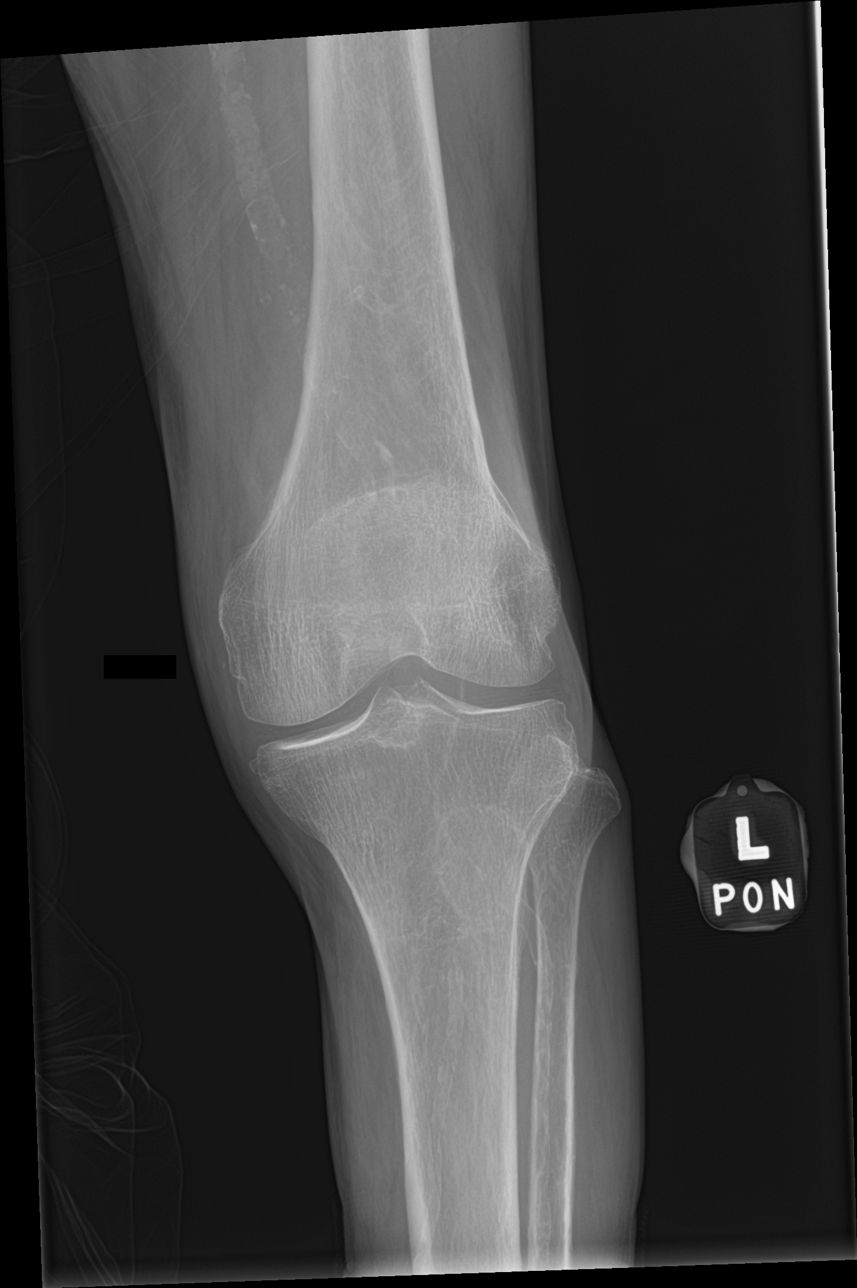
[im 2/4]
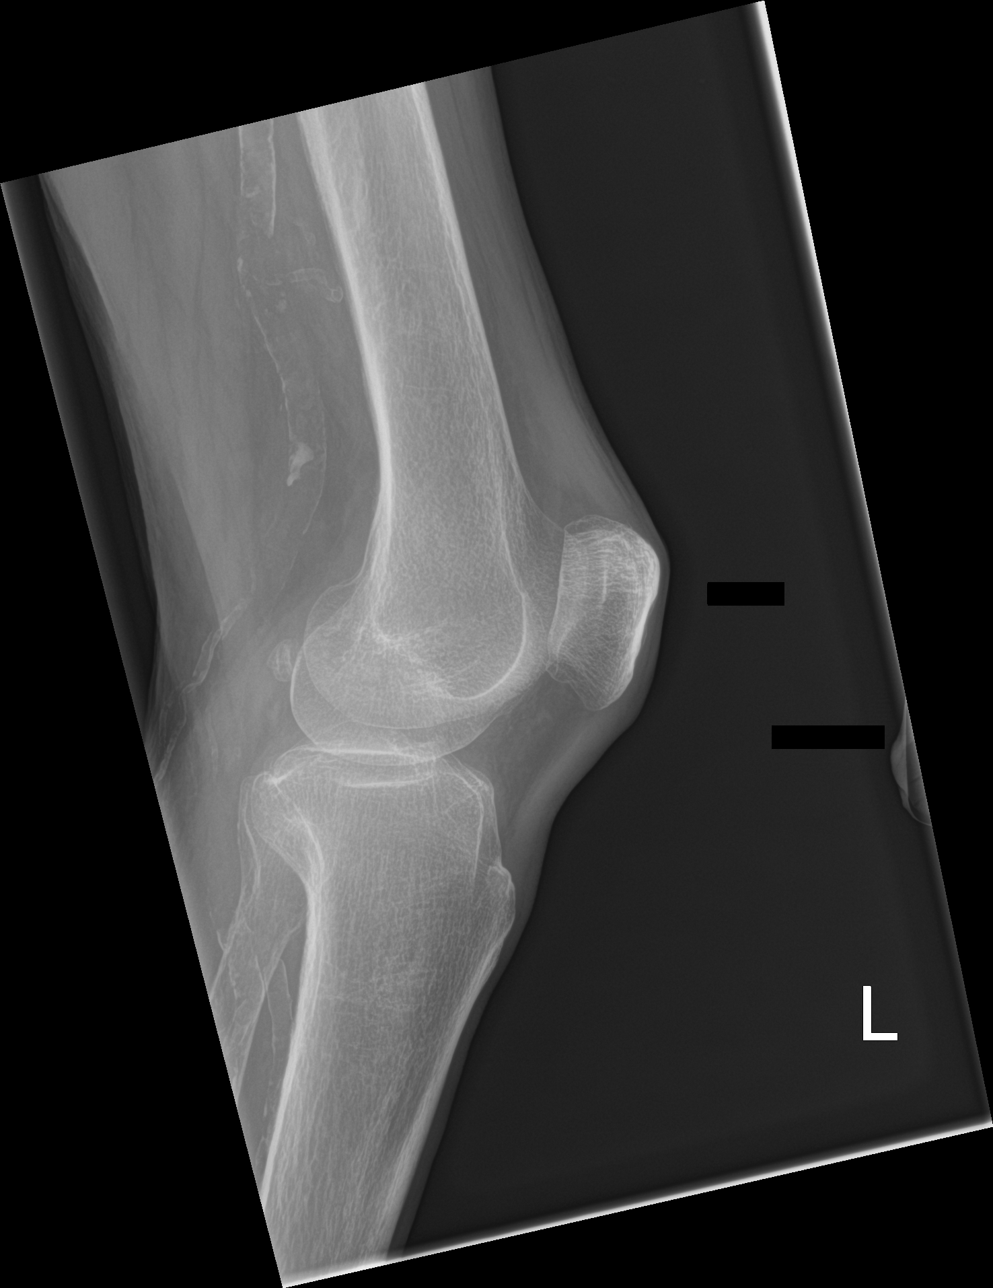
[im 3/4]
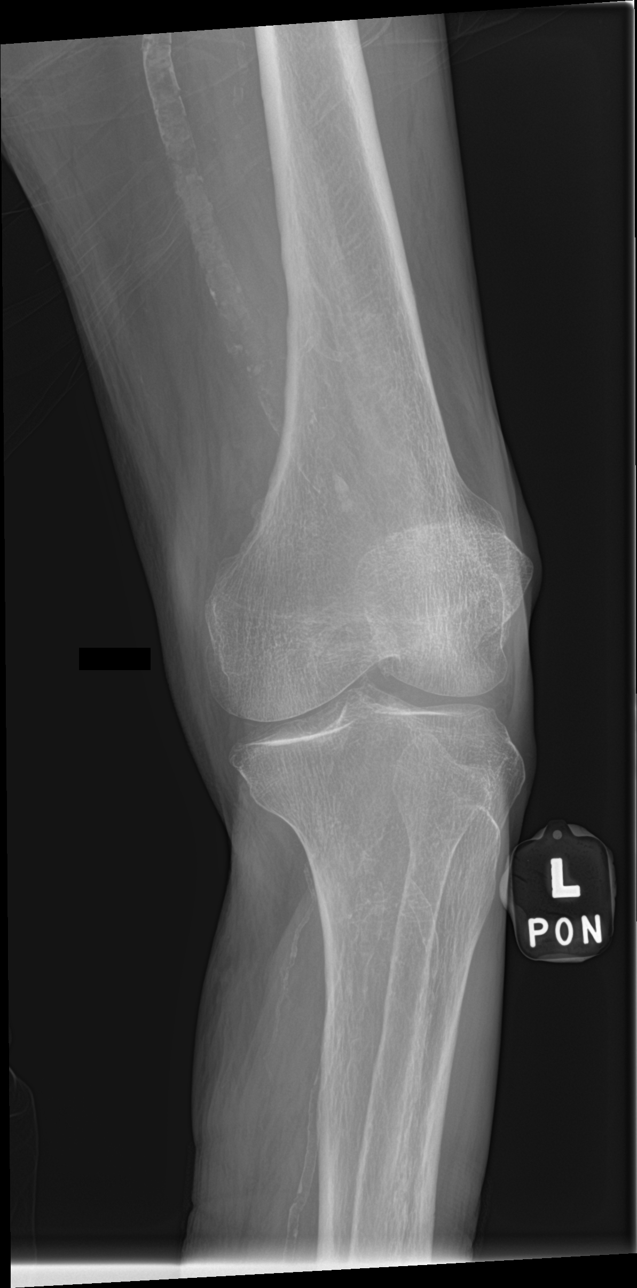
[im 4/4]
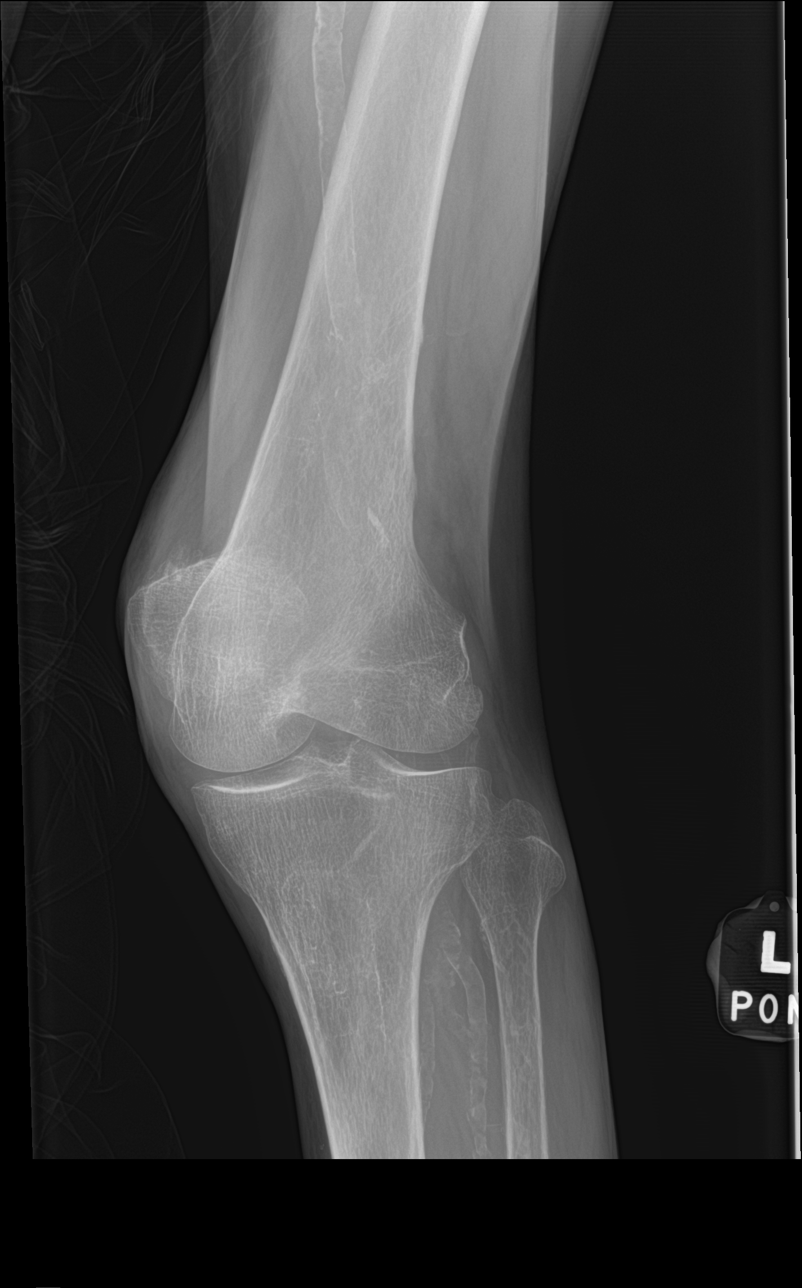

[4 of 4 positions shown; findings below may reference images not displayed]

FINDINGS: No evidence of fracture, dislocation, or joint effusion. No evidence
of arthropathy or other focal bone abnormality. Numerous vascular
calcifications. Soft tissues are unremarkable.
IMPRESSION: 1. No acute radiographic abnormality of the left knee.
2. Atherosclerosis.

## 2022-03-01 DIAGNOSIS — M800AXD Age-related osteoporosis with current pathological fracture, other site, subsequent encounter for fracture with routine healing: Secondary | ICD-10-CM | POA: Diagnosis not present

## 2022-03-01 DIAGNOSIS — E559 Vitamin D deficiency, unspecified: Secondary | ICD-10-CM | POA: Diagnosis not present

## 2022-03-01 DIAGNOSIS — N1832 Chronic kidney disease, stage 3b: Secondary | ICD-10-CM | POA: Diagnosis not present

## 2022-03-01 DIAGNOSIS — G8929 Other chronic pain: Secondary | ICD-10-CM | POA: Diagnosis not present

## 2022-03-01 DIAGNOSIS — Z9181 History of falling: Secondary | ICD-10-CM | POA: Diagnosis not present

## 2022-03-01 DIAGNOSIS — N179 Acute kidney failure, unspecified: Secondary | ICD-10-CM | POA: Diagnosis not present

## 2022-03-01 DIAGNOSIS — J302 Other seasonal allergic rhinitis: Secondary | ICD-10-CM | POA: Diagnosis not present

## 2022-03-01 DIAGNOSIS — E43 Unspecified severe protein-calorie malnutrition: Secondary | ICD-10-CM | POA: Diagnosis not present

## 2022-03-01 DIAGNOSIS — M5432 Sciatica, left side: Secondary | ICD-10-CM | POA: Diagnosis not present

## 2022-03-01 DIAGNOSIS — E860 Dehydration: Secondary | ICD-10-CM | POA: Diagnosis not present

## 2022-03-01 DIAGNOSIS — K5900 Constipation, unspecified: Secondary | ICD-10-CM | POA: Diagnosis not present

## 2022-03-01 DIAGNOSIS — E538 Deficiency of other specified B group vitamins: Secondary | ICD-10-CM | POA: Diagnosis not present

## 2022-03-01 DIAGNOSIS — D631 Anemia in chronic kidney disease: Secondary | ICD-10-CM | POA: Diagnosis not present

## 2022-03-01 DIAGNOSIS — D62 Acute posthemorrhagic anemia: Secondary | ICD-10-CM | POA: Diagnosis not present

## 2022-03-01 DIAGNOSIS — K8044 Calculus of bile duct with chronic cholecystitis without obstruction: Secondary | ICD-10-CM | POA: Diagnosis not present

## 2022-03-01 DIAGNOSIS — J310 Chronic rhinitis: Secondary | ICD-10-CM | POA: Diagnosis not present

## 2022-03-03 DIAGNOSIS — E538 Deficiency of other specified B group vitamins: Secondary | ICD-10-CM | POA: Diagnosis not present

## 2022-03-03 DIAGNOSIS — E559 Vitamin D deficiency, unspecified: Secondary | ICD-10-CM | POA: Diagnosis not present

## 2022-03-03 DIAGNOSIS — M800AXD Age-related osteoporosis with current pathological fracture, other site, subsequent encounter for fracture with routine healing: Secondary | ICD-10-CM | POA: Diagnosis not present

## 2022-03-03 DIAGNOSIS — M5432 Sciatica, left side: Secondary | ICD-10-CM | POA: Diagnosis not present

## 2022-03-03 DIAGNOSIS — E43 Unspecified severe protein-calorie malnutrition: Secondary | ICD-10-CM | POA: Diagnosis not present

## 2022-03-03 DIAGNOSIS — J310 Chronic rhinitis: Secondary | ICD-10-CM | POA: Diagnosis not present

## 2022-03-03 DIAGNOSIS — D631 Anemia in chronic kidney disease: Secondary | ICD-10-CM | POA: Diagnosis not present

## 2022-03-03 DIAGNOSIS — K8044 Calculus of bile duct with chronic cholecystitis without obstruction: Secondary | ICD-10-CM | POA: Diagnosis not present

## 2022-03-03 DIAGNOSIS — Z9181 History of falling: Secondary | ICD-10-CM | POA: Diagnosis not present

## 2022-03-03 DIAGNOSIS — K5900 Constipation, unspecified: Secondary | ICD-10-CM | POA: Diagnosis not present

## 2022-03-03 DIAGNOSIS — N1832 Chronic kidney disease, stage 3b: Secondary | ICD-10-CM | POA: Diagnosis not present

## 2022-03-03 DIAGNOSIS — E860 Dehydration: Secondary | ICD-10-CM | POA: Diagnosis not present

## 2022-03-03 DIAGNOSIS — J302 Other seasonal allergic rhinitis: Secondary | ICD-10-CM | POA: Diagnosis not present

## 2022-03-03 DIAGNOSIS — D62 Acute posthemorrhagic anemia: Secondary | ICD-10-CM | POA: Diagnosis not present

## 2022-03-03 DIAGNOSIS — N179 Acute kidney failure, unspecified: Secondary | ICD-10-CM | POA: Diagnosis not present

## 2022-03-03 DIAGNOSIS — G8929 Other chronic pain: Secondary | ICD-10-CM | POA: Diagnosis not present

## 2022-03-04 DIAGNOSIS — K8044 Calculus of bile duct with chronic cholecystitis without obstruction: Secondary | ICD-10-CM | POA: Diagnosis not present

## 2022-03-04 DIAGNOSIS — D631 Anemia in chronic kidney disease: Secondary | ICD-10-CM | POA: Diagnosis not present

## 2022-03-04 DIAGNOSIS — N1832 Chronic kidney disease, stage 3b: Secondary | ICD-10-CM | POA: Diagnosis not present

## 2022-03-04 DIAGNOSIS — J302 Other seasonal allergic rhinitis: Secondary | ICD-10-CM | POA: Diagnosis not present

## 2022-03-04 DIAGNOSIS — M5432 Sciatica, left side: Secondary | ICD-10-CM | POA: Diagnosis not present

## 2022-03-04 DIAGNOSIS — G8929 Other chronic pain: Secondary | ICD-10-CM | POA: Diagnosis not present

## 2022-03-04 DIAGNOSIS — E538 Deficiency of other specified B group vitamins: Secondary | ICD-10-CM | POA: Diagnosis not present

## 2022-03-04 DIAGNOSIS — J310 Chronic rhinitis: Secondary | ICD-10-CM | POA: Diagnosis not present

## 2022-03-04 DIAGNOSIS — E43 Unspecified severe protein-calorie malnutrition: Secondary | ICD-10-CM | POA: Diagnosis not present

## 2022-03-04 DIAGNOSIS — E559 Vitamin D deficiency, unspecified: Secondary | ICD-10-CM | POA: Diagnosis not present

## 2022-03-04 DIAGNOSIS — N179 Acute kidney failure, unspecified: Secondary | ICD-10-CM | POA: Diagnosis not present

## 2022-03-04 DIAGNOSIS — E860 Dehydration: Secondary | ICD-10-CM | POA: Diagnosis not present

## 2022-03-04 DIAGNOSIS — M800AXD Age-related osteoporosis with current pathological fracture, other site, subsequent encounter for fracture with routine healing: Secondary | ICD-10-CM | POA: Diagnosis not present

## 2022-03-04 DIAGNOSIS — K5900 Constipation, unspecified: Secondary | ICD-10-CM | POA: Diagnosis not present

## 2022-03-04 DIAGNOSIS — Z9181 History of falling: Secondary | ICD-10-CM | POA: Diagnosis not present

## 2022-03-04 DIAGNOSIS — D62 Acute posthemorrhagic anemia: Secondary | ICD-10-CM | POA: Diagnosis not present

## 2022-03-08 DIAGNOSIS — K8044 Calculus of bile duct with chronic cholecystitis without obstruction: Secondary | ICD-10-CM | POA: Diagnosis not present

## 2022-03-08 DIAGNOSIS — E559 Vitamin D deficiency, unspecified: Secondary | ICD-10-CM | POA: Diagnosis not present

## 2022-03-08 DIAGNOSIS — D631 Anemia in chronic kidney disease: Secondary | ICD-10-CM | POA: Diagnosis not present

## 2022-03-08 DIAGNOSIS — E860 Dehydration: Secondary | ICD-10-CM | POA: Diagnosis not present

## 2022-03-08 DIAGNOSIS — E43 Unspecified severe protein-calorie malnutrition: Secondary | ICD-10-CM | POA: Diagnosis not present

## 2022-03-08 DIAGNOSIS — M800AXD Age-related osteoporosis with current pathological fracture, other site, subsequent encounter for fracture with routine healing: Secondary | ICD-10-CM | POA: Diagnosis not present

## 2022-03-08 DIAGNOSIS — J302 Other seasonal allergic rhinitis: Secondary | ICD-10-CM | POA: Diagnosis not present

## 2022-03-08 DIAGNOSIS — K5900 Constipation, unspecified: Secondary | ICD-10-CM | POA: Diagnosis not present

## 2022-03-08 DIAGNOSIS — J310 Chronic rhinitis: Secondary | ICD-10-CM | POA: Diagnosis not present

## 2022-03-08 DIAGNOSIS — E538 Deficiency of other specified B group vitamins: Secondary | ICD-10-CM | POA: Diagnosis not present

## 2022-03-08 DIAGNOSIS — M5432 Sciatica, left side: Secondary | ICD-10-CM | POA: Diagnosis not present

## 2022-03-08 DIAGNOSIS — N1832 Chronic kidney disease, stage 3b: Secondary | ICD-10-CM | POA: Diagnosis not present

## 2022-03-08 DIAGNOSIS — N179 Acute kidney failure, unspecified: Secondary | ICD-10-CM | POA: Diagnosis not present

## 2022-03-08 DIAGNOSIS — Z9181 History of falling: Secondary | ICD-10-CM | POA: Diagnosis not present

## 2022-03-08 DIAGNOSIS — D62 Acute posthemorrhagic anemia: Secondary | ICD-10-CM | POA: Diagnosis not present

## 2022-03-08 DIAGNOSIS — G8929 Other chronic pain: Secondary | ICD-10-CM | POA: Diagnosis not present

## 2022-03-09 ENCOUNTER — Ambulatory Visit (INDEPENDENT_AMBULATORY_CARE_PROVIDER_SITE_OTHER): Payer: Medicare Other | Admitting: Physician Assistant

## 2022-03-09 ENCOUNTER — Encounter: Payer: Self-pay | Admitting: Physician Assistant

## 2022-03-09 ENCOUNTER — Other Ambulatory Visit: Payer: Self-pay

## 2022-03-09 VITALS — BP 106/64 | HR 78 | Temp 98.1°F | Wt 75.0 lb

## 2022-03-09 DIAGNOSIS — N1832 Chronic kidney disease, stage 3b: Secondary | ICD-10-CM | POA: Diagnosis not present

## 2022-03-09 DIAGNOSIS — J302 Other seasonal allergic rhinitis: Secondary | ICD-10-CM | POA: Diagnosis not present

## 2022-03-09 DIAGNOSIS — E559 Vitamin D deficiency, unspecified: Secondary | ICD-10-CM | POA: Diagnosis not present

## 2022-03-09 DIAGNOSIS — E860 Dehydration: Secondary | ICD-10-CM | POA: Diagnosis not present

## 2022-03-09 DIAGNOSIS — M5432 Sciatica, left side: Secondary | ICD-10-CM | POA: Diagnosis not present

## 2022-03-09 DIAGNOSIS — G8929 Other chronic pain: Secondary | ICD-10-CM | POA: Diagnosis not present

## 2022-03-09 DIAGNOSIS — K5900 Constipation, unspecified: Secondary | ICD-10-CM | POA: Diagnosis not present

## 2022-03-09 DIAGNOSIS — R590 Localized enlarged lymph nodes: Secondary | ICD-10-CM

## 2022-03-09 DIAGNOSIS — D631 Anemia in chronic kidney disease: Secondary | ICD-10-CM | POA: Diagnosis not present

## 2022-03-09 DIAGNOSIS — E538 Deficiency of other specified B group vitamins: Secondary | ICD-10-CM | POA: Diagnosis not present

## 2022-03-09 DIAGNOSIS — J310 Chronic rhinitis: Secondary | ICD-10-CM | POA: Diagnosis not present

## 2022-03-09 DIAGNOSIS — M800AXD Age-related osteoporosis with current pathological fracture, other site, subsequent encounter for fracture with routine healing: Secondary | ICD-10-CM | POA: Diagnosis not present

## 2022-03-09 DIAGNOSIS — N189 Chronic kidney disease, unspecified: Secondary | ICD-10-CM | POA: Diagnosis not present

## 2022-03-09 DIAGNOSIS — Z9181 History of falling: Secondary | ICD-10-CM | POA: Diagnosis not present

## 2022-03-09 DIAGNOSIS — N179 Acute kidney failure, unspecified: Secondary | ICD-10-CM | POA: Diagnosis not present

## 2022-03-09 DIAGNOSIS — D62 Acute posthemorrhagic anemia: Secondary | ICD-10-CM | POA: Diagnosis not present

## 2022-03-09 DIAGNOSIS — K8044 Calculus of bile duct with chronic cholecystitis without obstruction: Secondary | ICD-10-CM | POA: Diagnosis not present

## 2022-03-09 DIAGNOSIS — E43 Unspecified severe protein-calorie malnutrition: Secondary | ICD-10-CM | POA: Diagnosis not present

## 2022-03-09 NOTE — Progress Notes (Signed)
?  Established patient acute visit ? ? ?Patient: Julia Kerr   DOB: 30-Jan-1923   88 y.o. Female  MRN: [id number] ?Visit Date: 03/09/2022 ? ?No chief complaint on file. ? ?Subjective  ?  ?HPI  ?Patient presents with c/o swollen lymph nodes.  Patient is accompanied by her son. Noticed they were swollen last week and do not hurt. Denies breast mass, redness or tenderness. Reports having diarrhea after eating depending on what she eats. Coffee and sugar will cause loose stools. No fever, headache, night sweats, chills, or unintentional weight loss. Has been using her walker consistently which she previously did not, used to use her walker. Continues with physical therapy. ? ? ? ?Medications: ?Outpatient Medications Prior to Visit  ?Medication Sig  ? acetaminophen (TYLENOL) 500 MG tablet Take 500 mg by mouth every 6 (six) hours as needed for mild pain.  ? Polyethylene Glycol 400 (VISINE DRY EYE RELIEF OP) Place 1 drop into both eyes as needed (dry eyes).  ? ?No facility-administered medications prior to visit.  ? ? ?Review of Systems ?Review of Systems:  ?A fourteen system review of systems was performed and found to be positive as per HPI. ? ? ?  Objective  ?  ?There were no vitals taken for this visit. ? ? ?Physical Exam  ?General:  Well Developed, well nourished, appropriate for stated age.  ?Neuro:  Alert and oriented,  extra-ocular muscles intact  ?HEENT:  Normocephalic, atraumatic, neck supple  ?Skin:  no gross rash, mild pallor noted. ?Cardiac:  RRR, S1 S2 ?Respiratory: CTA B/L  ?Breast: normal to inspection, no dominant masses appreciated ?Lymph nodes: swollen axillary nodes bilaterally ?Vascular:  Ext warm, no cyanosis apprec.; cap RF less 2 sec. ?Psych:  No HI/SI, judgement and insight good, Euthymic mood. Full Affect. ? ? ?No results found for any visits on 03/09/22. ? Assessment & Plan  ?  ? ?Axillary lymphadenopathy: ?-Non-tender lymph nodes on exam. Discussed potential etiologies. Patient and son will hold on  mammogram, given patient's age and co-morbidities reasonable. Will collect CBC to evaluate for signs of malignancy or infection. Discussed lymphadenopathy possibly related to increased use of walker.  ? ?Anemia associated with chronic renal failure: ?-Last transfusion 02/19/22. ?-Mild pallor noted on exam, will repeat CBC and iron studies.  ? ? ?No follow-ups on file.  ?   ? ? ? ?[name], PA-C  ?[facility] ?[phone] (phone) ?[phone] (fax) ? ?[facility] ?

## 2022-03-09 NOTE — Patient Instructions (Signed)
Linfadenopat?a ?Lymphadenopathy ?El t?rmino linfadenopat?a significa que los ganglios linf?ticos est?n inflamados o son m?s grandes que lo normal. Los ganglios linf?ticos, tambi?n llamados n?dulos linf?ticos, son grupos de tejido que filtran el exceso de l?quido, bacterias, virus y sustancias de desecho del torrente sangu?neo. Forman parte del sistema de defensa de enfermedades del cuerpo (sistema inmunitario) que protege al cuerpo contra los g?rmenes. ?La linfadenopat?a puede [employer], dependiendo del lugar del cuerpo en donde se encuentra. Algunos tipos desaparecen por s? solos. La linfadenopat?a puede producirse en cualquier lugar que tenga ganglios linf?ticos, incluidas las siguientes ?reas: ?Cuello (linfadenopat?a cervical). ?Pecho (linfadenopat?a mediastinal). ?Pulmones (linfadenopat?a hiliar). ?Axilas (linfadenopat?a axilar). ?Ingle (linfadenopat?a inguinal). ?Cuando el sistema inmunitario responde a los microbios, se produce una acumulaci?n de l?quido y c?lulas en los ganglios linf?ticos. Esto produce hinchaz?n y agrandamiento. Si los ganglios linf?ticos no vuelven a su tama?o normal despu?s de haber tenido una infecci?n o enfermedad, el m?dico puede realizar [employer]. Estas pruebas ayudar?n a Aeronautical engineer enfermedad y Actor motivo por el cual los ganglios a?n est?n hinchados y [employer]. ?Siga estas instrucciones en su casa: ? ?Descanse mucho. ?El m?dico puede recomendarle medicamentos de venta libre para Conservation officer, historic buildings. Use los medicamentos de venta libre y los recetados solamente como se lo haya indicado el m?dico. ?Si se lo indican, aplique calor en la zona de los ganglios linf?ticos con la frecuencia que le haya indicado el m?dico. Use la fuente de calor que el m?dico le recomiende, como una compresa de calor h?medo o una almohadilla t?rmica. ?Coloque una [employer] piel y la fuente de Freight forwarder. ?Aplique calor durante 20 a 30 minutos. ?Retire la fuente de calor si la piel se  pone de color rojo brillante. Esto es especialmente importante si no puede sentir dolor, calor o fr?o. Puede correr un mayor riesgo de sufrir quemaduras. ?Controle los ganglios linf?ticos afectados todos los d?as para Transport planner. Controle otras ?reas de ganglios linf?ticos como se lo haya indicado el m?dico. Controle si presenta cambios como: ?Aumento de la hinchaz?n. ?S?bito aumento del tama?o. ?Enrojecimiento o dolor. ?Dureza. ?Concurra a [place]. Esto es importante. ?Comun?quese con un m?dico si tiene: ?Los ganglios linf?ticos: ?Todav?a est?n hinchados despu?s de 2 semanas. ?Se han agrandado repentinamente o la hinchaz?n se ha extendido. ?Est?n rojos o duros, o le duelen. ?L?quido que supura de la piel cerca de un ganglio linf?tico agrandado. ?Problemas para respirar. ?Cristy Hilts, escalofr?os o sudores nocturnos. ?Fatiga. ?Dolor de Investment banker, operational. ?Dolor en el abdomen. ?P?rdida de peso. ?Solicite ayuda de inmediato si tiene: ?Dolor intenso. ?Dolor de pecho. ?Falta de aire. ?Estos s?ntomas pueden representar un problema grave que constituye Engineer, maintenance (IT). No espere a ver si los s?ntomas desaparecen. Solicite atenci?n m?dica de inmediato. Comun?quese con el servicio de emergencias de su localidad (911 en los [place]). No conduzca por sus propios medios [employer]. ?Resumen ?El t?rmino linfadenopat?a significa que los ganglios linf?ticos est?n inflamados o son m?s grandes que lo normal. ?Los ganglios linf?ticos, tambi?n llamados n?dulos linf?ticos, son grupos de tejido que filtran el exceso de l?quido, bacterias, virus y sustancias de desecho del torrente sangu?neo. Forman parte del sistema del cuerpo que combate las enfermedades (sistema inmunitario). ?La linfadenopat?a puede producirse en cualquier lugar que tenga ganglios linf?ticos. ?Si los ganglios linf?ticos no vuelven a su tama?o normal despu?s de una infecci?n o una enfermedad, el m?dico puede hacerle pruebas para  controlar su afecci?n y Midwife motivo por el cual los ganglios a?n est?n hinchados y  agrandados. ?Controle los ganglios linf?ticos afectados todos los d?as para Transport planner. Controle otras ?reas de ganglios linf?ticos como se lo haya indicado el m?dico. ?Esta informaci?n no tiene Marine scientist el consejo del m?dico. Aseg?rese de hacerle al m?dico cualquier pregunta que tenga. ?Document Revised: 11/12/2020 Document Reviewed: 10/17/2020 ?Elsevier Patient Education ? [place]. ? ?

## 2022-03-10 ENCOUNTER — Encounter (HOSPITAL_COMMUNITY): Payer: Medicare Other

## 2022-03-10 ENCOUNTER — Other Ambulatory Visit: Payer: Medicare Other

## 2022-03-10 DIAGNOSIS — N189 Chronic kidney disease, unspecified: Secondary | ICD-10-CM | POA: Diagnosis not present

## 2022-03-10 DIAGNOSIS — R590 Localized enlarged lymph nodes: Secondary | ICD-10-CM | POA: Diagnosis not present

## 2022-03-10 DIAGNOSIS — D631 Anemia in chronic kidney disease: Secondary | ICD-10-CM | POA: Diagnosis not present

## 2022-03-11 DIAGNOSIS — J310 Chronic rhinitis: Secondary | ICD-10-CM | POA: Diagnosis not present

## 2022-03-11 DIAGNOSIS — E43 Unspecified severe protein-calorie malnutrition: Secondary | ICD-10-CM | POA: Diagnosis not present

## 2022-03-11 DIAGNOSIS — N1832 Chronic kidney disease, stage 3b: Secondary | ICD-10-CM | POA: Diagnosis not present

## 2022-03-11 DIAGNOSIS — E559 Vitamin D deficiency, unspecified: Secondary | ICD-10-CM | POA: Diagnosis not present

## 2022-03-11 DIAGNOSIS — N179 Acute kidney failure, unspecified: Secondary | ICD-10-CM | POA: Diagnosis not present

## 2022-03-11 DIAGNOSIS — K8044 Calculus of bile duct with chronic cholecystitis without obstruction: Secondary | ICD-10-CM | POA: Diagnosis not present

## 2022-03-11 DIAGNOSIS — E860 Dehydration: Secondary | ICD-10-CM | POA: Diagnosis not present

## 2022-03-11 DIAGNOSIS — G8929 Other chronic pain: Secondary | ICD-10-CM | POA: Diagnosis not present

## 2022-03-11 DIAGNOSIS — Z9181 History of falling: Secondary | ICD-10-CM | POA: Diagnosis not present

## 2022-03-11 DIAGNOSIS — M800AXD Age-related osteoporosis with current pathological fracture, other site, subsequent encounter for fracture with routine healing: Secondary | ICD-10-CM | POA: Diagnosis not present

## 2022-03-11 DIAGNOSIS — K5900 Constipation, unspecified: Secondary | ICD-10-CM | POA: Diagnosis not present

## 2022-03-11 DIAGNOSIS — J302 Other seasonal allergic rhinitis: Secondary | ICD-10-CM | POA: Diagnosis not present

## 2022-03-11 DIAGNOSIS — D62 Acute posthemorrhagic anemia: Secondary | ICD-10-CM | POA: Diagnosis not present

## 2022-03-11 DIAGNOSIS — M5432 Sciatica, left side: Secondary | ICD-10-CM | POA: Diagnosis not present

## 2022-03-11 DIAGNOSIS — D631 Anemia in chronic kidney disease: Secondary | ICD-10-CM | POA: Diagnosis not present

## 2022-03-11 DIAGNOSIS — E538 Deficiency of other specified B group vitamins: Secondary | ICD-10-CM | POA: Diagnosis not present

## 2022-03-13 LAB — CBC WITH DIFFERENTIAL/PLATELET
Basophils Absolute: 0 10*3/uL (ref 0.0–0.2)
Basos: 0 %
EOS (ABSOLUTE): 0.3 10*3/uL (ref 0.0–0.4)
Eos: 3 %
Hematocrit: 25.4 % — ABNORMAL LOW (ref 34.0–46.6)
Hemoglobin: 8.2 g/dL — ABNORMAL LOW (ref 11.1–15.9)
Lymphocytes Absolute: 1.2 10*3/uL (ref 0.7–3.1)
Lymphs: 14 %
MCH: 29.2 pg (ref 26.6–33.0)
MCHC: 32.3 g/dL (ref 31.5–35.7)
MCV: 90 fL (ref 79–97)
Monocytes Absolute: 1.6 10*3/uL — ABNORMAL HIGH (ref 0.1–0.9)
Monocytes: 19 %
Neutrophils Absolute: 4.9 10*3/uL (ref 1.4–7.0)
Neutrophils: 58 %
Platelets: 203 10*3/uL (ref 150–450)
RBC: 2.81 x10E6/uL — ABNORMAL LOW (ref 3.77–5.28)
RDW: 17.1 % — ABNORMAL HIGH (ref 11.7–15.4)
WBC: 8.4 10*3/uL (ref 3.4–10.8)

## 2022-03-13 LAB — COMPREHENSIVE METABOLIC PANEL
ALT: 16 IU/L (ref 0–32)
AST: 32 IU/L (ref 0–40)
Albumin/Globulin Ratio: 2 (ref 1.2–2.2)
Albumin: 4.2 g/dL (ref 3.5–4.6)
Alkaline Phosphatase: 152 IU/L — ABNORMAL HIGH (ref 44–121)
BUN/Creatinine Ratio: 26 (ref 12–28)
BUN: 44 mg/dL — ABNORMAL HIGH (ref 10–36)
Bilirubin Total: 0.9 mg/dL (ref 0.0–1.2)
CO2: 19 mmol/L — ABNORMAL LOW (ref 20–29)
Calcium: 8.7 mg/dL (ref 8.7–10.3)
Chloride: 106 mmol/L (ref 96–106)
Creatinine, Ser: 1.7 mg/dL — ABNORMAL HIGH (ref 0.57–1.00)
Globulin, Total: 2.1 g/dL (ref 1.5–4.5)
Glucose: 111 mg/dL — ABNORMAL HIGH (ref 70–99)
Potassium: 4.6 mmol/L (ref 3.5–5.2)
Sodium: 140 mmol/L (ref 134–144)
Total Protein: 6.3 g/dL (ref 6.0–8.5)
eGFR: 27 mL/min/{1.73_m2} — ABNORMAL LOW (ref >59)

## 2022-03-13 LAB — IMMATURE CELLS
Blasts/blast like cells: 4 % — ABNORMAL HIGH (ref 0–0)
Metamyelocytes: 2 % — ABNORMAL HIGH (ref 0–0)

## 2022-03-13 LAB — IRON,TIBC AND FERRITIN PANEL
Ferritin: 1896 ng/mL — ABNORMAL HIGH (ref 15–150)
Iron Saturation: 87 % (ref 15–55)
Iron: 158 ug/dL — ABNORMAL HIGH (ref 27–139)
Total Iron Binding Capacity: 182 ug/dL — ABNORMAL LOW (ref 250–450)
UIBC: 24 ug/dL — ABNORMAL LOW (ref 118–369)

## 2022-03-15 ENCOUNTER — Other Ambulatory Visit: Payer: Self-pay

## 2022-03-15 DIAGNOSIS — D72821 Monocytosis (symptomatic): Secondary | ICD-10-CM

## 2022-03-15 DIAGNOSIS — R590 Localized enlarged lymph nodes: Secondary | ICD-10-CM

## 2022-03-16 ENCOUNTER — Telehealth: Payer: Self-pay | Admitting: Hematology and Oncology

## 2022-03-16 DIAGNOSIS — N179 Acute kidney failure, unspecified: Secondary | ICD-10-CM | POA: Diagnosis not present

## 2022-03-16 DIAGNOSIS — G8929 Other chronic pain: Secondary | ICD-10-CM | POA: Diagnosis not present

## 2022-03-16 DIAGNOSIS — D62 Acute posthemorrhagic anemia: Secondary | ICD-10-CM | POA: Diagnosis not present

## 2022-03-16 DIAGNOSIS — K5900 Constipation, unspecified: Secondary | ICD-10-CM | POA: Diagnosis not present

## 2022-03-16 DIAGNOSIS — E43 Unspecified severe protein-calorie malnutrition: Secondary | ICD-10-CM | POA: Diagnosis not present

## 2022-03-16 DIAGNOSIS — K8044 Calculus of bile duct with chronic cholecystitis without obstruction: Secondary | ICD-10-CM | POA: Diagnosis not present

## 2022-03-16 DIAGNOSIS — J302 Other seasonal allergic rhinitis: Secondary | ICD-10-CM | POA: Diagnosis not present

## 2022-03-16 DIAGNOSIS — M5432 Sciatica, left side: Secondary | ICD-10-CM | POA: Diagnosis not present

## 2022-03-16 DIAGNOSIS — N1832 Chronic kidney disease, stage 3b: Secondary | ICD-10-CM | POA: Diagnosis not present

## 2022-03-16 DIAGNOSIS — Z9181 History of falling: Secondary | ICD-10-CM | POA: Diagnosis not present

## 2022-03-16 DIAGNOSIS — M800AXD Age-related osteoporosis with current pathological fracture, other site, subsequent encounter for fracture with routine healing: Secondary | ICD-10-CM | POA: Diagnosis not present

## 2022-03-16 DIAGNOSIS — E538 Deficiency of other specified B group vitamins: Secondary | ICD-10-CM | POA: Diagnosis not present

## 2022-03-16 DIAGNOSIS — D631 Anemia in chronic kidney disease: Secondary | ICD-10-CM | POA: Diagnosis not present

## 2022-03-16 DIAGNOSIS — E860 Dehydration: Secondary | ICD-10-CM | POA: Diagnosis not present

## 2022-03-16 DIAGNOSIS — E559 Vitamin D deficiency, unspecified: Secondary | ICD-10-CM | POA: Diagnosis not present

## 2022-03-16 DIAGNOSIS — J310 Chronic rhinitis: Secondary | ICD-10-CM | POA: Diagnosis not present

## 2022-03-16 NOTE — Telephone Encounter (Signed)
Scheduled appt per 3/27 referral. Pt was already established with Dr. Lorenso Courier. Called pt, no answer. Left msg with appt date and time. Requested for pt to call back to confirm appt.  ?

## 2022-03-17 DIAGNOSIS — M800AXD Age-related osteoporosis with current pathological fracture, other site, subsequent encounter for fracture with routine healing: Secondary | ICD-10-CM | POA: Diagnosis not present

## 2022-03-17 DIAGNOSIS — K5900 Constipation, unspecified: Secondary | ICD-10-CM | POA: Diagnosis not present

## 2022-03-17 DIAGNOSIS — E538 Deficiency of other specified B group vitamins: Secondary | ICD-10-CM | POA: Diagnosis not present

## 2022-03-17 DIAGNOSIS — G8929 Other chronic pain: Secondary | ICD-10-CM | POA: Diagnosis not present

## 2022-03-17 DIAGNOSIS — J310 Chronic rhinitis: Secondary | ICD-10-CM | POA: Diagnosis not present

## 2022-03-17 DIAGNOSIS — E860 Dehydration: Secondary | ICD-10-CM | POA: Diagnosis not present

## 2022-03-17 DIAGNOSIS — E559 Vitamin D deficiency, unspecified: Secondary | ICD-10-CM | POA: Diagnosis not present

## 2022-03-17 DIAGNOSIS — Z9181 History of falling: Secondary | ICD-10-CM | POA: Diagnosis not present

## 2022-03-17 DIAGNOSIS — K8044 Calculus of bile duct with chronic cholecystitis without obstruction: Secondary | ICD-10-CM | POA: Diagnosis not present

## 2022-03-17 DIAGNOSIS — D631 Anemia in chronic kidney disease: Secondary | ICD-10-CM | POA: Diagnosis not present

## 2022-03-17 DIAGNOSIS — M5432 Sciatica, left side: Secondary | ICD-10-CM | POA: Diagnosis not present

## 2022-03-17 DIAGNOSIS — D62 Acute posthemorrhagic anemia: Secondary | ICD-10-CM | POA: Diagnosis not present

## 2022-03-17 DIAGNOSIS — J302 Other seasonal allergic rhinitis: Secondary | ICD-10-CM | POA: Diagnosis not present

## 2022-03-17 DIAGNOSIS — E43 Unspecified severe protein-calorie malnutrition: Secondary | ICD-10-CM | POA: Diagnosis not present

## 2022-03-17 DIAGNOSIS — N179 Acute kidney failure, unspecified: Secondary | ICD-10-CM | POA: Diagnosis not present

## 2022-03-17 DIAGNOSIS — N1832 Chronic kidney disease, stage 3b: Secondary | ICD-10-CM | POA: Diagnosis not present

## 2022-03-18 ENCOUNTER — Ambulatory Visit (HOSPITAL_COMMUNITY)
Admission: RE | Admit: 2022-03-18 | Discharge: 2022-03-18 | Disposition: A | Discharge status: Home / Self Care | Payer: Medicare Other | Source: Ambulatory Visit | Attending: Nephrology | Admitting: Nephrology

## 2022-03-18 ENCOUNTER — Observation Stay
Admission: EM | Admit: 2022-03-18 | Discharge: 2022-03-19 | Disposition: A | Discharge status: Home / Self Care | Payer: Medicare Other | Attending: Internal Medicine | Admitting: Internal Medicine

## 2022-03-18 ENCOUNTER — Other Ambulatory Visit: Payer: Self-pay

## 2022-03-18 ENCOUNTER — Encounter: Payer: Self-pay | Admitting: Emergency Medicine

## 2022-03-18 VITALS — BP 117/49 | HR 83 | Temp 97.0°F | Resp 18

## 2022-03-18 DIAGNOSIS — N183 Chronic kidney disease, stage 3 unspecified: Secondary | ICD-10-CM | POA: Diagnosis not present

## 2022-03-18 DIAGNOSIS — N1832 Chronic kidney disease, stage 3b: Principal | ICD-10-CM

## 2022-03-18 DIAGNOSIS — D631 Anemia in chronic kidney disease: Secondary | ICD-10-CM

## 2022-03-18 DIAGNOSIS — D649 Anemia, unspecified: Secondary | ICD-10-CM

## 2022-03-18 DIAGNOSIS — R636 Underweight: Secondary | ICD-10-CM | POA: Insufficient documentation

## 2022-03-18 DIAGNOSIS — R9431 Abnormal electrocardiogram [ECG] [EKG]: Secondary | ICD-10-CM | POA: Diagnosis not present

## 2022-03-18 DIAGNOSIS — Z8781 Personal history of (healed) traumatic fracture: Secondary | ICD-10-CM

## 2022-03-18 DIAGNOSIS — R591 Generalized enlarged lymph nodes: Secondary | ICD-10-CM | POA: Insufficient documentation

## 2022-03-18 DIAGNOSIS — Z79899 Other long term (current) drug therapy: Secondary | ICD-10-CM | POA: Diagnosis not present

## 2022-03-18 DIAGNOSIS — K8044 Calculus of bile duct with chronic cholecystitis without obstruction: Secondary | ICD-10-CM | POA: Diagnosis present

## 2022-03-18 DIAGNOSIS — Z681 Body mass index (BMI) 19 or less, adult: Secondary | ICD-10-CM | POA: Insufficient documentation

## 2022-03-18 DIAGNOSIS — R590 Localized enlarged lymph nodes: Secondary | ICD-10-CM

## 2022-03-18 LAB — HEPATIC FUNCTION PANEL
ALT: 20 U/L (ref 0–44)
AST: 31 U/L (ref 15–41)
Albumin: 3.9 g/dL (ref 3.5–5.0)
Alkaline Phosphatase: 108 U/L (ref 38–126)
Bilirubin, Direct: 0.2 mg/dL (ref 0.0–0.2)
Indirect Bilirubin: 0.9 mg/dL (ref 0.3–0.9)
Total Bilirubin: 1.1 mg/dL (ref 0.3–1.2)
Total Protein: 6.9 g/dL (ref 6.5–8.1)

## 2022-03-18 LAB — IRON AND TIBC
Iron: 103 ug/dL (ref 28–170)
Saturation Ratios: 50 % — ABNORMAL HIGH (ref 10.4–31.8)
TIBC: 207 ug/dL — ABNORMAL LOW (ref 250–450)
UIBC: 104 ug/dL

## 2022-03-18 LAB — BASIC METABOLIC PANEL
Anion gap: 6 (ref 5–15)
BUN: 46 mg/dL — ABNORMAL HIGH (ref 8–23)
CO2: 25 mmol/L (ref 22–32)
Calcium: 8.3 mg/dL — ABNORMAL LOW (ref 8.9–10.3)
Chloride: 108 mmol/L (ref 98–111)
Creatinine, Ser: 1.71 mg/dL — ABNORMAL HIGH (ref 0.44–1.00)
GFR, Estimated: 27 mL/min — ABNORMAL LOW (ref >60)
Glucose, Bld: 133 mg/dL — ABNORMAL HIGH (ref 70–99)
Potassium: 4.3 mmol/L (ref 3.5–5.1)
Sodium: 139 mmol/L (ref 135–145)

## 2022-03-18 LAB — CBC
HCT: 21.2 % — ABNORMAL LOW (ref 36.0–46.0)
Hemoglobin: 6.5 g/dL — ABNORMAL LOW (ref 12.0–15.0)
MCH: 29.1 pg (ref 26.0–34.0)
MCHC: 30.7 g/dL (ref 30.0–36.0)
MCV: 95.1 fL (ref 80.0–100.0)
Platelets: 178 10*3/uL (ref 150–400)
RBC: 2.23 MIL/uL — ABNORMAL LOW (ref 3.87–5.11)
RDW: 18.3 % — ABNORMAL HIGH (ref 11.5–15.5)
WBC: 10.2 10*3/uL (ref 4.0–10.5)
nRBC: 0 % (ref 0.0–0.2)

## 2022-03-18 LAB — URINALYSIS, ROUTINE W REFLEX MICROSCOPIC
Bacteria, UA: NONE SEEN
Bilirubin Urine: NEGATIVE
Glucose, UA: NEGATIVE mg/dL
Ketones, ur: NEGATIVE mg/dL
Nitrite: NEGATIVE
Protein, ur: 30 mg/dL — AB
Specific Gravity, Urine: 1.008 (ref 1.005–1.030)
pH: 6 (ref 5.0–8.0)

## 2022-03-18 LAB — RETICULOCYTES
Immature Retic Fract: 16.5 % — ABNORMAL HIGH (ref 2.3–15.9)
RBC.: 2.24 MIL/uL — ABNORMAL LOW (ref 3.87–5.11)
Retic Count, Absolute: 52.9 10*3/uL (ref 19.0–186.0)
Retic Ct Pct: 2.4 % (ref 0.4–3.1)

## 2022-03-18 LAB — PREPARE RBC (CROSSMATCH)

## 2022-03-18 MED ORDER — SODIUM CHLORIDE 0.9 % IV SOLN: 10.0000 mL/h | Freq: Once | INTRAVENOUS | Status: DC

## 2022-03-18 MED ORDER — CALCITRIOL 0.25 MCG PO CAPS
0.2500 ug | ORAL_CAPSULE | ORAL | Status: DC
  Administered 2022-03-19: 0.25 ug via ORAL
  Filled 2022-03-18: qty 1

## 2022-03-18 MED ORDER — ONDANSETRON HCL 4 MG/2ML IJ SOLN: 4.0000 mg | Freq: Four times a day (QID) | INTRAMUSCULAR | Status: DC | PRN

## 2022-03-18 MED ORDER — ACETAMINOPHEN 325 MG PO TABS
650.0000 mg | ORAL_TABLET | Freq: Four times a day (QID) | ORAL | Status: DC | PRN
Start: 2022-03-18 — End: 2022-03-19

## 2022-03-18 MED ORDER — ACETAMINOPHEN 650 MG RE SUPP: 650.0000 mg | Freq: Four times a day (QID) | RECTAL | Status: DC | PRN

## 2022-03-18 MED ORDER — EPOETIN ALFA-EPBX 10000 UNIT/ML IJ SOLN
20000.0000 [IU] | INTRAMUSCULAR | Status: DC
  Administered 2022-03-18: 20000 [IU] via SUBCUTANEOUS

## 2022-03-18 MED ORDER — EPOETIN ALFA-EPBX 10000 UNIT/ML IJ SOLN
INTRAMUSCULAR | Status: AC
  Filled 2022-03-18: qty 2

## 2022-03-18 MED ORDER — ONDANSETRON HCL 4 MG PO TABS
4.0000 mg | ORAL_TABLET | Freq: Four times a day (QID) | ORAL | Status: DC | PRN
Start: 2022-03-18 — End: 2022-03-19

## 2022-03-18 NOTE — ED Provider Notes (Signed)
? ?[facility] ?Provider Note ? ? ? Event Date/Time  ? First MD Initiated Contact with Patient 03/18/22 1904   ?  (approximate) ? ? ?History  ? ?Low Hemoglobin ? ? ?HPI ? ?Julia Kerr is a 88 y.o. female  with medical history significant of recent left pelvic fracture in January and anemia from ckd having been hospitalized multiple times in the past year for blood transfusions, receiving EPO outpatient who presents to the emergency room after being referred from nephrologist after routine labs showed an acute drop in her hemoglobin.  Patient is Spanish-speaking and some history is obtained with assistance of son at bedside after patient states she prefers this over but interpreter.  She endorses some fatigue and intermittent some blurry vision.  She still has some soreness in the left pelvis but denies any other acute symptoms including interim falls, stools, blood in the urine, urinary symptoms, diarrhea, chest pain, cough, shortness of breath, fevers or nausea or vomiting.  She is not on any blood thinners or NSAIDs.  No known liver disease.  No other acute concerns at this time. ? ?  ? ? ?Physical Exam  ?Triage Vital Signs: ?ED Triage Vitals  ?Enc Vitals Group  ?   BP 03/18/22 1808 (!) 149/43  ?   Pulse Rate 03/18/22 1808 96  ?   Resp 03/18/22 1808 18  ?   Temp 03/18/22 1808 98.7 ?F (37.1 ?C)  ?   Temp Source 03/18/22 1808 Oral  ?   SpO2 03/18/22 1808 99 %  ?   Weight 03/18/22 1809 81 lb (36.7 kg)  ?   Height 03/18/22 1809 '4\' 8"'$  (1.422 m)  ?   Head Circumference --   ?   Peak Flow --   ?   Pain Score 03/18/22 1808 6  ?   Pain Loc --   ?   Pain Edu? --   ?   Excl. in [place]? --   ? ? ?Most recent vital signs: ?Vitals:  ? 03/18/22 2020 03/18/22 2038  ?BP: (!) 129/56 (!) 127/49  ?Pulse: 82 83  ?Resp: 20 18  ?Temp: 98.9 ?F (37.2 ?C) 99 ?F (37.2 ?C)  ?SpO2: 100% 100%  ? ? ?General: Awake, no distress.  ?CV:  Good peripheral perfusion.  2+ radial pulses. ?Resp:  Normal effort.  ?Abd:  No distention.   Soft. ?Other:  Ill-appearing awake alert and oriented. ? ? ?ED Results / Procedures / Treatments  ?Labs ?(all labs ordered are listed, but only abnormal results are displayed) ?Labs Reviewed  ?BASIC METABOLIC PANEL - Abnormal; Notable for the following components:  ?    Result Value  ? Glucose, Bld 133 (*)   ? BUN 46 (*)   ? Creatinine, Ser 1.71 (*)   ? Calcium 8.3 (*)   ? GFR, Estimated 27 (*)   ? All other components within normal limits  ?CBC - Abnormal; Notable for the following components:  ? RBC 2.23 (*)   ? Hemoglobin 6.5 (*)   ? HCT 21.2 (*)   ? RDW 18.3 (*)   ? All other components within normal limits  ?URINALYSIS, ROUTINE W REFLEX MICROSCOPIC - Abnormal; Notable for the following components:  ? Color, Urine STRAW (*)   ? APPearance CLEAR (*)   ? Hgb urine dipstick SMALL (*)   ? Protein, ur 30 (*)   ? Leukocytes,Ua TRACE (*)   ? All other components within normal limits  ?RETICULOCYTES - Abnormal; Notable for the  following components:  ? RBC. 2.24 (*)   ? Immature Retic Fract 16.5 (*)   ? All other components within normal limits  ?IRON AND TIBC - Abnormal; Notable for the following components:  ? TIBC 207 (*)   ? Saturation Ratios 50 (*)   ? All other components within normal limits  ?HEPATIC FUNCTION PANEL  ?HEMOGLOBIN AND HEMATOCRIT, BLOOD  ?TYPE AND SCREEN  ?PREPARE RBC (CROSSMATCH)  ? ? ? ?EKG ? ?ECG is remarkable for sinus rhythm with a ventricular rate of 91, left axis deviation, unremarkable intervals without clear evidence of acute ischemia there are some isolated nonspecific findings in V2. ? ? ?RADIOLOGY ? ? ? ?PROCEDURES: ? ?Critical Care performed: Yes, see critical care procedure note(s) ? ?.Critical Care ?Performed by: Lucrezia Starch, MD ?Authorized by: Lucrezia Starch, MD  ? ?Critical care provider statement:  ?  Critical care time (minutes):  30 ?  Critical care was necessary to treat or prevent imminent or life-threatening deterioration of the following conditions:  Circulatory  failure ?  Critical care was time spent personally by me on the following activities:  Development of treatment plan with patient or surrogate, discussions with consultants, evaluation of patient's response to treatment, examination of patient, ordering and review of laboratory studies, ordering and review of radiographic studies, ordering and performing treatments and interventions, pulse oximetry, re-evaluation of patient's condition and review of old charts ? ? ? ?MEDICATIONS ORDERED IN ED: ?Medications  ?0.9 %  sodium chloride infusion (0 mL/hr Intravenous Hold 03/18/22 1933)  ?calcitRIOL (ROCALTROL) capsule 0.25 mcg (has no administration in time range)  ?acetaminophen (TYLENOL) tablet 650 mg (has no administration in time range)  ?  Or  ?acetaminophen (TYLENOL) suppository 650 mg (has no administration in time range)  ?ondansetron (ZOFRAN) tablet 4 mg (has no administration in time range)  ?  Or  ?ondansetron (ZOFRAN) injection 4 mg (has no administration in time range)  ? ? ? ?IMPRESSION / MDM / ASSESSMENT AND PLAN / ED COURSE  ?I reviewed the triage vital signs and the nursing notes. ?             ?               ? ?Patient presents for evaluation of fatigue and some outpatient labs showing anemia that is acute on chronic.  Discussing with son at bedside patient has been admitted multiple times for this although in his father this is secondary to anemia of chronic disease and she is receiving EPO.  Patient denies any interim injuries from her fall in January or any other obvious source of bleeding or anemia.  Endorses intermittent blurry vision fatigue Likely symptoms from her anemia.  She does consent with the assistance of son at bedside for blood transfusion.  Will admit for further medical evaluation and management. ? ?At this time I have a low suspicion for GI hemorrhage or interim traumatic bleeding.  Her hepatic function panel is unremarkable for elevated bilirubin that suggest analysis.  EKG is  unremarkable.  BMP shows stable CKD without any other new electrolyte or metabolic derangements.  UA obtained in triage shows small hemoglobin but otherwise unremarkable.  Given she has not had any gross hematuria I have a lower suspicion that this is the cause of symptoms although potentially worth is getting further.  Reticulocyte count 2.4.  Iron panel shows low TIBC and iron saturation ratios with normal iron.  I will order 2 units of PRBCs and admit  to medicine for further evaluation and management. ? ?  ? ? ?FINAL CLINICAL IMPRESSION(S) / ED DIAGNOSES  ? ?Final diagnoses:  ?Low hemoglobin  ? ? ? ?Rx / DC Orders  ? ?ED Discharge Orders   ? ? None  ? ?  ? ? ? ?Note:  This document was prepared using Dragon voice recognition software and may include unintentional dictation errors. ?  ?Lucrezia Starch, MD ?03/18/22 2056 ? ?

## 2022-03-18 NOTE — Assessment & Plan Note (Signed)
Consider nutritionist evaluation ?

## 2022-03-18 NOTE — Assessment & Plan Note (Signed)
Creatinine slightly above baseline ?

## 2022-03-18 NOTE — H&P (Addendum)
?History and Physical  ? ? ?PatientCatie Kerr LNL:[id number] DOB: 1923-12-17 ?DOA: 03/18/2022 ?DOS: the patient was seen and examined on 03/18/2022 ?PCP: [name], PA-C  ?Patient coming from: Home ? ?Chief Complaint:  ?Chief Complaint  ?Patient presents with  ? Low Hemoglobin  ? ? ?HPI: Julia Kerr is a 88 y.o. female with medical history significant for CKD stage IIIb, choledocholithiasis with chronic cholecystitis not amenable to surgery due to age, anemia of CKD on erythropoietin injections, recently hospitalized from 3/3 to 3/4 with symptomatic anemia of 5.4 requiring 2 units PRBCs who was sent to the ED by her nephrologist for hemoglobin of 6.6 at the office.  Patient is symptomatic for fatigue and also mentions intermittent blurry vision.  She sustained a fall back in January 2023 resulting in pelvic ring fracture but she has not had any recent falls or presyncopal or syncopal episodes. ?ED course: BP 138/51 with otherwise normal vitals.  Blood work significant for hemoglobin 6.5, normal MCV, reticulocyte percent 2.4, iron 203, TIBC 207 with saturation UA shows 50%.  BMP showed creatinine 1.71 which is not far from her baseline of 1.51.  Urinalysis showed no evidence of infection.  Patient was started on 1 of 2 units of PRBCs in the ED.  Hospitalist consulted for admission.  ? ?Review of Systems: As mentioned in the history of present illness. All other systems reviewed and are negative. ?Past Medical History:  ?Diagnosis Date  ? Anemia   ? ?Past Surgical History:  ?Procedure Laterality Date  ? CATARACT EXTRACTION, BILATERAL    ? SHOULDER SURGERY    ? ?Social History:  reports that she has never smoked. She has never used smokeless tobacco. She reports that she does not drink alcohol and does not use drugs. ? ?Allergies  ?Allergen Reactions  ? Food Other (See Comments)  ?  Apples and strawberries cause burning of lips  ? ? ?No family history on file. ? ?Prior to Admission medications   ?Medication Sig  Start Date End Date Taking? Authorizing Provider  ?calcitRIOL (ROCALTROL) 0.25 MCG capsule calcitriol 0.25 mcg capsule ? 1 (ONE) CAPSULE 3 DAYS A WEEK    [provider]  ?Polyethylene Glycol 400 (VISINE DRY EYE RELIEF OP) Place 1 drop into both eyes as needed (dry eyes).    [provider]  ?triamcinolone (NASACORT) 55 MCG/ACT AERO nasal inhaler Nasal Allergy 55 mcg spray aerosol ? PLACE 1 SPRAY INTO THE NOSE 2 (TWO) TIMES DAILY.    [provider]  ? ? ?Physical Exam: ?Vitals:  ? 03/18/22 1808 03/18/22 1809 03/18/22 1930  ?BP: (!) 149/43  (!) 127/58  ?Pulse: 96  78  ?Resp: 18  19  ?Temp: 98.7 ?F (37.1 ?C)    ?TempSrc: Oral    ?SpO2: 99%  100%  ?Weight:  36.7 kg   ?Height:  '4\' 8"'$  (1.422 m)   ? ?Physical Exam ?Vitals and nursing note reviewed.  ?Constitutional:   ?   General: She is not in acute distress. ?HENT:  ?   Head: Normocephalic and atraumatic.  ?Cardiovascular:  ?   Rate and Rhythm: Normal rate and regular rhythm.  ?   Pulses: Normal pulses.  ?   Heart sounds: Normal heart sounds.  ?Pulmonary:  ?   Effort: Pulmonary effort is normal.  ?   Breath sounds: Normal breath sounds.  ?Abdominal:  ?   Palpations: Abdomen is soft.  ?   Tenderness: There is no abdominal tenderness.  ?Lymphadenopathy:  ?  Upper Body:  ?   Right upper body: Axillary adenopathy present.  ?   Left upper body: Axillary adenopathy present.  ?Neurological:  ?   Mental Status: Mental status is at baseline.  ? ? ? ?Data Reviewed: ?Relevant notes from primary care and specialist visits, past discharge summaries as available in EHR, including Care Everywhere. ?Prior diagnostic testing as pertinent to current admission diagnoses ?Updated medications and problem lists for reconciliation ?ED course, including vitals, labs, imaging, treatment and response to treatment ?Triage notes, nursing and pharmacy notes and ED provider's notes ?Notable results as noted in HPI ? ? ?Assessment and Plan: ?* Symptomatic  anemia ?Hemoglobin 6.6, in the setting of recent transfusion  ?Continue transfusion of 2 units PRBCs, with recent transfusion 02/2022 in spite of outpatient erythropoietin injections ?Patient was previously on B12 shots. ?Monitor for stability posttransfusion to ensure no acute blood loss ?No evidence right now of acute blood loss ?Can consider hematology consult ? ?Anemia of chronic renal failure, stage 3b ([place]) ?On outpatient erythropoietin to hemoglobin of greater than 11. ? ?Axillary lymphadenopathy ?Bilateral painful enlarged round well delineated and movable axillary nodes on physical exam ?Consider hematology consult ? ?History of pelvic fracture 12/2021 ?No acute disease at present time ?Fall precautions ? ?Stage 3b chronic kidney disease ([place]) ?Creatinine slightly above baseline ? ?Underweight ?Consider nutritionist evaluation ? ?Choledocholithiasis with chronic cholecystitis- not amenable to surgery due to age ?Appears stable without acute issues ? ? ? ? ? ? ?Advance Care Planning:   Code Status: Prior  ? ?Consults: none ? ?Family Communication: none ? ?Severity of Illness: ?The appropriate patient status for this patient is OBSERVATION. Observation status is judged to be reasonable and necessary in order to provide the required intensity of service to ensure the patient's safety. The patient's presenting symptoms, physical exam findings, and initial radiographic and laboratory data in the context of their medical condition is felt to place them at decreased risk for further clinical deterioration. Furthermore, it is anticipated that the patient will be medically stable for discharge from the hospital within 2 midnights of admission.  ? ?Author: ?Athena Masse, MD ?03/18/2022 8:04 PM ? ?For on call review [email or web address].  ?

## 2022-03-18 NOTE — Assessment & Plan Note (Signed)
No acute disease at present time ?Fall precautions ?

## 2022-03-18 NOTE — Assessment & Plan Note (Signed)
Appears stable without acute issues ?

## 2022-03-18 NOTE — ED Triage Notes (Signed)
Patient to ED via POV for low hemoglobin. Patient sent by doctor- hemoglobin 6.6 and needing transfusion. C/o generalized weakness. ?

## 2022-03-18 NOTE — Progress Notes (Signed)
Notified Terri at [facility] of HGB 6.6, patient is asymptomatic and denies any visible blood. Per Dr. Noel Journey we can give injection as ordered then patient needs to go to ED. Terri made aware that patient's son will take patient to [facility] ED for blood transfusion and eval. ?

## 2022-03-18 NOTE — Assessment & Plan Note (Signed)
On outpatient erythropoietin to hemoglobin of greater than 11. ?

## 2022-03-18 NOTE — Assessment & Plan Note (Addendum)
Hemoglobin 6.6, in the setting of recent transfusion  ?Continue transfusion of 2 units PRBCs, with recent transfusion 02/2022 in spite of outpatient erythropoietin injections ?Patient was previously on B12 shots. ?Monitor for stability posttransfusion to ensure no acute blood loss ?No evidence right now of acute blood loss ?Can consider hematology consult ?

## 2022-03-19 ENCOUNTER — Encounter: Payer: Self-pay | Admitting: Internal Medicine

## 2022-03-19 DIAGNOSIS — D649 Anemia, unspecified: Secondary | ICD-10-CM

## 2022-03-19 DIAGNOSIS — R590 Localized enlarged lymph nodes: Secondary | ICD-10-CM

## 2022-03-19 DIAGNOSIS — N1832 Chronic kidney disease, stage 3b: Secondary | ICD-10-CM | POA: Diagnosis not present

## 2022-03-19 LAB — TYPE AND SCREEN
ABO/RH(D): A POS
Antibody Screen: NEGATIVE
Unit division: 0
Unit division: 0

## 2022-03-19 LAB — HEMOGLOBIN AND HEMATOCRIT, BLOOD
HCT: 30.7 % — ABNORMAL LOW (ref 36.0–46.0)
HCT: 33.6 % — ABNORMAL LOW (ref 36.0–46.0)
Hemoglobin: 9.6 g/dL — ABNORMAL LOW (ref 12.0–15.0)
Hemoglobin: 10.6 g/dL — ABNORMAL LOW (ref 12.0–15.0)

## 2022-03-19 LAB — BPAM RBC
Blood Product Expiration Date: 202304222359
Blood Product Expiration Date: 202304222359
ISSUE DATE / TIME: 202303302010
ISSUE DATE / TIME: 202303302346
Unit Type and Rh: 6200
Unit Type and Rh: 6200

## 2022-03-19 LAB — POCT HEMOGLOBIN-HEMACUE: Hemoglobin: 6.6 g/dL — CL (ref 12.0–15.0)

## 2022-03-19 NOTE — Discharge Summary (Signed)
?Physician Discharge Summary ?  ?Patient: Julia Kerr MRN: [id number] DOB: 1923-10-07  ?Admit date:     03/18/2022  ?Discharge date: 03/19/22  ?Discharge Physician: Jennye Boroughs  ? ?PCP: [name], PA-C  ? ?Recommendations at discharge:  ? ? ?Follow-up with Dr. Narda Rutherford, hematologist, on 03/24/2022 as scheduled. ?Follow-up with PCP for routine health maintenance ? ?Discharge Diagnoses: ?Principal Problem: ?  Symptomatic anemia ?Active Problems: ?  Anemia of chronic renal failure, stage 3b ([place]) ?  Axillary lymphadenopathy ?  Choledocholithiasis with chronic cholecystitis- not amenable to surgery due to age ?  Underweight ?  Stage 3b chronic kidney disease ([place]) ?  History of pelvic fracture 12/2021 ? ?Resolved Problems: ?  * No resolved hospital problems. * ? ?Hospital Course: ? ?[name] is a 88 year old woman with medical history significant for CKD stage IIIb, choledocholithiasis, chronic cholecystitis not amenable to surgery due to advanced age, anemia of CKD on erythropoietin injections, pelvic fracture from a fall in January 2023, recently hospitalized from 02/19/2022 to 02/20/2022 for symptomatic anemia with hemoglobin of 5.4 requiring 2 units of blood transfusion.  She was sent to the hospital by her nephrologist because of hemoglobin of 6.6.  She complained of fatigue and intermittent blurry vision. ? ? ?She was admitted to the hospital for symptomatic anemia.  She was transfused with 2 units of packed red blood cells with improvement in her hemoglobin.  Posttransfusion H&H remained stable.  She has bilateral axillary lymphadenopathy that was noted on exam.  Chart review showed that this was noted on exam on 03/09/2022.  Differential diagnoses and work-up for axillary lymphadenopathy were discussed with Roselyn Reef, her son.  Biopsy was offered as well.  However, Roselyn Reef said that patient has an appointment to see Dr. Lorenso Courier, hematologist, within a week.  He will discuss this further with Dr.  Lorenso Courier. ? ?Patient's condition has improved and she is deemed medically stable for discharge to home today. ? ? ? ?  ? ? ?Consultants: None ?Procedures performed: None ?Disposition: Home ?Diet recommendation:  ?Discharge Diet Orders (From admission, onward)  ? ?  Start     Ordered  ? 03/19/22 0000  Diet - low sodium heart healthy       ? 03/19/22 1257  ? ?  ?  ? ?  ? ?Regular diet ?DISCHARGE MEDICATION: ?Allergies as of 03/19/2022   ? ?   Reactions  ? Food Other (See Comments)  ? Apples and strawberries cause burning of lips  ? ?  ? ?  ?Medication List  ?  ? ?STOP taking these medications   ? ?calcitRIOL 0.25 MCG capsule ?Commonly known as: ROCALTROL ?  ? ?  ? ?TAKE these medications   ? ?triamcinolone 55 MCG/ACT Aero nasal inhaler ?Commonly known as: NASACORT ?Nasal Allergy 55 mcg spray aerosol ? PLACE 1 SPRAY INTO THE NOSE 2 (TWO) TIMES DAILY. ?  ?VISINE DRY EYE RELIEF OP ?Place 1 drop into both eyes as needed (dry eyes). ?  ? ?  ? ? ?Discharge Exam: ?Danley Danker Weights  ? 03/18/22 1809  ?Weight: 36.7 kg  ? ?GEN: NAD ?SKIN: Warm and dry.  Bilateral axillary lymphadenopathy (lymph nodes firm to touch, mobile and nontender) ?EYES: EOMI ?ENT: MMM ?CV: RRR ?PULM: CTA B ?ABD: soft, ND, NT, +BS ?CNS: AAO x 3, non focal ?EXT: No edema or tenderness ? ? ?Condition at discharge: good ? ?The results of significant diagnostics from this hospitalization (including imaging, microbiology, ancillary and laboratory) are listed below for reference.  ? ?  Imaging Studies: ?No results found. ? ?Microbiology: ?Results for orders placed or performed during the hospital encounter of 02/19/22  ?Resp Panel by RT-PCR (Flu A&B, Covid) Nasopharyngeal Swab     Status: None  ? Collection Time: 02/19/22  9:01 PM  ? Specimen: Nasopharyngeal Swab; Nasopharyngeal(NP) swabs in vial transport medium  ?Result Value Ref Range Status  ? SARS Coronavirus 2 by RT PCR NEGATIVE NEGATIVE Final  ?  Comment: (NOTE) ?SARS-CoV-2 target nucleic acids are NOT  DETECTED. ? ?The SARS-CoV-2 RNA is generally detectable in upper respiratory ?specimens during the acute phase of infection. The lowest ?concentration of SARS-CoV-2 viral copies this assay can detect is ?138 copies/mL. A negative result does not preclude SARS-Cov-2 ?infection and should not be used as the sole basis for treatment or ?other patient management decisions. A negative result may occur with  ?improper specimen collection/handling, submission of specimen other ?than nasopharyngeal swab, presence of viral mutation(s) within the ?areas targeted by this assay, and inadequate number of viral ?copies(<138 copies/mL). A negative result must be combined with ?clinical observations, patient history, and epidemiological ?information. The expected result is Negative. ? ?Fact Sheet for Patients:  ?EntrepreneurPulse.com.au ? ?Fact Sheet for Healthcare Providers:  ?IncredibleEmployment.be ? ?This test is no t yet approved or cleared by the [place] FDA and  ?has been authorized for detection and/or diagnosis of SARS-CoV-2 by ?FDA under an Emergency Use Authorization (EUA). This EUA will remain  ?in effect (meaning this test can be used) for the duration of the ?COVID-19 declaration under Section 564(b)(1) of the Act, 21 ?U.S.C.section 360bbb-3(b)(1), unless the authorization is terminated  ?or revoked sooner.  ? ? ?  ? Influenza A by PCR NEGATIVE NEGATIVE Final  ? Influenza B by PCR NEGATIVE NEGATIVE Final  ?  Comment: (NOTE) ?The Xpert Xpress SARS-CoV-2/FLU/RSV plus assay is intended as an aid ?in the diagnosis of influenza from Nasopharyngeal swab specimens and ?should not be used as a sole basis for treatment. Nasal washings and ?aspirates are unacceptable for Xpert Xpress SARS-CoV-2/FLU/RSV ?testing. ? ?Fact Sheet for Patients: ?EntrepreneurPulse.com.au ? ?Fact Sheet for Healthcare Providers: ?IncredibleEmployment.be ? ?This test is not yet  approved or cleared by the [place] FDA and ?has been authorized for detection and/or diagnosis of SARS-CoV-2 by ?FDA under an Emergency Use Authorization (EUA). This EUA will remain ?in effect (meaning this test can be used) for the duration of the ?COVID-19 declaration under Section 564(b)(1) of the Act, 21 U.S.C. ?section 360bbb-3(b)(1), unless the authorization is terminated or ?revoked. ? ?Performed at [facility], [place], ?[place] [address] ?  ? ? ?Labs: ?CBC: ?Recent Labs  ?Lab 03/18/22 ?1815 03/19/22 ?1245 03/19/22 ?1130  ?WBC 10.2  --   --   ?HGB 6.5* 9.6* 10.6*  ?HCT 21.2* 30.7* 33.6*  ?MCV 95.1  --   --   ?PLT 178  --   --   ? ?Basic Metabolic Panel: ?Recent Labs  ?Lab 03/18/22 ?1815  ?NA 139  ?K 4.3  ?CL 108  ?CO2 25  ?GLUCOSE 133*  ?BUN 46*  ?CREATININE 1.71*  ?CALCIUM 8.3*  ? ?Liver Function Tests: ?Recent Labs  ?Lab 03/18/22 ?1815  ?AST 31  ?ALT 20  ?ALKPHOS 108  ?BILITOT 1.1  ?PROT 6.9  ?ALBUMIN 3.9  ? ?CBG: ?No results for input(s): GLUCAP in the last 168 hours. ? ?Discharge time spent: greater than 30 minutes. ? ?Signed: ?Jennye Boroughs, MD ?[facility] ?03/19/2022 ?

## 2022-03-19 NOTE — ED Notes (Signed)
Interpreter used for pt communication. Pt refusing to speak saying she will wait on family member.  ? ?Refusing vital sign recheck. Request bathroom assistance. Pt refusing.  ?

## 2022-03-19 NOTE — ED Notes (Signed)
Son of pt, Julia Kerr, now at bedside talking with pt. ?

## 2022-03-19 NOTE — ED Notes (Signed)
Pt resting in bed in NAD. Pt content that son York Cerise is coming to be with her.  ?

## 2022-03-19 NOTE — ED Notes (Signed)
Ambulated to bedside commode with 1 assist. Unsteady.  ?

## 2022-03-19 NOTE — Assessment & Plan Note (Addendum)
Bilateral painful enlarged round well delineated and movable axillary nodes on physical exam ?Consider hematology consult ?

## 2022-03-19 NOTE — Progress Notes (Signed)
Admission profile updated. ?

## 2022-03-19 NOTE — ED Notes (Signed)
Helped pt walk to toilet to void. Pt back in bed. Provided phone for pt to talk to son York Cerise. Pt asking son to come. Pt complaining that bed is uncomfortable. Bed alarm placed on bed.  ?

## 2022-03-19 NOTE — ED Notes (Signed)
Transportation requested  

## 2022-03-24 ENCOUNTER — Inpatient Hospital Stay: Payer: MEDICAID | Admitting: Hematology and Oncology

## 2022-03-24 ENCOUNTER — Inpatient Hospital Stay: Payer: Medicare Other | Attending: Hematology and Oncology | Admitting: Hematology and Oncology

## 2022-03-24 ENCOUNTER — Other Ambulatory Visit: Payer: Self-pay

## 2022-03-24 ENCOUNTER — Inpatient Hospital Stay: Payer: Medicare Other

## 2022-03-24 ENCOUNTER — Inpatient Hospital Stay: Payer: MEDICAID

## 2022-03-24 VITALS — BP 126/70 | HR 82 | Temp 98.2°F | Resp 16 | Wt 80.8 lb

## 2022-03-24 DIAGNOSIS — N1832 Chronic kidney disease, stage 3b: Secondary | ICD-10-CM | POA: Diagnosis not present

## 2022-03-24 DIAGNOSIS — R590 Localized enlarged lymph nodes: Secondary | ICD-10-CM | POA: Diagnosis not present

## 2022-03-24 DIAGNOSIS — D631 Anemia in chronic kidney disease: Secondary | ICD-10-CM | POA: Diagnosis not present

## 2022-03-24 DIAGNOSIS — N189 Chronic kidney disease, unspecified: Secondary | ICD-10-CM | POA: Diagnosis not present

## 2022-03-24 NOTE — Progress Notes (Signed)
?[facility] ?[phone]   [phone] ? ?PROGRESS NOTE ? ?Patient Care Team: ?[name], PA-C as PCP - General ? ?Hematological/Oncological History ?# Normocytic Anemia ?08/04/2018: WBC 4.2, Hgb 9.4, MCV 89, Plt 264 ?05/05/2021: WBC 12.2, Hgb 8.9, MCV 94.9, Plt 229 ?09/23/2021: WBC 7.2, Hgb 6.6, MCV 101.9, Plt 173. Ferritin 1059, TIBC 245, Iron sat 27%. Retc 4.3% ?09/24/2021: WBC 10.1, Hgb 8.9, MCV 92, Plt 186 ?09/30/2021: establish care with Dr. Lorenso Courier  ? ?Interval History:  ?Yliana Steveson 88 y.o. female with medical history significant for normocytic anemia and axillary lymphadenopathy who presents for a follow up visit. The patient's last visit was on 09/30/2021 at which time she established care. In the interim since the last visit she was admitted to the hospital from 03/18/2022 until 03/19/2022 with symptomatic anemia. ? ?On exam today Mrs. Kulish is accompanied by her son who is her legal guardian.  He does most of the talking for her.  He notes that she has been faithfully receiving erythropoietin shots but after she fractured her hip missed them for several sessions.  There is a big drop in her hemoglobin at that time.  Additionally he notes that lymph nodes developed under her arms within the last 2 weeks.  Largest one is approximately about the size of a "golf ball".  She has not been having any fevers, chills, sweats, nausea vomiting or diarrhea.  She is not having any weight loss.  She does not have lymphadenopathy elsewhere in her body.  A full 10 point ROS is listed below. ? ?MEDICAL HISTORY:  ?Past Medical History:  ?Diagnosis Date  ? Anemia   ? ? ?SURGICAL HISTORY: ?Past Surgical History:  ?Procedure Laterality Date  ? CATARACT EXTRACTION, BILATERAL    ? SHOULDER SURGERY    ? ? ?SOCIAL HISTORY: ?Social History  ? ?Socioeconomic History  ? Marital status: Widowed  ?  Spouse name: Not on file  ? Number of children: Not on file  ? Years of education: Not on file  ?  Highest education level: Not on file  ?Occupational History  ? Not on file  ?Tobacco Use  ? Smoking status: Never  ? Smokeless tobacco: Never  ?Vaping Use  ? Vaping Use: Never used  ?Substance and Sexual Activity  ? Alcohol use: Never  ? Drug use: Never  ? Sexual activity: Not Currently  ?Other Topics Concern  ? Not on file  ?Social History Narrative  ? Not on file  ? ?Social Determinants of Health  ? ?Financial Resource Strain: Not on file  ?Food Insecurity: Not on file  ?Transportation Needs: Not on file  ?Physical Activity: Not on file  ?Stress: Not on file  ?Social Connections: Not on file  ?Intimate Partner Violence: Not on file  ? ? ?FAMILY HISTORY: ?No family history on file. ? ?ALLERGIES:  is allergic to food. ? ?MEDICATIONS:  ?Current Outpatient Medications  ?Medication Sig Dispense Refill  ? Polyethylene Glycol 400 (VISINE DRY EYE RELIEF OP) Place 1 drop into both eyes as needed (dry eyes).    ? triamcinolone (NASACORT) 55 MCG/ACT AERO nasal inhaler Nasal Allergy 55 mcg spray aerosol ? PLACE 1 SPRAY INTO THE NOSE 2 (TWO) TIMES DAILY.    ? ?No current facility-administered medications for this visit.  ? ? ?REVIEW OF SYSTEMS:   ?Constitutional: ( - ) fevers, ( - )  chills , ( - ) night sweats ?Eyes: ( - ) blurriness of vision, ( - ) double vision, ( - )  watery eyes ?Ears, nose, mouth, throat, and face: ( - ) mucositis, ( - ) sore throat ?Respiratory: ( - ) cough, ( - ) dyspnea, ( - ) wheezes ?Cardiovascular: ( - ) palpitation, ( - ) chest discomfort, ( - ) lower extremity swelling ?Gastrointestinal:  ( - ) nausea, ( - ) heartburn, ( - ) change in bowel habits ?Skin: ( - ) abnormal skin rashes ?Lymphatics: ( - ) new lymphadenopathy, ( - ) easy bruising ?Neurological: ( - ) numbness, ( - ) tingling, ( - ) new weaknesses ?Behavioral/Psych: ( - ) mood change, ( - ) new changes  ?All other systems were reviewed with the patient and are negative. ? ?PHYSICAL EXAMINATION: ? ?Vitals:  ? 03/24/22 1122  ?BP: 126/70   ?Pulse: 82  ?Resp: 16  ?Temp: 98.2 ?F (36.8 ?C)  ?SpO2: 97%  ? ?Filed Weights  ? 03/24/22 1122  ?Weight: 80 lb 12.8 oz (36.7 kg)  ? ? ?GENERAL: Well-appearing elderly Hispanic female, alert, no distress and comfortable ?SKIN: skin color, texture, turgor are normal, no rashes or significant lesions ?EYES: conjunctiva are pink and non-injected, sclera clear ?LYMPH: Easily palpable bilateral axillary lymph nodes, largest approximately 2 cm.  Otherwise no palpable lymphadenopathy in the cervical or inguinal ?LUNGS: clear to auscultation and percussion with normal breathing effort ?HEART: regular rate & rhythm and no murmurs and no lower extremity edema ?Musculoskeletal: no cyanosis of digits and no clubbing  ?PSYCH: alert & oriented x 3, fluent speech ?NEURO: no focal motor/sensory deficits ? ?LABORATORY DATA:  ?I have reviewed the data as listed ? ?  Latest Ref Rng & Units 03/19/2022  ? 11:30 AM 03/19/2022  ?  5:13 AM 03/18/2022  ?  6:15 PM  ?CBC  ?WBC 4.0 - 10.5 K/uL   10.2    ?Hemoglobin 12.0 - 15.0 g/dL 10.6   9.6   6.5    ?Hematocrit 36.0 - 46.0 % 33.6   30.7   21.2    ?Platelets 150 - 400 K/uL   178    ? ? ? ?  Latest Ref Rng & Units 03/18/2022  ?  6:15 PM 03/10/2022  ?  9:30 AM 02/19/2022  ?  7:26 PM  ?CMP  ?Glucose 70 - 99 mg/dL 133   111   146    ?BUN 8 - 23 mg/dL 46   44   40    ?Creatinine 0.44 - 1.00 mg/dL 1.71   1.70   1.51    ?Sodium 135 - 145 mmol/L 139   140   140    ?Potassium 3.5 - 5.1 mmol/L 4.3   4.6   4.4    ?Chloride 98 - 111 mmol/L 108   106   109    ?CO2 22 - 32 mmol/L 25   19   21     ?Calcium 8.9 - 10.3 mg/dL 8.3   8.7   8.6    ?Total Protein 6.5 - 8.1 g/dL 6.9   6.3   6.7    ?Total Bilirubin 0.3 - 1.2 mg/dL 1.1   0.9   0.9    ?Alkaline Phos 38 - 126 U/L 108   152   123    ?AST 15 - 41 U/L 31   32   22    ?ALT 0 - 44 U/L 20   16   15     ? ? ?Lab Results  ?Component Value Date  ? MPROTEIN Not Observed 09/30/2021  ? ? ?RADIOGRAPHIC STUDIES: ?  No results found. ? ?ASSESSMENT & PLAN ?Mayrani Bechler 88 y.o.  female with medical history significant for normocytic anemia and axillary lymphadenopathy who presents for a follow up visit. ? ?#Anemia 2/2 to CKD ?-- Recommended the patient reconnect with her nephrologist and begin the erythropoietin shots. ?--We will order additional lab work to assure no other etiologies of her anemia.  Iron panel was previously unremarkable.  We will order full nutritional panel as well as monoclonal myopathy work-up ?--Do not recommend bone marrow biopsy at this time.  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 ?--Blood work was ordered today but the patient declined due to having recently been hospitalized and having blood drawn at that time. ?--RTC in 3 months time.  ? ?# Bilateral Axillary Lymphadenopathy ?--Unclear etiology though these have only been present for approximately 2 weeks time ?--Patient not having any other systemic symptoms at this time ?--Discussed options moving forward including biopsy, excisional biopsy, and continued observation ?--Patient is somewhat agreeable to continued observation at this time. ?--We ordered blood work to further assess but the patient declined to have the blood work performed today. ? ? ?Orders Placed This Encounter  ?Procedures  ? CBC with Differential ([place] Only)  ?  Standing Status:   Future  ?  Number of Occurrences:   1  ?  Standing Expiration Date:   03/25/2023  ? ? ?All questions were answered. The patient knows to call the clinic with any problems, questions or concerns. ? ?A total of more than 30 minutes were spent on this encounter with face-to-face time and non-face-to-face time, including preparing to see the patient, ordering tests and/or medications, counseling the patient and coordination of care as outlined above.  ? ?Ledell Peoples, MD ?Department of Hematology/Oncology ?[place] [facility] ?Phone: [phone] ?Pager: [phone] ?Email:  [email or web address] ? ?03/30/2022 2:13 PM ? ?

## 2022-03-25 DIAGNOSIS — E538 Deficiency of other specified B group vitamins: Secondary | ICD-10-CM | POA: Diagnosis not present

## 2022-03-25 DIAGNOSIS — N179 Acute kidney failure, unspecified: Secondary | ICD-10-CM | POA: Diagnosis not present

## 2022-03-25 DIAGNOSIS — G8929 Other chronic pain: Secondary | ICD-10-CM | POA: Diagnosis not present

## 2022-03-25 DIAGNOSIS — N1832 Chronic kidney disease, stage 3b: Secondary | ICD-10-CM | POA: Diagnosis not present

## 2022-03-25 DIAGNOSIS — Z9181 History of falling: Secondary | ICD-10-CM | POA: Diagnosis not present

## 2022-03-25 DIAGNOSIS — E559 Vitamin D deficiency, unspecified: Secondary | ICD-10-CM | POA: Diagnosis not present

## 2022-03-25 DIAGNOSIS — K5900 Constipation, unspecified: Secondary | ICD-10-CM | POA: Diagnosis not present

## 2022-03-25 DIAGNOSIS — D62 Acute posthemorrhagic anemia: Secondary | ICD-10-CM | POA: Diagnosis not present

## 2022-03-25 DIAGNOSIS — E860 Dehydration: Secondary | ICD-10-CM | POA: Diagnosis not present

## 2022-03-25 DIAGNOSIS — D631 Anemia in chronic kidney disease: Secondary | ICD-10-CM | POA: Diagnosis not present

## 2022-03-25 DIAGNOSIS — K8044 Calculus of bile duct with chronic cholecystitis without obstruction: Secondary | ICD-10-CM | POA: Diagnosis not present

## 2022-03-25 DIAGNOSIS — J310 Chronic rhinitis: Secondary | ICD-10-CM | POA: Diagnosis not present

## 2022-03-25 DIAGNOSIS — E43 Unspecified severe protein-calorie malnutrition: Secondary | ICD-10-CM | POA: Diagnosis not present

## 2022-03-25 DIAGNOSIS — M800AXD Age-related osteoporosis with current pathological fracture, other site, subsequent encounter for fracture with routine healing: Secondary | ICD-10-CM | POA: Diagnosis not present

## 2022-03-25 DIAGNOSIS — J302 Other seasonal allergic rhinitis: Secondary | ICD-10-CM | POA: Diagnosis not present

## 2022-03-25 DIAGNOSIS — M5432 Sciatica, left side: Secondary | ICD-10-CM | POA: Diagnosis not present

## 2022-03-26 DIAGNOSIS — J302 Other seasonal allergic rhinitis: Secondary | ICD-10-CM | POA: Diagnosis not present

## 2022-03-26 DIAGNOSIS — D631 Anemia in chronic kidney disease: Secondary | ICD-10-CM | POA: Diagnosis not present

## 2022-03-26 DIAGNOSIS — J310 Chronic rhinitis: Secondary | ICD-10-CM | POA: Diagnosis not present

## 2022-03-26 DIAGNOSIS — D62 Acute posthemorrhagic anemia: Secondary | ICD-10-CM | POA: Diagnosis not present

## 2022-03-26 DIAGNOSIS — E538 Deficiency of other specified B group vitamins: Secondary | ICD-10-CM | POA: Diagnosis not present

## 2022-03-26 DIAGNOSIS — N179 Acute kidney failure, unspecified: Secondary | ICD-10-CM | POA: Diagnosis not present

## 2022-03-26 DIAGNOSIS — M800AXD Age-related osteoporosis with current pathological fracture, other site, subsequent encounter for fracture with routine healing: Secondary | ICD-10-CM | POA: Diagnosis not present

## 2022-03-26 DIAGNOSIS — K5900 Constipation, unspecified: Secondary | ICD-10-CM | POA: Diagnosis not present

## 2022-03-26 DIAGNOSIS — K8044 Calculus of bile duct with chronic cholecystitis without obstruction: Secondary | ICD-10-CM | POA: Diagnosis not present

## 2022-03-26 DIAGNOSIS — G8929 Other chronic pain: Secondary | ICD-10-CM | POA: Diagnosis not present

## 2022-03-26 DIAGNOSIS — E559 Vitamin D deficiency, unspecified: Secondary | ICD-10-CM | POA: Diagnosis not present

## 2022-03-26 DIAGNOSIS — Z9181 History of falling: Secondary | ICD-10-CM | POA: Diagnosis not present

## 2022-03-26 DIAGNOSIS — M5432 Sciatica, left side: Secondary | ICD-10-CM | POA: Diagnosis not present

## 2022-03-26 DIAGNOSIS — E43 Unspecified severe protein-calorie malnutrition: Secondary | ICD-10-CM | POA: Diagnosis not present

## 2022-03-26 DIAGNOSIS — N1832 Chronic kidney disease, stage 3b: Secondary | ICD-10-CM | POA: Diagnosis not present

## 2022-03-26 DIAGNOSIS — E860 Dehydration: Secondary | ICD-10-CM | POA: Diagnosis not present

## 2022-03-29 DIAGNOSIS — N1832 Chronic kidney disease, stage 3b: Secondary | ICD-10-CM | POA: Diagnosis not present

## 2022-03-29 DIAGNOSIS — N2581 Secondary hyperparathyroidism of renal origin: Secondary | ICD-10-CM | POA: Diagnosis not present

## 2022-03-29 DIAGNOSIS — E46 Unspecified protein-calorie malnutrition: Secondary | ICD-10-CM | POA: Diagnosis not present

## 2022-03-29 DIAGNOSIS — K8044 Calculus of bile duct with chronic cholecystitis without obstruction: Secondary | ICD-10-CM | POA: Diagnosis not present

## 2022-03-29 DIAGNOSIS — D631 Anemia in chronic kidney disease: Secondary | ICD-10-CM | POA: Diagnosis not present

## 2022-03-30 ENCOUNTER — Encounter (HOSPITAL_COMMUNITY): Payer: Self-pay

## 2022-03-30 ENCOUNTER — Telehealth: Payer: Self-pay | Admitting: Hematology and Oncology

## 2022-03-30 DIAGNOSIS — E43 Unspecified severe protein-calorie malnutrition: Secondary | ICD-10-CM | POA: Diagnosis not present

## 2022-03-30 DIAGNOSIS — K5900 Constipation, unspecified: Secondary | ICD-10-CM | POA: Diagnosis not present

## 2022-03-30 DIAGNOSIS — E860 Dehydration: Secondary | ICD-10-CM | POA: Diagnosis not present

## 2022-03-30 DIAGNOSIS — M800AXD Age-related osteoporosis with current pathological fracture, other site, subsequent encounter for fracture with routine healing: Secondary | ICD-10-CM | POA: Diagnosis not present

## 2022-03-30 DIAGNOSIS — J302 Other seasonal allergic rhinitis: Secondary | ICD-10-CM | POA: Diagnosis not present

## 2022-03-30 DIAGNOSIS — E559 Vitamin D deficiency, unspecified: Secondary | ICD-10-CM | POA: Diagnosis not present

## 2022-03-30 DIAGNOSIS — N179 Acute kidney failure, unspecified: Secondary | ICD-10-CM | POA: Diagnosis not present

## 2022-03-30 DIAGNOSIS — N1832 Chronic kidney disease, stage 3b: Secondary | ICD-10-CM | POA: Diagnosis not present

## 2022-03-30 DIAGNOSIS — D62 Acute posthemorrhagic anemia: Secondary | ICD-10-CM | POA: Diagnosis not present

## 2022-03-30 DIAGNOSIS — D631 Anemia in chronic kidney disease: Secondary | ICD-10-CM | POA: Diagnosis not present

## 2022-03-30 DIAGNOSIS — K8044 Calculus of bile duct with chronic cholecystitis without obstruction: Secondary | ICD-10-CM | POA: Diagnosis not present

## 2022-03-30 DIAGNOSIS — G8929 Other chronic pain: Secondary | ICD-10-CM | POA: Diagnosis not present

## 2022-03-30 DIAGNOSIS — E538 Deficiency of other specified B group vitamins: Secondary | ICD-10-CM | POA: Diagnosis not present

## 2022-03-30 DIAGNOSIS — J310 Chronic rhinitis: Secondary | ICD-10-CM | POA: Diagnosis not present

## 2022-03-30 DIAGNOSIS — Z9181 History of falling: Secondary | ICD-10-CM | POA: Diagnosis not present

## 2022-03-30 DIAGNOSIS — M5432 Sciatica, left side: Secondary | ICD-10-CM | POA: Diagnosis not present

## 2022-03-30 NOTE — Telephone Encounter (Signed)
Per 4/11 in basket. Called pt and spoke to son.  Pt son confirmed appointments  ?

## 2022-04-01 ENCOUNTER — Ambulatory Visit (HOSPITAL_COMMUNITY)
Admission: RE | Admit: 2022-04-01 | Discharge: 2022-04-01 | Disposition: A | Discharge status: Home / Self Care | Payer: Medicare Other | Source: Ambulatory Visit | Attending: Nephrology | Admitting: Nephrology

## 2022-04-01 VITALS — BP 125/47 | HR 85 | Temp 97.6°F | Resp 18

## 2022-04-01 DIAGNOSIS — Z9181 History of falling: Secondary | ICD-10-CM | POA: Diagnosis not present

## 2022-04-01 DIAGNOSIS — E538 Deficiency of other specified B group vitamins: Secondary | ICD-10-CM | POA: Diagnosis not present

## 2022-04-01 DIAGNOSIS — K5900 Constipation, unspecified: Secondary | ICD-10-CM | POA: Diagnosis not present

## 2022-04-01 DIAGNOSIS — N179 Acute kidney failure, unspecified: Secondary | ICD-10-CM | POA: Diagnosis not present

## 2022-04-01 DIAGNOSIS — M800AXD Age-related osteoporosis with current pathological fracture, other site, subsequent encounter for fracture with routine healing: Secondary | ICD-10-CM | POA: Diagnosis not present

## 2022-04-01 DIAGNOSIS — G8929 Other chronic pain: Secondary | ICD-10-CM | POA: Diagnosis not present

## 2022-04-01 DIAGNOSIS — J302 Other seasonal allergic rhinitis: Secondary | ICD-10-CM | POA: Diagnosis not present

## 2022-04-01 DIAGNOSIS — K8044 Calculus of bile duct with chronic cholecystitis without obstruction: Secondary | ICD-10-CM | POA: Diagnosis not present

## 2022-04-01 DIAGNOSIS — D62 Acute posthemorrhagic anemia: Secondary | ICD-10-CM | POA: Diagnosis not present

## 2022-04-01 DIAGNOSIS — E559 Vitamin D deficiency, unspecified: Secondary | ICD-10-CM | POA: Diagnosis not present

## 2022-04-01 DIAGNOSIS — E860 Dehydration: Secondary | ICD-10-CM | POA: Diagnosis not present

## 2022-04-01 DIAGNOSIS — E43 Unspecified severe protein-calorie malnutrition: Secondary | ICD-10-CM | POA: Diagnosis not present

## 2022-04-01 DIAGNOSIS — J310 Chronic rhinitis: Secondary | ICD-10-CM | POA: Diagnosis not present

## 2022-04-01 DIAGNOSIS — D631 Anemia in chronic kidney disease: Secondary | ICD-10-CM | POA: Diagnosis not present

## 2022-04-01 DIAGNOSIS — N1832 Chronic kidney disease, stage 3b: Secondary | ICD-10-CM | POA: Diagnosis not present

## 2022-04-01 DIAGNOSIS — M5432 Sciatica, left side: Secondary | ICD-10-CM | POA: Diagnosis not present

## 2022-04-01 LAB — POCT HEMOGLOBIN-HEMACUE: Hemoglobin: 9.9 g/dL — ABNORMAL LOW (ref 12.0–15.0)

## 2022-04-01 MED ORDER — EPOETIN ALFA-EPBX 10000 UNIT/ML IJ SOLN
20000.0000 [IU] | INTRAMUSCULAR | Status: DC
  Administered 2022-04-01: 20000 [IU] via SUBCUTANEOUS

## 2022-04-01 MED ORDER — EPOETIN ALFA-EPBX 10000 UNIT/ML IJ SOLN
INTRAMUSCULAR | Status: AC
  Filled 2022-04-01: qty 2

## 2022-04-02 DIAGNOSIS — D631 Anemia in chronic kidney disease: Secondary | ICD-10-CM | POA: Diagnosis not present

## 2022-04-02 DIAGNOSIS — G8929 Other chronic pain: Secondary | ICD-10-CM | POA: Diagnosis not present

## 2022-04-02 DIAGNOSIS — N1832 Chronic kidney disease, stage 3b: Secondary | ICD-10-CM | POA: Diagnosis not present

## 2022-04-02 DIAGNOSIS — E538 Deficiency of other specified B group vitamins: Secondary | ICD-10-CM | POA: Diagnosis not present

## 2022-04-02 DIAGNOSIS — E559 Vitamin D deficiency, unspecified: Secondary | ICD-10-CM | POA: Diagnosis not present

## 2022-04-02 DIAGNOSIS — Z9181 History of falling: Secondary | ICD-10-CM | POA: Diagnosis not present

## 2022-04-02 DIAGNOSIS — J302 Other seasonal allergic rhinitis: Secondary | ICD-10-CM | POA: Diagnosis not present

## 2022-04-02 DIAGNOSIS — M800AXD Age-related osteoporosis with current pathological fracture, other site, subsequent encounter for fracture with routine healing: Secondary | ICD-10-CM | POA: Diagnosis not present

## 2022-04-02 DIAGNOSIS — K5900 Constipation, unspecified: Secondary | ICD-10-CM | POA: Diagnosis not present

## 2022-04-02 DIAGNOSIS — J310 Chronic rhinitis: Secondary | ICD-10-CM | POA: Diagnosis not present

## 2022-04-02 DIAGNOSIS — D62 Acute posthemorrhagic anemia: Secondary | ICD-10-CM | POA: Diagnosis not present

## 2022-04-02 DIAGNOSIS — N179 Acute kidney failure, unspecified: Secondary | ICD-10-CM | POA: Diagnosis not present

## 2022-04-02 DIAGNOSIS — K8044 Calculus of bile duct with chronic cholecystitis without obstruction: Secondary | ICD-10-CM | POA: Diagnosis not present

## 2022-04-02 DIAGNOSIS — E43 Unspecified severe protein-calorie malnutrition: Secondary | ICD-10-CM | POA: Diagnosis not present

## 2022-04-02 DIAGNOSIS — E860 Dehydration: Secondary | ICD-10-CM | POA: Diagnosis not present

## 2022-04-02 DIAGNOSIS — M5432 Sciatica, left side: Secondary | ICD-10-CM | POA: Diagnosis not present

## 2022-04-05 DIAGNOSIS — E860 Dehydration: Secondary | ICD-10-CM | POA: Diagnosis not present

## 2022-04-05 DIAGNOSIS — K5900 Constipation, unspecified: Secondary | ICD-10-CM | POA: Diagnosis not present

## 2022-04-05 DIAGNOSIS — E43 Unspecified severe protein-calorie malnutrition: Secondary | ICD-10-CM | POA: Diagnosis not present

## 2022-04-05 DIAGNOSIS — E559 Vitamin D deficiency, unspecified: Secondary | ICD-10-CM | POA: Diagnosis not present

## 2022-04-05 DIAGNOSIS — M800AXD Age-related osteoporosis with current pathological fracture, other site, subsequent encounter for fracture with routine healing: Secondary | ICD-10-CM | POA: Diagnosis not present

## 2022-04-05 DIAGNOSIS — J310 Chronic rhinitis: Secondary | ICD-10-CM | POA: Diagnosis not present

## 2022-04-05 DIAGNOSIS — D62 Acute posthemorrhagic anemia: Secondary | ICD-10-CM | POA: Diagnosis not present

## 2022-04-05 DIAGNOSIS — J302 Other seasonal allergic rhinitis: Secondary | ICD-10-CM | POA: Diagnosis not present

## 2022-04-05 DIAGNOSIS — Z9181 History of falling: Secondary | ICD-10-CM | POA: Diagnosis not present

## 2022-04-05 DIAGNOSIS — N1832 Chronic kidney disease, stage 3b: Secondary | ICD-10-CM | POA: Diagnosis not present

## 2022-04-05 DIAGNOSIS — N179 Acute kidney failure, unspecified: Secondary | ICD-10-CM | POA: Diagnosis not present

## 2022-04-05 DIAGNOSIS — D631 Anemia in chronic kidney disease: Secondary | ICD-10-CM | POA: Diagnosis not present

## 2022-04-05 DIAGNOSIS — K8044 Calculus of bile duct with chronic cholecystitis without obstruction: Secondary | ICD-10-CM | POA: Diagnosis not present

## 2022-04-05 DIAGNOSIS — G8929 Other chronic pain: Secondary | ICD-10-CM | POA: Diagnosis not present

## 2022-04-05 DIAGNOSIS — E538 Deficiency of other specified B group vitamins: Secondary | ICD-10-CM | POA: Diagnosis not present

## 2022-04-05 DIAGNOSIS — M5432 Sciatica, left side: Secondary | ICD-10-CM | POA: Diagnosis not present

## 2022-04-06 DIAGNOSIS — D62 Acute posthemorrhagic anemia: Secondary | ICD-10-CM | POA: Diagnosis not present

## 2022-04-06 DIAGNOSIS — D631 Anemia in chronic kidney disease: Secondary | ICD-10-CM | POA: Diagnosis not present

## 2022-04-06 DIAGNOSIS — J310 Chronic rhinitis: Secondary | ICD-10-CM | POA: Diagnosis not present

## 2022-04-06 DIAGNOSIS — K8044 Calculus of bile duct with chronic cholecystitis without obstruction: Secondary | ICD-10-CM | POA: Diagnosis not present

## 2022-04-06 DIAGNOSIS — N179 Acute kidney failure, unspecified: Secondary | ICD-10-CM | POA: Diagnosis not present

## 2022-04-06 DIAGNOSIS — K5900 Constipation, unspecified: Secondary | ICD-10-CM | POA: Diagnosis not present

## 2022-04-06 DIAGNOSIS — N1832 Chronic kidney disease, stage 3b: Secondary | ICD-10-CM | POA: Diagnosis not present

## 2022-04-06 DIAGNOSIS — Z9181 History of falling: Secondary | ICD-10-CM | POA: Diagnosis not present

## 2022-04-06 DIAGNOSIS — E538 Deficiency of other specified B group vitamins: Secondary | ICD-10-CM | POA: Diagnosis not present

## 2022-04-06 DIAGNOSIS — M5432 Sciatica, left side: Secondary | ICD-10-CM | POA: Diagnosis not present

## 2022-04-06 DIAGNOSIS — E559 Vitamin D deficiency, unspecified: Secondary | ICD-10-CM | POA: Diagnosis not present

## 2022-04-06 DIAGNOSIS — J302 Other seasonal allergic rhinitis: Secondary | ICD-10-CM | POA: Diagnosis not present

## 2022-04-06 DIAGNOSIS — G8929 Other chronic pain: Secondary | ICD-10-CM | POA: Diagnosis not present

## 2022-04-06 DIAGNOSIS — E860 Dehydration: Secondary | ICD-10-CM | POA: Diagnosis not present

## 2022-04-06 DIAGNOSIS — M800AXD Age-related osteoporosis with current pathological fracture, other site, subsequent encounter for fracture with routine healing: Secondary | ICD-10-CM | POA: Diagnosis not present

## 2022-04-06 DIAGNOSIS — E43 Unspecified severe protein-calorie malnutrition: Secondary | ICD-10-CM | POA: Diagnosis not present

## 2022-04-08 ENCOUNTER — Encounter (HOSPITAL_COMMUNITY)
Admission: RE | Admit: 2022-04-08 | Discharge: 2022-04-08 | Disposition: A | Discharge status: Home / Self Care | Payer: Medicare Other | Source: Ambulatory Visit | Attending: Nephrology | Admitting: Nephrology

## 2022-04-08 DIAGNOSIS — K8044 Calculus of bile duct with chronic cholecystitis without obstruction: Secondary | ICD-10-CM | POA: Diagnosis not present

## 2022-04-08 DIAGNOSIS — J310 Chronic rhinitis: Secondary | ICD-10-CM | POA: Diagnosis not present

## 2022-04-08 DIAGNOSIS — K5900 Constipation, unspecified: Secondary | ICD-10-CM | POA: Diagnosis not present

## 2022-04-08 DIAGNOSIS — M800AXD Age-related osteoporosis with current pathological fracture, other site, subsequent encounter for fracture with routine healing: Secondary | ICD-10-CM | POA: Diagnosis not present

## 2022-04-08 DIAGNOSIS — E43 Unspecified severe protein-calorie malnutrition: Secondary | ICD-10-CM | POA: Diagnosis not present

## 2022-04-08 DIAGNOSIS — E538 Deficiency of other specified B group vitamins: Secondary | ICD-10-CM | POA: Diagnosis not present

## 2022-04-08 DIAGNOSIS — E559 Vitamin D deficiency, unspecified: Secondary | ICD-10-CM | POA: Diagnosis not present

## 2022-04-08 DIAGNOSIS — M5432 Sciatica, left side: Secondary | ICD-10-CM | POA: Diagnosis not present

## 2022-04-08 DIAGNOSIS — N1832 Chronic kidney disease, stage 3b: Secondary | ICD-10-CM | POA: Insufficient documentation

## 2022-04-08 DIAGNOSIS — G8929 Other chronic pain: Secondary | ICD-10-CM | POA: Diagnosis not present

## 2022-04-08 DIAGNOSIS — D631 Anemia in chronic kidney disease: Secondary | ICD-10-CM | POA: Diagnosis not present

## 2022-04-08 DIAGNOSIS — J302 Other seasonal allergic rhinitis: Secondary | ICD-10-CM | POA: Diagnosis not present

## 2022-04-08 DIAGNOSIS — N179 Acute kidney failure, unspecified: Secondary | ICD-10-CM | POA: Diagnosis not present

## 2022-04-08 DIAGNOSIS — Z9181 History of falling: Secondary | ICD-10-CM | POA: Diagnosis not present

## 2022-04-08 DIAGNOSIS — S3282XD Multiple fractures of pelvis without disruption of pelvic ring, subsequent encounter for fracture with routine healing: Secondary | ICD-10-CM | POA: Diagnosis not present

## 2022-04-08 DIAGNOSIS — D62 Acute posthemorrhagic anemia: Secondary | ICD-10-CM | POA: Diagnosis not present

## 2022-04-08 DIAGNOSIS — E860 Dehydration: Secondary | ICD-10-CM | POA: Diagnosis not present

## 2022-04-08 MED ORDER — SODIUM CHLORIDE 0.9 % IV SOLN
510.0000 mg | INTRAVENOUS | Status: DC
  Administered 2022-04-08: 510 mg via INTRAVENOUS
  Filled 2022-04-08: qty 17

## 2022-04-09 DIAGNOSIS — S32512D Fracture of superior rim of left pubis, subsequent encounter for fracture with routine healing: Secondary | ICD-10-CM | POA: Diagnosis not present

## 2022-04-09 DIAGNOSIS — S32592D Other specified fracture of left pubis, subsequent encounter for fracture with routine healing: Secondary | ICD-10-CM | POA: Diagnosis not present

## 2022-04-12 ENCOUNTER — Ambulatory Visit (INDEPENDENT_AMBULATORY_CARE_PROVIDER_SITE_OTHER): Payer: Medicare Other | Admitting: Physician Assistant

## 2022-04-12 ENCOUNTER — Encounter: Payer: Self-pay | Admitting: Physician Assistant

## 2022-04-12 VITALS — BP 110/50 | HR 79 | Temp 97.7°F | Ht <= 58 in | Wt 85.0 lb

## 2022-04-12 DIAGNOSIS — J310 Chronic rhinitis: Secondary | ICD-10-CM | POA: Diagnosis not present

## 2022-04-12 DIAGNOSIS — K8044 Calculus of bile duct with chronic cholecystitis without obstruction: Secondary | ICD-10-CM | POA: Diagnosis not present

## 2022-04-12 DIAGNOSIS — M5432 Sciatica, left side: Secondary | ICD-10-CM | POA: Diagnosis not present

## 2022-04-12 DIAGNOSIS — S3282XD Multiple fractures of pelvis without disruption of pelvic ring, subsequent encounter for fracture with routine healing: Secondary | ICD-10-CM | POA: Diagnosis not present

## 2022-04-12 DIAGNOSIS — E538 Deficiency of other specified B group vitamins: Secondary | ICD-10-CM | POA: Diagnosis not present

## 2022-04-12 DIAGNOSIS — N1832 Chronic kidney disease, stage 3b: Secondary | ICD-10-CM | POA: Diagnosis not present

## 2022-04-12 DIAGNOSIS — L819 Disorder of pigmentation, unspecified: Secondary | ICD-10-CM

## 2022-04-12 DIAGNOSIS — E860 Dehydration: Secondary | ICD-10-CM | POA: Diagnosis not present

## 2022-04-12 DIAGNOSIS — J302 Other seasonal allergic rhinitis: Secondary | ICD-10-CM

## 2022-04-12 DIAGNOSIS — K5900 Constipation, unspecified: Secondary | ICD-10-CM | POA: Diagnosis not present

## 2022-04-12 DIAGNOSIS — M800AXD Age-related osteoporosis with current pathological fracture, other site, subsequent encounter for fracture with routine healing: Secondary | ICD-10-CM | POA: Diagnosis not present

## 2022-04-12 DIAGNOSIS — E559 Vitamin D deficiency, unspecified: Secondary | ICD-10-CM | POA: Diagnosis not present

## 2022-04-12 DIAGNOSIS — D631 Anemia in chronic kidney disease: Secondary | ICD-10-CM | POA: Diagnosis not present

## 2022-04-12 DIAGNOSIS — G8929 Other chronic pain: Secondary | ICD-10-CM | POA: Diagnosis not present

## 2022-04-12 DIAGNOSIS — D62 Acute posthemorrhagic anemia: Secondary | ICD-10-CM | POA: Diagnosis not present

## 2022-04-12 DIAGNOSIS — L299 Pruritus, unspecified: Secondary | ICD-10-CM

## 2022-04-12 DIAGNOSIS — Z9181 History of falling: Secondary | ICD-10-CM | POA: Diagnosis not present

## 2022-04-12 DIAGNOSIS — E43 Unspecified severe protein-calorie malnutrition: Secondary | ICD-10-CM | POA: Diagnosis not present

## 2022-04-12 DIAGNOSIS — N179 Acute kidney failure, unspecified: Secondary | ICD-10-CM | POA: Diagnosis not present

## 2022-04-12 MED ORDER — CALAMINE EX LOTN: 1.0000 "application " | TOPICAL_LOTION | CUTANEOUS | 0 refills | Status: DC | PRN

## 2022-04-12 MED ORDER — HYDROXYZINE HCL 10 MG PO TABS: 10.0000 mg | ORAL_TABLET | Freq: Three times a day (TID) | ORAL | 0 refills | Status: DC | PRN

## 2022-04-12 MED ORDER — TRIAMCINOLONE ACETONIDE 55 MCG/ACT NA AERO: INHALATION_SPRAY | NASAL | 1 refills | Status: DC

## 2022-04-12 NOTE — Patient Instructions (Signed)
Prurito ?Pruritus ?El prurito es una sensaci?n de picaz?n en la piel. Una de las causas m?s frecuentes es la sequedad de la piel, pero muchas cosas diferentes pueden causar picaz?n. La mayor?a de las causas de picaz?n no requieren atenci?n m?dica. A veces, la picaz?n en la piel puede convertirse en una erupci?n cut?nea. ?Siga estas instrucciones en su casa: ?Cuidado de la piel ? ?Apl?quese una loci?n humectante sobre la piel seg?n sea necesario. Las lociones que contienen vaselina son las mejores. ?[employer] o apl?quese las cremas con medicamento solamente como se lo haya indicado el m?dico. Pueden incluir: ?Crema con corticoesteroides. ?Lociones para Human resources officer picaz?n. ?Antihistam?nicos por v?a oral. ?Aplique un pa?o fr?o y h?medo (compresa fr?a) en la zona afectada. ?Tome ba?os de inmersi?n con uno de los siguientes: ?Sales de [place]. Puede conseguirlas en la tienda de [place] instrucciones del envase. ?Bicarbonato de sodio. Vierta un poco en la ba?era como se lo haya indicado el m?dico. ?Avena coloidal. Puede conseguirla en la tienda de comestibles o la farmacia local. [place] instrucciones del envase. ?Apl?quese una pasta de bicarbonato de [employer]. Para prepararla, mezcle un poco de agua con una peque?a cantidad de bicarbonato de sodio hasta alcanzar la consistencia de una pasta. ?No se rasque la piel. ?No tome ba?os de inmersi?n ni duchas calientes, ya que pueden empeorar la picaz?n. Una ducha de agua fr?a puede ayudar a Public house manager la picaz?n, siempre y cuando use un humectante despu?s de la ducha. ?No utilice jabones perfumados, detergentes, perfumes ni cosm?ticos. En lugar de eso, utilice versiones suaves y sin perfume de estos productos. ?Instrucciones generales ?Evite usar ropa [place]. ?Lleve un diario como ayuda para determinar la causa de la picaz?n. [place] los siguientes datos: ?Lo que usted come y bebe. ?Los cosm?ticos que [place]. ?Qu?  detergentes o Cristy Folks. ?Lo que lleva puesto, incluidas las joyas. ?Use un humidificador. Este [employer] humedad del aire, lo que ayuda a Risk analyst. ?Preste atenci?n a cualquier cambio en la picaz?n. ?Comun?quese con un m?dico si: ?La picaz?n no desaparece despu?s de varios d?as. ?Tiene una sed inusual u orina m?s de lo normal. ?Siente hormigueo o adormecimiento en la piel. ?Nota que la piel o la zona blanca de los ojos est?n amarillas (ictericia). ?Se siente d?bil. ?Tiene alguno de los siguientes s?ntomas: ?Sudoraci?n nocturna. ?Cansancio (fatiga). ?P?rdida de peso. ?Dolor abdominal. ?Resumen ?El prurito es una sensaci?n de picaz?n en la piel. Una de las causas m?s frecuentes es la sequedad de la piel, pero muchas afecciones y factores diferentes pueden causar picaz?n. ?Apl?quese una loci?n humectante sobre la piel seg?n sea necesario. Las lociones que contienen vaselina son las mejores. ?[employer] o apl?quese las cremas con medicamento solamente como se lo haya indicado el m?dico. ?No se duche ni tome ba?os de inmersi?n con agua caliente. No utilice jabones perfumados, detergentes, perfumes ni cosm?ticos. ?Esta informaci?n no tiene Marine scientist el consejo del m?dico. Aseg?rese de hacerle al m?dico cualquier pregunta que tenga. ?Document Revised: 10/21/2021 Document Reviewed: 10/21/2021 ?Elsevier Patient Education ? [place]. ? ?

## 2022-04-12 NOTE — Progress Notes (Signed)
?  Established patient acute visit ? ? ?Patient: Julia Kerr   DOB: May 03, 1923   88 y.o. Female  MRN: [id number] ?Visit Date: 04/12/2022 ? ?No chief complaint on file. ? ?Subjective  ?  ?HPI  ?Patient presents with c/o skin itching and burning all over her body which has been ongoing for at least 3 months but has been worse lately. Symptoms occur mostly at night. No rash or fever. Has been using aveeno cream with mild relief.  ? ? ? ?Medications: ?Outpatient Medications Prior to Visit  ?Medication Sig  ? Polyethylene Glycol 400 (VISINE DRY EYE RELIEF OP) Place 1 drop into both eyes as needed (dry eyes).  ? [DISCONTINUED] triamcinolone (NASACORT) 55 MCG/ACT AERO nasal inhaler Nasal Allergy 55 mcg spray aerosol ? PLACE 1 SPRAY INTO THE NOSE 2 (TWO) TIMES DAILY.  ? ?No facility-administered medications prior to visit.  ? ? ?Review of Systems ?Review of Systems:  ?A fourteen system review of systems was performed and found to be positive as per HPI. ? ? ? ?  Objective  ?  ?BP (!) 110/50   Pulse 79   Temp 97.7 ?F (36.5 ?C)   Ht '4\' 8"'$  (1.422 m)   Wt 85 lb (38.6 kg)   SpO2 99%   BMI 19.06 kg/m?  ? ? ?Physical Exam  ?General:  Well Developed, well nourished, appropriate for stated age.  ?Neuro:  Alert and oriented,  extra-ocular muscles intact  ?HEENT:  Normocephalic, atraumatic, neck supple  ?Skin:  no rash, warm, pink. Few bruises noted. Irregular raised brown lesion over left cheek ?Cardiac:  RRR, S1 S2 ?Respiratory: CTA B/L  ?Vascular:  Ext warm, no cyanosis apprec.; cap RF less 2 sec. ?Psych:  No HI/SI, judgement and insight good, Euthymic mood. Full Affect. ? ? ?No results found for any visits on 04/12/22. ? Assessment & Plan  ?  ? ?Patient has c/o chronic pruritis which is likely secondary to iron deficiency anemia and hepatobiliary disease. Advised to take hydroxyzine 10 mg as needed for moderate-severe itching and discussed cautious use due to drowsiness. Recommend to use calamine lotion. Pt and son verbalized  understanding. Will place dermatology referral for atypical skin lesion.  ? ? ?Return if symptoms worsen or fail to improve.  ?   ? ? ? ?[name], PA-C  ?[facility] ?[phone] (phone) ?[phone] (fax) ? ?[facility] ?

## 2022-04-15 ENCOUNTER — Encounter (HOSPITAL_COMMUNITY): Payer: Medicare Other

## 2022-04-15 ENCOUNTER — Encounter (HOSPITAL_COMMUNITY)
Admission: RE | Admit: 2022-04-15 | Discharge: 2022-04-15 | Disposition: A | Discharge status: Home / Self Care | Payer: Medicare Other | Source: Ambulatory Visit | Attending: Nephrology | Admitting: Nephrology

## 2022-04-15 VITALS — BP 115/45 | HR 79 | Temp 97.4°F | Resp 18 | Wt 85.0 lb

## 2022-04-15 DIAGNOSIS — E43 Unspecified severe protein-calorie malnutrition: Secondary | ICD-10-CM | POA: Diagnosis not present

## 2022-04-15 DIAGNOSIS — M5432 Sciatica, left side: Secondary | ICD-10-CM | POA: Diagnosis not present

## 2022-04-15 DIAGNOSIS — N179 Acute kidney failure, unspecified: Secondary | ICD-10-CM | POA: Diagnosis not present

## 2022-04-15 DIAGNOSIS — K5900 Constipation, unspecified: Secondary | ICD-10-CM | POA: Diagnosis not present

## 2022-04-15 DIAGNOSIS — N1832 Chronic kidney disease, stage 3b: Secondary | ICD-10-CM

## 2022-04-15 DIAGNOSIS — M800AXD Age-related osteoporosis with current pathological fracture, other site, subsequent encounter for fracture with routine healing: Secondary | ICD-10-CM | POA: Diagnosis not present

## 2022-04-15 DIAGNOSIS — E538 Deficiency of other specified B group vitamins: Secondary | ICD-10-CM | POA: Diagnosis not present

## 2022-04-15 DIAGNOSIS — K8044 Calculus of bile duct with chronic cholecystitis without obstruction: Secondary | ICD-10-CM | POA: Diagnosis not present

## 2022-04-15 DIAGNOSIS — J310 Chronic rhinitis: Secondary | ICD-10-CM | POA: Diagnosis not present

## 2022-04-15 DIAGNOSIS — E559 Vitamin D deficiency, unspecified: Secondary | ICD-10-CM | POA: Diagnosis not present

## 2022-04-15 DIAGNOSIS — J302 Other seasonal allergic rhinitis: Secondary | ICD-10-CM | POA: Diagnosis not present

## 2022-04-15 DIAGNOSIS — D631 Anemia in chronic kidney disease: Secondary | ICD-10-CM | POA: Diagnosis not present

## 2022-04-15 DIAGNOSIS — G8929 Other chronic pain: Secondary | ICD-10-CM | POA: Diagnosis not present

## 2022-04-15 DIAGNOSIS — S3282XD Multiple fractures of pelvis without disruption of pelvic ring, subsequent encounter for fracture with routine healing: Secondary | ICD-10-CM | POA: Diagnosis not present

## 2022-04-15 DIAGNOSIS — Z9181 History of falling: Secondary | ICD-10-CM | POA: Diagnosis not present

## 2022-04-15 DIAGNOSIS — D62 Acute posthemorrhagic anemia: Secondary | ICD-10-CM | POA: Diagnosis not present

## 2022-04-15 DIAGNOSIS — E860 Dehydration: Secondary | ICD-10-CM | POA: Diagnosis not present

## 2022-04-15 LAB — POCT HEMOGLOBIN-HEMACUE: Hemoglobin: 9 g/dL — ABNORMAL LOW (ref 12.0–15.0)

## 2022-04-15 MED ORDER — SODIUM CHLORIDE 0.9 % IV SOLN
510.0000 mg | INTRAVENOUS | Status: DC
  Administered 2022-04-15: 510 mg via INTRAVENOUS
  Filled 2022-04-15: qty 510

## 2022-04-15 MED ORDER — EPOETIN ALFA-EPBX 10000 UNIT/ML IJ SOLN
INTRAMUSCULAR | Status: AC
  Filled 2022-04-15: qty 2

## 2022-04-15 MED ORDER — EPOETIN ALFA-EPBX 10000 UNIT/ML IJ SOLN
20000.0000 [IU] | INTRAMUSCULAR | Status: DC
  Administered 2022-04-15: 20000 [IU] via SUBCUTANEOUS

## 2022-04-16 DIAGNOSIS — Z9181 History of falling: Secondary | ICD-10-CM | POA: Diagnosis not present

## 2022-04-16 DIAGNOSIS — K8044 Calculus of bile duct with chronic cholecystitis without obstruction: Secondary | ICD-10-CM | POA: Diagnosis not present

## 2022-04-16 DIAGNOSIS — G8929 Other chronic pain: Secondary | ICD-10-CM | POA: Diagnosis not present

## 2022-04-16 DIAGNOSIS — S3282XD Multiple fractures of pelvis without disruption of pelvic ring, subsequent encounter for fracture with routine healing: Secondary | ICD-10-CM | POA: Diagnosis not present

## 2022-04-16 DIAGNOSIS — D631 Anemia in chronic kidney disease: Secondary | ICD-10-CM | POA: Diagnosis not present

## 2022-04-16 DIAGNOSIS — E43 Unspecified severe protein-calorie malnutrition: Secondary | ICD-10-CM | POA: Diagnosis not present

## 2022-04-16 DIAGNOSIS — E538 Deficiency of other specified B group vitamins: Secondary | ICD-10-CM | POA: Diagnosis not present

## 2022-04-16 DIAGNOSIS — J302 Other seasonal allergic rhinitis: Secondary | ICD-10-CM | POA: Diagnosis not present

## 2022-04-16 DIAGNOSIS — D62 Acute posthemorrhagic anemia: Secondary | ICD-10-CM | POA: Diagnosis not present

## 2022-04-16 DIAGNOSIS — M800AXD Age-related osteoporosis with current pathological fracture, other site, subsequent encounter for fracture with routine healing: Secondary | ICD-10-CM | POA: Diagnosis not present

## 2022-04-16 DIAGNOSIS — J310 Chronic rhinitis: Secondary | ICD-10-CM | POA: Diagnosis not present

## 2022-04-16 DIAGNOSIS — E559 Vitamin D deficiency, unspecified: Secondary | ICD-10-CM | POA: Diagnosis not present

## 2022-04-16 DIAGNOSIS — M5432 Sciatica, left side: Secondary | ICD-10-CM | POA: Diagnosis not present

## 2022-04-16 DIAGNOSIS — N1832 Chronic kidney disease, stage 3b: Secondary | ICD-10-CM | POA: Diagnosis not present

## 2022-04-16 DIAGNOSIS — K5900 Constipation, unspecified: Secondary | ICD-10-CM | POA: Diagnosis not present

## 2022-04-16 DIAGNOSIS — N179 Acute kidney failure, unspecified: Secondary | ICD-10-CM | POA: Diagnosis not present

## 2022-04-16 DIAGNOSIS — E860 Dehydration: Secondary | ICD-10-CM | POA: Diagnosis not present

## 2022-04-20 DIAGNOSIS — E538 Deficiency of other specified B group vitamins: Secondary | ICD-10-CM | POA: Diagnosis not present

## 2022-04-20 DIAGNOSIS — E559 Vitamin D deficiency, unspecified: Secondary | ICD-10-CM | POA: Diagnosis not present

## 2022-04-20 DIAGNOSIS — E43 Unspecified severe protein-calorie malnutrition: Secondary | ICD-10-CM | POA: Diagnosis not present

## 2022-04-20 DIAGNOSIS — K5900 Constipation, unspecified: Secondary | ICD-10-CM | POA: Diagnosis not present

## 2022-04-20 DIAGNOSIS — D631 Anemia in chronic kidney disease: Secondary | ICD-10-CM | POA: Diagnosis not present

## 2022-04-20 DIAGNOSIS — S3282XD Multiple fractures of pelvis without disruption of pelvic ring, subsequent encounter for fracture with routine healing: Secondary | ICD-10-CM | POA: Diagnosis not present

## 2022-04-20 DIAGNOSIS — Z9181 History of falling: Secondary | ICD-10-CM | POA: Diagnosis not present

## 2022-04-20 DIAGNOSIS — E860 Dehydration: Secondary | ICD-10-CM | POA: Diagnosis not present

## 2022-04-20 DIAGNOSIS — K8044 Calculus of bile duct with chronic cholecystitis without obstruction: Secondary | ICD-10-CM | POA: Diagnosis not present

## 2022-04-20 DIAGNOSIS — N179 Acute kidney failure, unspecified: Secondary | ICD-10-CM | POA: Diagnosis not present

## 2022-04-20 DIAGNOSIS — J310 Chronic rhinitis: Secondary | ICD-10-CM | POA: Diagnosis not present

## 2022-04-20 DIAGNOSIS — G8929 Other chronic pain: Secondary | ICD-10-CM | POA: Diagnosis not present

## 2022-04-20 DIAGNOSIS — M800AXD Age-related osteoporosis with current pathological fracture, other site, subsequent encounter for fracture with routine healing: Secondary | ICD-10-CM | POA: Diagnosis not present

## 2022-04-20 DIAGNOSIS — M5432 Sciatica, left side: Secondary | ICD-10-CM | POA: Diagnosis not present

## 2022-04-20 DIAGNOSIS — D62 Acute posthemorrhagic anemia: Secondary | ICD-10-CM | POA: Diagnosis not present

## 2022-04-20 DIAGNOSIS — J302 Other seasonal allergic rhinitis: Secondary | ICD-10-CM | POA: Diagnosis not present

## 2022-04-20 DIAGNOSIS — N1832 Chronic kidney disease, stage 3b: Secondary | ICD-10-CM | POA: Diagnosis not present

## 2022-04-23 DIAGNOSIS — D631 Anemia in chronic kidney disease: Secondary | ICD-10-CM | POA: Diagnosis not present

## 2022-04-23 DIAGNOSIS — D62 Acute posthemorrhagic anemia: Secondary | ICD-10-CM | POA: Diagnosis not present

## 2022-04-23 DIAGNOSIS — J302 Other seasonal allergic rhinitis: Secondary | ICD-10-CM | POA: Diagnosis not present

## 2022-04-23 DIAGNOSIS — Z9181 History of falling: Secondary | ICD-10-CM | POA: Diagnosis not present

## 2022-04-23 DIAGNOSIS — E538 Deficiency of other specified B group vitamins: Secondary | ICD-10-CM | POA: Diagnosis not present

## 2022-04-23 DIAGNOSIS — G8929 Other chronic pain: Secondary | ICD-10-CM | POA: Diagnosis not present

## 2022-04-23 DIAGNOSIS — M5432 Sciatica, left side: Secondary | ICD-10-CM | POA: Diagnosis not present

## 2022-04-23 DIAGNOSIS — E43 Unspecified severe protein-calorie malnutrition: Secondary | ICD-10-CM | POA: Diagnosis not present

## 2022-04-23 DIAGNOSIS — E860 Dehydration: Secondary | ICD-10-CM | POA: Diagnosis not present

## 2022-04-23 DIAGNOSIS — E559 Vitamin D deficiency, unspecified: Secondary | ICD-10-CM | POA: Diagnosis not present

## 2022-04-23 DIAGNOSIS — K5900 Constipation, unspecified: Secondary | ICD-10-CM | POA: Diagnosis not present

## 2022-04-23 DIAGNOSIS — N179 Acute kidney failure, unspecified: Secondary | ICD-10-CM | POA: Diagnosis not present

## 2022-04-23 DIAGNOSIS — S3282XD Multiple fractures of pelvis without disruption of pelvic ring, subsequent encounter for fracture with routine healing: Secondary | ICD-10-CM | POA: Diagnosis not present

## 2022-04-23 DIAGNOSIS — J310 Chronic rhinitis: Secondary | ICD-10-CM | POA: Diagnosis not present

## 2022-04-23 DIAGNOSIS — M800AXD Age-related osteoporosis with current pathological fracture, other site, subsequent encounter for fracture with routine healing: Secondary | ICD-10-CM | POA: Diagnosis not present

## 2022-04-23 DIAGNOSIS — N1832 Chronic kidney disease, stage 3b: Secondary | ICD-10-CM | POA: Diagnosis not present

## 2022-04-23 DIAGNOSIS — K8044 Calculus of bile duct with chronic cholecystitis without obstruction: Secondary | ICD-10-CM | POA: Diagnosis not present

## 2022-04-26 DIAGNOSIS — E559 Vitamin D deficiency, unspecified: Secondary | ICD-10-CM | POA: Diagnosis not present

## 2022-04-26 DIAGNOSIS — J302 Other seasonal allergic rhinitis: Secondary | ICD-10-CM | POA: Diagnosis not present

## 2022-04-26 DIAGNOSIS — Z9181 History of falling: Secondary | ICD-10-CM | POA: Diagnosis not present

## 2022-04-26 DIAGNOSIS — N1832 Chronic kidney disease, stage 3b: Secondary | ICD-10-CM | POA: Diagnosis not present

## 2022-04-26 DIAGNOSIS — G8929 Other chronic pain: Secondary | ICD-10-CM | POA: Diagnosis not present

## 2022-04-26 DIAGNOSIS — D62 Acute posthemorrhagic anemia: Secondary | ICD-10-CM | POA: Diagnosis not present

## 2022-04-26 DIAGNOSIS — K8044 Calculus of bile duct with chronic cholecystitis without obstruction: Secondary | ICD-10-CM | POA: Diagnosis not present

## 2022-04-26 DIAGNOSIS — K5900 Constipation, unspecified: Secondary | ICD-10-CM | POA: Diagnosis not present

## 2022-04-26 DIAGNOSIS — E860 Dehydration: Secondary | ICD-10-CM | POA: Diagnosis not present

## 2022-04-26 DIAGNOSIS — M800AXD Age-related osteoporosis with current pathological fracture, other site, subsequent encounter for fracture with routine healing: Secondary | ICD-10-CM | POA: Diagnosis not present

## 2022-04-26 DIAGNOSIS — D631 Anemia in chronic kidney disease: Secondary | ICD-10-CM | POA: Diagnosis not present

## 2022-04-26 DIAGNOSIS — E43 Unspecified severe protein-calorie malnutrition: Secondary | ICD-10-CM | POA: Diagnosis not present

## 2022-04-26 DIAGNOSIS — N179 Acute kidney failure, unspecified: Secondary | ICD-10-CM | POA: Diagnosis not present

## 2022-04-26 DIAGNOSIS — E538 Deficiency of other specified B group vitamins: Secondary | ICD-10-CM | POA: Diagnosis not present

## 2022-04-26 DIAGNOSIS — S3282XD Multiple fractures of pelvis without disruption of pelvic ring, subsequent encounter for fracture with routine healing: Secondary | ICD-10-CM | POA: Diagnosis not present

## 2022-04-26 DIAGNOSIS — J310 Chronic rhinitis: Secondary | ICD-10-CM | POA: Diagnosis not present

## 2022-04-26 DIAGNOSIS — M5432 Sciatica, left side: Secondary | ICD-10-CM | POA: Diagnosis not present

## 2022-04-29 ENCOUNTER — Encounter (HOSPITAL_COMMUNITY): Payer: Self-pay

## 2022-04-29 ENCOUNTER — Encounter (HOSPITAL_COMMUNITY)
Admission: RE | Admit: 2022-04-29 | Discharge: 2022-04-29 | Disposition: A | Discharge status: Home / Self Care | Payer: Medicare Other | Source: Ambulatory Visit | Attending: Nephrology | Admitting: Nephrology

## 2022-04-29 VITALS — BP 119/50 | HR 75 | Temp 98.4°F | Resp 16

## 2022-04-29 DIAGNOSIS — N1832 Chronic kidney disease, stage 3b: Secondary | ICD-10-CM | POA: Insufficient documentation

## 2022-04-29 DIAGNOSIS — G8929 Other chronic pain: Secondary | ICD-10-CM | POA: Diagnosis not present

## 2022-04-29 DIAGNOSIS — E43 Unspecified severe protein-calorie malnutrition: Secondary | ICD-10-CM | POA: Diagnosis not present

## 2022-04-29 DIAGNOSIS — E860 Dehydration: Secondary | ICD-10-CM | POA: Diagnosis not present

## 2022-04-29 DIAGNOSIS — M800AXD Age-related osteoporosis with current pathological fracture, other site, subsequent encounter for fracture with routine healing: Secondary | ICD-10-CM | POA: Diagnosis not present

## 2022-04-29 DIAGNOSIS — E538 Deficiency of other specified B group vitamins: Secondary | ICD-10-CM | POA: Diagnosis not present

## 2022-04-29 DIAGNOSIS — D62 Acute posthemorrhagic anemia: Secondary | ICD-10-CM | POA: Diagnosis not present

## 2022-04-29 DIAGNOSIS — N179 Acute kidney failure, unspecified: Secondary | ICD-10-CM | POA: Diagnosis not present

## 2022-04-29 DIAGNOSIS — K8044 Calculus of bile duct with chronic cholecystitis without obstruction: Secondary | ICD-10-CM | POA: Diagnosis not present

## 2022-04-29 DIAGNOSIS — M5432 Sciatica, left side: Secondary | ICD-10-CM | POA: Diagnosis not present

## 2022-04-29 DIAGNOSIS — K5900 Constipation, unspecified: Secondary | ICD-10-CM | POA: Diagnosis not present

## 2022-04-29 DIAGNOSIS — D631 Anemia in chronic kidney disease: Secondary | ICD-10-CM | POA: Diagnosis not present

## 2022-04-29 DIAGNOSIS — S3282XD Multiple fractures of pelvis without disruption of pelvic ring, subsequent encounter for fracture with routine healing: Secondary | ICD-10-CM | POA: Diagnosis not present

## 2022-04-29 DIAGNOSIS — Z9181 History of falling: Secondary | ICD-10-CM | POA: Diagnosis not present

## 2022-04-29 DIAGNOSIS — E559 Vitamin D deficiency, unspecified: Secondary | ICD-10-CM | POA: Diagnosis not present

## 2022-04-29 DIAGNOSIS — J302 Other seasonal allergic rhinitis: Secondary | ICD-10-CM | POA: Diagnosis not present

## 2022-04-29 DIAGNOSIS — J310 Chronic rhinitis: Secondary | ICD-10-CM | POA: Diagnosis not present

## 2022-04-29 LAB — IRON AND TIBC
Iron: 173 ug/dL — ABNORMAL HIGH (ref 28–170)
Saturation Ratios: 88 % — ABNORMAL HIGH (ref 10.4–31.8)
TIBC: 197 ug/dL — ABNORMAL LOW (ref 250–450)
UIBC: 24 ug/dL

## 2022-04-29 LAB — FERRITIN: Ferritin: 2010 ng/mL — ABNORMAL HIGH (ref 11–307)

## 2022-04-29 LAB — POCT HEMOGLOBIN-HEMACUE: Hemoglobin: 9.1 g/dL — ABNORMAL LOW (ref 12.0–15.0)

## 2022-04-29 MED ORDER — EPOETIN ALFA-EPBX 10000 UNIT/ML IJ SOLN
20000.0000 [IU] | INTRAMUSCULAR | Status: DC
  Administered 2022-04-29: 20000 [IU] via SUBCUTANEOUS

## 2022-04-29 MED ORDER — EPOETIN ALFA-EPBX 10000 UNIT/ML IJ SOLN
INTRAMUSCULAR | Status: AC
  Filled 2022-04-29: qty 2

## 2022-04-30 DIAGNOSIS — Z9181 History of falling: Secondary | ICD-10-CM | POA: Diagnosis not present

## 2022-04-30 DIAGNOSIS — N179 Acute kidney failure, unspecified: Secondary | ICD-10-CM | POA: Diagnosis not present

## 2022-04-30 DIAGNOSIS — G8929 Other chronic pain: Secondary | ICD-10-CM | POA: Diagnosis not present

## 2022-04-30 DIAGNOSIS — J310 Chronic rhinitis: Secondary | ICD-10-CM | POA: Diagnosis not present

## 2022-04-30 DIAGNOSIS — M800AXD Age-related osteoporosis with current pathological fracture, other site, subsequent encounter for fracture with routine healing: Secondary | ICD-10-CM | POA: Diagnosis not present

## 2022-04-30 DIAGNOSIS — D631 Anemia in chronic kidney disease: Secondary | ICD-10-CM | POA: Diagnosis not present

## 2022-04-30 DIAGNOSIS — S3282XD Multiple fractures of pelvis without disruption of pelvic ring, subsequent encounter for fracture with routine healing: Secondary | ICD-10-CM | POA: Diagnosis not present

## 2022-04-30 DIAGNOSIS — N1832 Chronic kidney disease, stage 3b: Secondary | ICD-10-CM | POA: Diagnosis not present

## 2022-04-30 DIAGNOSIS — K5900 Constipation, unspecified: Secondary | ICD-10-CM | POA: Diagnosis not present

## 2022-04-30 DIAGNOSIS — J302 Other seasonal allergic rhinitis: Secondary | ICD-10-CM | POA: Diagnosis not present

## 2022-04-30 DIAGNOSIS — K8044 Calculus of bile duct with chronic cholecystitis without obstruction: Secondary | ICD-10-CM | POA: Diagnosis not present

## 2022-04-30 DIAGNOSIS — E860 Dehydration: Secondary | ICD-10-CM | POA: Diagnosis not present

## 2022-04-30 DIAGNOSIS — D62 Acute posthemorrhagic anemia: Secondary | ICD-10-CM | POA: Diagnosis not present

## 2022-04-30 DIAGNOSIS — E559 Vitamin D deficiency, unspecified: Secondary | ICD-10-CM | POA: Diagnosis not present

## 2022-04-30 DIAGNOSIS — E43 Unspecified severe protein-calorie malnutrition: Secondary | ICD-10-CM | POA: Diagnosis not present

## 2022-04-30 DIAGNOSIS — M5432 Sciatica, left side: Secondary | ICD-10-CM | POA: Diagnosis not present

## 2022-04-30 DIAGNOSIS — E538 Deficiency of other specified B group vitamins: Secondary | ICD-10-CM | POA: Diagnosis not present

## 2022-05-03 DIAGNOSIS — N1832 Chronic kidney disease, stage 3b: Secondary | ICD-10-CM | POA: Diagnosis not present

## 2022-05-03 DIAGNOSIS — D62 Acute posthemorrhagic anemia: Secondary | ICD-10-CM | POA: Diagnosis not present

## 2022-05-03 DIAGNOSIS — J302 Other seasonal allergic rhinitis: Secondary | ICD-10-CM | POA: Diagnosis not present

## 2022-05-03 DIAGNOSIS — E538 Deficiency of other specified B group vitamins: Secondary | ICD-10-CM | POA: Diagnosis not present

## 2022-05-03 DIAGNOSIS — D631 Anemia in chronic kidney disease: Secondary | ICD-10-CM | POA: Diagnosis not present

## 2022-05-03 DIAGNOSIS — K8044 Calculus of bile duct with chronic cholecystitis without obstruction: Secondary | ICD-10-CM | POA: Diagnosis not present

## 2022-05-03 DIAGNOSIS — M800AXD Age-related osteoporosis with current pathological fracture, other site, subsequent encounter for fracture with routine healing: Secondary | ICD-10-CM | POA: Diagnosis not present

## 2022-05-03 DIAGNOSIS — E43 Unspecified severe protein-calorie malnutrition: Secondary | ICD-10-CM | POA: Diagnosis not present

## 2022-05-03 DIAGNOSIS — K5900 Constipation, unspecified: Secondary | ICD-10-CM | POA: Diagnosis not present

## 2022-05-03 DIAGNOSIS — J310 Chronic rhinitis: Secondary | ICD-10-CM | POA: Diagnosis not present

## 2022-05-03 DIAGNOSIS — Z9181 History of falling: Secondary | ICD-10-CM | POA: Diagnosis not present

## 2022-05-03 DIAGNOSIS — S3282XD Multiple fractures of pelvis without disruption of pelvic ring, subsequent encounter for fracture with routine healing: Secondary | ICD-10-CM | POA: Diagnosis not present

## 2022-05-03 DIAGNOSIS — E860 Dehydration: Secondary | ICD-10-CM | POA: Diagnosis not present

## 2022-05-03 DIAGNOSIS — N179 Acute kidney failure, unspecified: Secondary | ICD-10-CM | POA: Diagnosis not present

## 2022-05-03 DIAGNOSIS — M5432 Sciatica, left side: Secondary | ICD-10-CM | POA: Diagnosis not present

## 2022-05-03 DIAGNOSIS — E559 Vitamin D deficiency, unspecified: Secondary | ICD-10-CM | POA: Diagnosis not present

## 2022-05-03 DIAGNOSIS — G8929 Other chronic pain: Secondary | ICD-10-CM | POA: Diagnosis not present

## 2022-05-04 DIAGNOSIS — M800AXD Age-related osteoporosis with current pathological fracture, other site, subsequent encounter for fracture with routine healing: Secondary | ICD-10-CM | POA: Diagnosis not present

## 2022-05-04 DIAGNOSIS — D62 Acute posthemorrhagic anemia: Secondary | ICD-10-CM | POA: Diagnosis not present

## 2022-05-04 DIAGNOSIS — K8044 Calculus of bile duct with chronic cholecystitis without obstruction: Secondary | ICD-10-CM | POA: Diagnosis not present

## 2022-05-04 DIAGNOSIS — D631 Anemia in chronic kidney disease: Secondary | ICD-10-CM | POA: Diagnosis not present

## 2022-05-04 DIAGNOSIS — E559 Vitamin D deficiency, unspecified: Secondary | ICD-10-CM | POA: Diagnosis not present

## 2022-05-04 DIAGNOSIS — M5432 Sciatica, left side: Secondary | ICD-10-CM | POA: Diagnosis not present

## 2022-05-04 DIAGNOSIS — J310 Chronic rhinitis: Secondary | ICD-10-CM | POA: Diagnosis not present

## 2022-05-04 DIAGNOSIS — Z9181 History of falling: Secondary | ICD-10-CM | POA: Diagnosis not present

## 2022-05-04 DIAGNOSIS — J302 Other seasonal allergic rhinitis: Secondary | ICD-10-CM | POA: Diagnosis not present

## 2022-05-04 DIAGNOSIS — S3282XD Multiple fractures of pelvis without disruption of pelvic ring, subsequent encounter for fracture with routine healing: Secondary | ICD-10-CM | POA: Diagnosis not present

## 2022-05-04 DIAGNOSIS — N179 Acute kidney failure, unspecified: Secondary | ICD-10-CM | POA: Diagnosis not present

## 2022-05-04 DIAGNOSIS — K5900 Constipation, unspecified: Secondary | ICD-10-CM | POA: Diagnosis not present

## 2022-05-04 DIAGNOSIS — E43 Unspecified severe protein-calorie malnutrition: Secondary | ICD-10-CM | POA: Diagnosis not present

## 2022-05-04 DIAGNOSIS — E538 Deficiency of other specified B group vitamins: Secondary | ICD-10-CM | POA: Diagnosis not present

## 2022-05-04 DIAGNOSIS — N1832 Chronic kidney disease, stage 3b: Secondary | ICD-10-CM | POA: Diagnosis not present

## 2022-05-04 DIAGNOSIS — E860 Dehydration: Secondary | ICD-10-CM | POA: Diagnosis not present

## 2022-05-04 DIAGNOSIS — G8929 Other chronic pain: Secondary | ICD-10-CM | POA: Diagnosis not present

## 2022-05-06 DIAGNOSIS — E860 Dehydration: Secondary | ICD-10-CM | POA: Diagnosis not present

## 2022-05-06 DIAGNOSIS — D62 Acute posthemorrhagic anemia: Secondary | ICD-10-CM | POA: Diagnosis not present

## 2022-05-06 DIAGNOSIS — M5432 Sciatica, left side: Secondary | ICD-10-CM | POA: Diagnosis not present

## 2022-05-06 DIAGNOSIS — J310 Chronic rhinitis: Secondary | ICD-10-CM | POA: Diagnosis not present

## 2022-05-06 DIAGNOSIS — G8929 Other chronic pain: Secondary | ICD-10-CM | POA: Diagnosis not present

## 2022-05-06 DIAGNOSIS — E538 Deficiency of other specified B group vitamins: Secondary | ICD-10-CM | POA: Diagnosis not present

## 2022-05-06 DIAGNOSIS — D631 Anemia in chronic kidney disease: Secondary | ICD-10-CM | POA: Diagnosis not present

## 2022-05-06 DIAGNOSIS — K5900 Constipation, unspecified: Secondary | ICD-10-CM | POA: Diagnosis not present

## 2022-05-06 DIAGNOSIS — E559 Vitamin D deficiency, unspecified: Secondary | ICD-10-CM | POA: Diagnosis not present

## 2022-05-06 DIAGNOSIS — J302 Other seasonal allergic rhinitis: Secondary | ICD-10-CM | POA: Diagnosis not present

## 2022-05-06 DIAGNOSIS — K8044 Calculus of bile duct with chronic cholecystitis without obstruction: Secondary | ICD-10-CM | POA: Diagnosis not present

## 2022-05-06 DIAGNOSIS — S3282XD Multiple fractures of pelvis without disruption of pelvic ring, subsequent encounter for fracture with routine healing: Secondary | ICD-10-CM | POA: Diagnosis not present

## 2022-05-06 DIAGNOSIS — N1832 Chronic kidney disease, stage 3b: Secondary | ICD-10-CM | POA: Diagnosis not present

## 2022-05-06 DIAGNOSIS — Z9181 History of falling: Secondary | ICD-10-CM | POA: Diagnosis not present

## 2022-05-06 DIAGNOSIS — E43 Unspecified severe protein-calorie malnutrition: Secondary | ICD-10-CM | POA: Diagnosis not present

## 2022-05-06 DIAGNOSIS — N179 Acute kidney failure, unspecified: Secondary | ICD-10-CM | POA: Diagnosis not present

## 2022-05-06 DIAGNOSIS — M800AXD Age-related osteoporosis with current pathological fracture, other site, subsequent encounter for fracture with routine healing: Secondary | ICD-10-CM | POA: Diagnosis not present

## 2022-05-10 DIAGNOSIS — J310 Chronic rhinitis: Secondary | ICD-10-CM | POA: Diagnosis not present

## 2022-05-10 DIAGNOSIS — G8929 Other chronic pain: Secondary | ICD-10-CM | POA: Diagnosis not present

## 2022-05-10 DIAGNOSIS — M800AXD Age-related osteoporosis with current pathological fracture, other site, subsequent encounter for fracture with routine healing: Secondary | ICD-10-CM | POA: Diagnosis not present

## 2022-05-10 DIAGNOSIS — K8044 Calculus of bile duct with chronic cholecystitis without obstruction: Secondary | ICD-10-CM | POA: Diagnosis not present

## 2022-05-10 DIAGNOSIS — D631 Anemia in chronic kidney disease: Secondary | ICD-10-CM | POA: Diagnosis not present

## 2022-05-10 DIAGNOSIS — S3282XD Multiple fractures of pelvis without disruption of pelvic ring, subsequent encounter for fracture with routine healing: Secondary | ICD-10-CM | POA: Diagnosis not present

## 2022-05-10 DIAGNOSIS — D62 Acute posthemorrhagic anemia: Secondary | ICD-10-CM | POA: Diagnosis not present

## 2022-05-10 DIAGNOSIS — E538 Deficiency of other specified B group vitamins: Secondary | ICD-10-CM | POA: Diagnosis not present

## 2022-05-10 DIAGNOSIS — N1832 Chronic kidney disease, stage 3b: Secondary | ICD-10-CM | POA: Diagnosis not present

## 2022-05-10 DIAGNOSIS — N179 Acute kidney failure, unspecified: Secondary | ICD-10-CM | POA: Diagnosis not present

## 2022-05-10 DIAGNOSIS — E860 Dehydration: Secondary | ICD-10-CM | POA: Diagnosis not present

## 2022-05-10 DIAGNOSIS — K5900 Constipation, unspecified: Secondary | ICD-10-CM | POA: Diagnosis not present

## 2022-05-10 DIAGNOSIS — E43 Unspecified severe protein-calorie malnutrition: Secondary | ICD-10-CM | POA: Diagnosis not present

## 2022-05-10 DIAGNOSIS — J302 Other seasonal allergic rhinitis: Secondary | ICD-10-CM | POA: Diagnosis not present

## 2022-05-10 DIAGNOSIS — Z9181 History of falling: Secondary | ICD-10-CM | POA: Diagnosis not present

## 2022-05-10 DIAGNOSIS — M5432 Sciatica, left side: Secondary | ICD-10-CM | POA: Diagnosis not present

## 2022-05-10 DIAGNOSIS — E559 Vitamin D deficiency, unspecified: Secondary | ICD-10-CM | POA: Diagnosis not present

## 2022-05-13 ENCOUNTER — Encounter (HOSPITAL_COMMUNITY): Payer: Self-pay

## 2022-05-13 ENCOUNTER — Encounter (HOSPITAL_COMMUNITY)
Admission: RE | Admit: 2022-05-13 | Discharge: 2022-05-13 | Disposition: A | Discharge status: Home / Self Care | Payer: Medicare Other | Source: Ambulatory Visit | Attending: Nephrology | Admitting: Nephrology

## 2022-05-13 ENCOUNTER — Encounter (HOSPITAL_COMMUNITY): Payer: Medicare Other

## 2022-05-13 VITALS — BP 115/51 | HR 78 | Temp 97.3°F | Resp 18

## 2022-05-13 DIAGNOSIS — N1832 Chronic kidney disease, stage 3b: Secondary | ICD-10-CM | POA: Diagnosis not present

## 2022-05-13 DIAGNOSIS — K5900 Constipation, unspecified: Secondary | ICD-10-CM | POA: Diagnosis not present

## 2022-05-13 DIAGNOSIS — G8929 Other chronic pain: Secondary | ICD-10-CM | POA: Diagnosis not present

## 2022-05-13 DIAGNOSIS — K8044 Calculus of bile duct with chronic cholecystitis without obstruction: Secondary | ICD-10-CM | POA: Diagnosis not present

## 2022-05-13 DIAGNOSIS — E559 Vitamin D deficiency, unspecified: Secondary | ICD-10-CM | POA: Diagnosis not present

## 2022-05-13 DIAGNOSIS — E860 Dehydration: Secondary | ICD-10-CM | POA: Diagnosis not present

## 2022-05-13 DIAGNOSIS — Z9181 History of falling: Secondary | ICD-10-CM | POA: Diagnosis not present

## 2022-05-13 DIAGNOSIS — D62 Acute posthemorrhagic anemia: Secondary | ICD-10-CM | POA: Diagnosis not present

## 2022-05-13 DIAGNOSIS — H04123 Dry eye syndrome of bilateral lacrimal glands: Secondary | ICD-10-CM | POA: Diagnosis not present

## 2022-05-13 DIAGNOSIS — D631 Anemia in chronic kidney disease: Secondary | ICD-10-CM | POA: Diagnosis not present

## 2022-05-13 DIAGNOSIS — S3282XD Multiple fractures of pelvis without disruption of pelvic ring, subsequent encounter for fracture with routine healing: Secondary | ICD-10-CM | POA: Diagnosis not present

## 2022-05-13 DIAGNOSIS — M800AXD Age-related osteoporosis with current pathological fracture, other site, subsequent encounter for fracture with routine healing: Secondary | ICD-10-CM | POA: Diagnosis not present

## 2022-05-13 DIAGNOSIS — J310 Chronic rhinitis: Secondary | ICD-10-CM | POA: Diagnosis not present

## 2022-05-13 DIAGNOSIS — J302 Other seasonal allergic rhinitis: Secondary | ICD-10-CM | POA: Diagnosis not present

## 2022-05-13 DIAGNOSIS — E43 Unspecified severe protein-calorie malnutrition: Secondary | ICD-10-CM | POA: Diagnosis not present

## 2022-05-13 DIAGNOSIS — N179 Acute kidney failure, unspecified: Secondary | ICD-10-CM | POA: Diagnosis not present

## 2022-05-13 DIAGNOSIS — M5432 Sciatica, left side: Secondary | ICD-10-CM | POA: Diagnosis not present

## 2022-05-13 DIAGNOSIS — E538 Deficiency of other specified B group vitamins: Secondary | ICD-10-CM | POA: Diagnosis not present

## 2022-05-13 MED ORDER — EPOETIN ALFA-EPBX 10000 UNIT/ML IJ SOLN
INTRAMUSCULAR | Status: AC
  Filled 2022-05-13: qty 2

## 2022-05-13 MED ORDER — EPOETIN ALFA-EPBX 10000 UNIT/ML IJ SOLN
20000.0000 [IU] | INTRAMUSCULAR | Status: DC
  Administered 2022-05-13: 20000 [IU] via SUBCUTANEOUS

## 2022-05-18 DIAGNOSIS — K5900 Constipation, unspecified: Secondary | ICD-10-CM | POA: Diagnosis not present

## 2022-05-18 DIAGNOSIS — N179 Acute kidney failure, unspecified: Secondary | ICD-10-CM | POA: Diagnosis not present

## 2022-05-18 DIAGNOSIS — S3282XD Multiple fractures of pelvis without disruption of pelvic ring, subsequent encounter for fracture with routine healing: Secondary | ICD-10-CM | POA: Diagnosis not present

## 2022-05-18 DIAGNOSIS — G8929 Other chronic pain: Secondary | ICD-10-CM | POA: Diagnosis not present

## 2022-05-18 DIAGNOSIS — D631 Anemia in chronic kidney disease: Secondary | ICD-10-CM | POA: Diagnosis not present

## 2022-05-18 DIAGNOSIS — Z9181 History of falling: Secondary | ICD-10-CM | POA: Diagnosis not present

## 2022-05-18 DIAGNOSIS — M800AXD Age-related osteoporosis with current pathological fracture, other site, subsequent encounter for fracture with routine healing: Secondary | ICD-10-CM | POA: Diagnosis not present

## 2022-05-18 DIAGNOSIS — N1832 Chronic kidney disease, stage 3b: Secondary | ICD-10-CM | POA: Diagnosis not present

## 2022-05-18 DIAGNOSIS — J310 Chronic rhinitis: Secondary | ICD-10-CM | POA: Diagnosis not present

## 2022-05-18 DIAGNOSIS — K8044 Calculus of bile duct with chronic cholecystitis without obstruction: Secondary | ICD-10-CM | POA: Diagnosis not present

## 2022-05-18 DIAGNOSIS — D62 Acute posthemorrhagic anemia: Secondary | ICD-10-CM | POA: Diagnosis not present

## 2022-05-18 DIAGNOSIS — E559 Vitamin D deficiency, unspecified: Secondary | ICD-10-CM | POA: Diagnosis not present

## 2022-05-18 DIAGNOSIS — E43 Unspecified severe protein-calorie malnutrition: Secondary | ICD-10-CM | POA: Diagnosis not present

## 2022-05-18 DIAGNOSIS — E860 Dehydration: Secondary | ICD-10-CM | POA: Diagnosis not present

## 2022-05-18 DIAGNOSIS — J302 Other seasonal allergic rhinitis: Secondary | ICD-10-CM | POA: Diagnosis not present

## 2022-05-18 DIAGNOSIS — E538 Deficiency of other specified B group vitamins: Secondary | ICD-10-CM | POA: Diagnosis not present

## 2022-05-18 DIAGNOSIS — M5432 Sciatica, left side: Secondary | ICD-10-CM | POA: Diagnosis not present

## 2022-05-18 LAB — POCT HEMOGLOBIN-HEMACUE: Hemoglobin: 8.2 g/dL — ABNORMAL LOW (ref 12.0–15.0)

## 2022-05-20 DIAGNOSIS — N1832 Chronic kidney disease, stage 3b: Secondary | ICD-10-CM | POA: Diagnosis not present

## 2022-05-20 DIAGNOSIS — M800AXD Age-related osteoporosis with current pathological fracture, other site, subsequent encounter for fracture with routine healing: Secondary | ICD-10-CM | POA: Diagnosis not present

## 2022-05-20 DIAGNOSIS — S3282XD Multiple fractures of pelvis without disruption of pelvic ring, subsequent encounter for fracture with routine healing: Secondary | ICD-10-CM | POA: Diagnosis not present

## 2022-05-20 DIAGNOSIS — D62 Acute posthemorrhagic anemia: Secondary | ICD-10-CM | POA: Diagnosis not present

## 2022-05-20 DIAGNOSIS — K8044 Calculus of bile duct with chronic cholecystitis without obstruction: Secondary | ICD-10-CM | POA: Diagnosis not present

## 2022-05-20 DIAGNOSIS — N179 Acute kidney failure, unspecified: Secondary | ICD-10-CM | POA: Diagnosis not present

## 2022-05-20 DIAGNOSIS — M5432 Sciatica, left side: Secondary | ICD-10-CM | POA: Diagnosis not present

## 2022-05-20 DIAGNOSIS — J310 Chronic rhinitis: Secondary | ICD-10-CM | POA: Diagnosis not present

## 2022-05-20 DIAGNOSIS — G8929 Other chronic pain: Secondary | ICD-10-CM | POA: Diagnosis not present

## 2022-05-20 DIAGNOSIS — E43 Unspecified severe protein-calorie malnutrition: Secondary | ICD-10-CM | POA: Diagnosis not present

## 2022-05-20 DIAGNOSIS — E860 Dehydration: Secondary | ICD-10-CM | POA: Diagnosis not present

## 2022-05-20 DIAGNOSIS — E538 Deficiency of other specified B group vitamins: Secondary | ICD-10-CM | POA: Diagnosis not present

## 2022-05-20 DIAGNOSIS — D631 Anemia in chronic kidney disease: Secondary | ICD-10-CM | POA: Diagnosis not present

## 2022-05-20 DIAGNOSIS — E559 Vitamin D deficiency, unspecified: Secondary | ICD-10-CM | POA: Diagnosis not present

## 2022-05-20 DIAGNOSIS — Z9181 History of falling: Secondary | ICD-10-CM | POA: Diagnosis not present

## 2022-05-20 DIAGNOSIS — J302 Other seasonal allergic rhinitis: Secondary | ICD-10-CM | POA: Diagnosis not present

## 2022-05-20 DIAGNOSIS — K5900 Constipation, unspecified: Secondary | ICD-10-CM | POA: Diagnosis not present

## 2022-05-21 DIAGNOSIS — E860 Dehydration: Secondary | ICD-10-CM | POA: Diagnosis not present

## 2022-05-21 DIAGNOSIS — D631 Anemia in chronic kidney disease: Secondary | ICD-10-CM | POA: Diagnosis not present

## 2022-05-21 DIAGNOSIS — J302 Other seasonal allergic rhinitis: Secondary | ICD-10-CM | POA: Diagnosis not present

## 2022-05-21 DIAGNOSIS — G8929 Other chronic pain: Secondary | ICD-10-CM | POA: Diagnosis not present

## 2022-05-21 DIAGNOSIS — N1832 Chronic kidney disease, stage 3b: Secondary | ICD-10-CM | POA: Diagnosis not present

## 2022-05-21 DIAGNOSIS — E43 Unspecified severe protein-calorie malnutrition: Secondary | ICD-10-CM | POA: Diagnosis not present

## 2022-05-21 DIAGNOSIS — J310 Chronic rhinitis: Secondary | ICD-10-CM | POA: Diagnosis not present

## 2022-05-21 DIAGNOSIS — K5900 Constipation, unspecified: Secondary | ICD-10-CM | POA: Diagnosis not present

## 2022-05-21 DIAGNOSIS — M800AXD Age-related osteoporosis with current pathological fracture, other site, subsequent encounter for fracture with routine healing: Secondary | ICD-10-CM | POA: Diagnosis not present

## 2022-05-21 DIAGNOSIS — Z9181 History of falling: Secondary | ICD-10-CM | POA: Diagnosis not present

## 2022-05-21 DIAGNOSIS — D62 Acute posthemorrhagic anemia: Secondary | ICD-10-CM | POA: Diagnosis not present

## 2022-05-21 DIAGNOSIS — K8044 Calculus of bile duct with chronic cholecystitis without obstruction: Secondary | ICD-10-CM | POA: Diagnosis not present

## 2022-05-21 DIAGNOSIS — S3282XD Multiple fractures of pelvis without disruption of pelvic ring, subsequent encounter for fracture with routine healing: Secondary | ICD-10-CM | POA: Diagnosis not present

## 2022-05-21 DIAGNOSIS — E559 Vitamin D deficiency, unspecified: Secondary | ICD-10-CM | POA: Diagnosis not present

## 2022-05-21 DIAGNOSIS — E538 Deficiency of other specified B group vitamins: Secondary | ICD-10-CM | POA: Diagnosis not present

## 2022-05-21 DIAGNOSIS — N179 Acute kidney failure, unspecified: Secondary | ICD-10-CM | POA: Diagnosis not present

## 2022-05-21 DIAGNOSIS — M5432 Sciatica, left side: Secondary | ICD-10-CM | POA: Diagnosis not present

## 2022-05-25 DIAGNOSIS — E538 Deficiency of other specified B group vitamins: Secondary | ICD-10-CM | POA: Diagnosis not present

## 2022-05-25 DIAGNOSIS — D631 Anemia in chronic kidney disease: Secondary | ICD-10-CM | POA: Diagnosis not present

## 2022-05-25 DIAGNOSIS — S3282XD Multiple fractures of pelvis without disruption of pelvic ring, subsequent encounter for fracture with routine healing: Secondary | ICD-10-CM | POA: Diagnosis not present

## 2022-05-25 DIAGNOSIS — E43 Unspecified severe protein-calorie malnutrition: Secondary | ICD-10-CM | POA: Diagnosis not present

## 2022-05-25 DIAGNOSIS — G8929 Other chronic pain: Secondary | ICD-10-CM | POA: Diagnosis not present

## 2022-05-25 DIAGNOSIS — K8044 Calculus of bile duct with chronic cholecystitis without obstruction: Secondary | ICD-10-CM | POA: Diagnosis not present

## 2022-05-25 DIAGNOSIS — D62 Acute posthemorrhagic anemia: Secondary | ICD-10-CM | POA: Diagnosis not present

## 2022-05-25 DIAGNOSIS — E860 Dehydration: Secondary | ICD-10-CM | POA: Diagnosis not present

## 2022-05-25 DIAGNOSIS — N179 Acute kidney failure, unspecified: Secondary | ICD-10-CM | POA: Diagnosis not present

## 2022-05-25 DIAGNOSIS — K5900 Constipation, unspecified: Secondary | ICD-10-CM | POA: Diagnosis not present

## 2022-05-25 DIAGNOSIS — N1832 Chronic kidney disease, stage 3b: Secondary | ICD-10-CM | POA: Diagnosis not present

## 2022-05-25 DIAGNOSIS — M5432 Sciatica, left side: Secondary | ICD-10-CM | POA: Diagnosis not present

## 2022-05-25 DIAGNOSIS — J302 Other seasonal allergic rhinitis: Secondary | ICD-10-CM | POA: Diagnosis not present

## 2022-05-25 DIAGNOSIS — M800AXD Age-related osteoporosis with current pathological fracture, other site, subsequent encounter for fracture with routine healing: Secondary | ICD-10-CM | POA: Diagnosis not present

## 2022-05-25 DIAGNOSIS — Z9181 History of falling: Secondary | ICD-10-CM | POA: Diagnosis not present

## 2022-05-25 DIAGNOSIS — E559 Vitamin D deficiency, unspecified: Secondary | ICD-10-CM | POA: Diagnosis not present

## 2022-05-25 DIAGNOSIS — J310 Chronic rhinitis: Secondary | ICD-10-CM | POA: Diagnosis not present

## 2022-05-26 ENCOUNTER — Encounter (HOSPITAL_COMMUNITY): Payer: Self-pay

## 2022-05-27 ENCOUNTER — Encounter (HOSPITAL_COMMUNITY)
Admission: RE | Admit: 2022-05-27 | Discharge: 2022-05-27 | Disposition: A | Discharge status: Home / Self Care | Payer: Medicare Other | Source: Ambulatory Visit | Attending: Nephrology | Admitting: Nephrology

## 2022-05-27 VITALS — BP 114/56 | HR 82 | Temp 97.4°F | Resp 18

## 2022-05-27 DIAGNOSIS — N1832 Chronic kidney disease, stage 3b: Secondary | ICD-10-CM | POA: Diagnosis not present

## 2022-05-27 DIAGNOSIS — D631 Anemia in chronic kidney disease: Secondary | ICD-10-CM | POA: Insufficient documentation

## 2022-05-27 LAB — IRON AND TIBC
Iron: 178 ug/dL — ABNORMAL HIGH (ref 28–170)
Saturation Ratios: 84 % — ABNORMAL HIGH (ref 10.4–31.8)
TIBC: 211 ug/dL — ABNORMAL LOW (ref 250–450)
UIBC: 33 ug/dL

## 2022-05-27 LAB — FERRITIN: Ferritin: 1655 ng/mL — ABNORMAL HIGH (ref 11–307)

## 2022-05-27 LAB — POCT HEMOGLOBIN-HEMACUE: Hemoglobin: 7.9 g/dL — ABNORMAL LOW (ref 12.0–15.0)

## 2022-05-27 MED ORDER — EPOETIN ALFA-EPBX 10000 UNIT/ML IJ SOLN
20000.0000 [IU] | INTRAMUSCULAR | Status: DC
  Administered 2022-05-27: 20000 [IU] via SUBCUTANEOUS

## 2022-05-27 MED ORDER — EPOETIN ALFA-EPBX 10000 UNIT/ML IJ SOLN
INTRAMUSCULAR | Status: AC
  Filled 2022-05-27: qty 2

## 2022-05-27 NOTE — Progress Notes (Signed)
Notified Terry at [facility] of HGb 7.9, patient asymptomatic. Orders given to give med dose as ordered- no change.

## 2022-05-28 DIAGNOSIS — S3282XD Multiple fractures of pelvis without disruption of pelvic ring, subsequent encounter for fracture with routine healing: Secondary | ICD-10-CM | POA: Diagnosis not present

## 2022-05-28 DIAGNOSIS — J302 Other seasonal allergic rhinitis: Secondary | ICD-10-CM | POA: Diagnosis not present

## 2022-05-28 DIAGNOSIS — J310 Chronic rhinitis: Secondary | ICD-10-CM | POA: Diagnosis not present

## 2022-05-28 DIAGNOSIS — G8929 Other chronic pain: Secondary | ICD-10-CM | POA: Diagnosis not present

## 2022-05-28 DIAGNOSIS — N1832 Chronic kidney disease, stage 3b: Secondary | ICD-10-CM | POA: Diagnosis not present

## 2022-05-28 DIAGNOSIS — D62 Acute posthemorrhagic anemia: Secondary | ICD-10-CM | POA: Diagnosis not present

## 2022-05-28 DIAGNOSIS — M800AXD Age-related osteoporosis with current pathological fracture, other site, subsequent encounter for fracture with routine healing: Secondary | ICD-10-CM | POA: Diagnosis not present

## 2022-05-28 DIAGNOSIS — E559 Vitamin D deficiency, unspecified: Secondary | ICD-10-CM | POA: Diagnosis not present

## 2022-05-28 DIAGNOSIS — D631 Anemia in chronic kidney disease: Secondary | ICD-10-CM | POA: Diagnosis not present

## 2022-05-28 DIAGNOSIS — E43 Unspecified severe protein-calorie malnutrition: Secondary | ICD-10-CM | POA: Diagnosis not present

## 2022-05-28 DIAGNOSIS — M5432 Sciatica, left side: Secondary | ICD-10-CM | POA: Diagnosis not present

## 2022-05-28 DIAGNOSIS — K8044 Calculus of bile duct with chronic cholecystitis without obstruction: Secondary | ICD-10-CM | POA: Diagnosis not present

## 2022-05-28 DIAGNOSIS — Z9181 History of falling: Secondary | ICD-10-CM | POA: Diagnosis not present

## 2022-05-28 DIAGNOSIS — N179 Acute kidney failure, unspecified: Secondary | ICD-10-CM | POA: Diagnosis not present

## 2022-05-28 DIAGNOSIS — K5900 Constipation, unspecified: Secondary | ICD-10-CM | POA: Diagnosis not present

## 2022-05-28 DIAGNOSIS — E538 Deficiency of other specified B group vitamins: Secondary | ICD-10-CM | POA: Diagnosis not present

## 2022-05-28 DIAGNOSIS — E860 Dehydration: Secondary | ICD-10-CM | POA: Diagnosis not present

## 2022-05-31 DIAGNOSIS — N179 Acute kidney failure, unspecified: Secondary | ICD-10-CM | POA: Diagnosis not present

## 2022-05-31 DIAGNOSIS — N1832 Chronic kidney disease, stage 3b: Secondary | ICD-10-CM | POA: Diagnosis not present

## 2022-05-31 DIAGNOSIS — J302 Other seasonal allergic rhinitis: Secondary | ICD-10-CM | POA: Diagnosis not present

## 2022-05-31 DIAGNOSIS — Z9181 History of falling: Secondary | ICD-10-CM | POA: Diagnosis not present

## 2022-05-31 DIAGNOSIS — E860 Dehydration: Secondary | ICD-10-CM | POA: Diagnosis not present

## 2022-05-31 DIAGNOSIS — S3282XD Multiple fractures of pelvis without disruption of pelvic ring, subsequent encounter for fracture with routine healing: Secondary | ICD-10-CM | POA: Diagnosis not present

## 2022-05-31 DIAGNOSIS — K5900 Constipation, unspecified: Secondary | ICD-10-CM | POA: Diagnosis not present

## 2022-05-31 DIAGNOSIS — E559 Vitamin D deficiency, unspecified: Secondary | ICD-10-CM | POA: Diagnosis not present

## 2022-05-31 DIAGNOSIS — M5432 Sciatica, left side: Secondary | ICD-10-CM | POA: Diagnosis not present

## 2022-05-31 DIAGNOSIS — D631 Anemia in chronic kidney disease: Secondary | ICD-10-CM | POA: Diagnosis not present

## 2022-05-31 DIAGNOSIS — D62 Acute posthemorrhagic anemia: Secondary | ICD-10-CM | POA: Diagnosis not present

## 2022-05-31 DIAGNOSIS — M800AXD Age-related osteoporosis with current pathological fracture, other site, subsequent encounter for fracture with routine healing: Secondary | ICD-10-CM | POA: Diagnosis not present

## 2022-05-31 DIAGNOSIS — K8044 Calculus of bile duct with chronic cholecystitis without obstruction: Secondary | ICD-10-CM | POA: Diagnosis not present

## 2022-05-31 DIAGNOSIS — E43 Unspecified severe protein-calorie malnutrition: Secondary | ICD-10-CM | POA: Diagnosis not present

## 2022-05-31 DIAGNOSIS — J310 Chronic rhinitis: Secondary | ICD-10-CM | POA: Diagnosis not present

## 2022-05-31 DIAGNOSIS — G8929 Other chronic pain: Secondary | ICD-10-CM | POA: Diagnosis not present

## 2022-05-31 DIAGNOSIS — E538 Deficiency of other specified B group vitamins: Secondary | ICD-10-CM | POA: Diagnosis not present

## 2022-06-01 DIAGNOSIS — G8929 Other chronic pain: Secondary | ICD-10-CM | POA: Diagnosis not present

## 2022-06-01 DIAGNOSIS — Z9181 History of falling: Secondary | ICD-10-CM | POA: Diagnosis not present

## 2022-06-01 DIAGNOSIS — N1832 Chronic kidney disease, stage 3b: Secondary | ICD-10-CM | POA: Diagnosis not present

## 2022-06-01 DIAGNOSIS — M800AXD Age-related osteoporosis with current pathological fracture, other site, subsequent encounter for fracture with routine healing: Secondary | ICD-10-CM | POA: Diagnosis not present

## 2022-06-01 DIAGNOSIS — K5900 Constipation, unspecified: Secondary | ICD-10-CM | POA: Diagnosis not present

## 2022-06-01 DIAGNOSIS — M5432 Sciatica, left side: Secondary | ICD-10-CM | POA: Diagnosis not present

## 2022-06-01 DIAGNOSIS — N179 Acute kidney failure, unspecified: Secondary | ICD-10-CM | POA: Diagnosis not present

## 2022-06-01 DIAGNOSIS — E538 Deficiency of other specified B group vitamins: Secondary | ICD-10-CM | POA: Diagnosis not present

## 2022-06-01 DIAGNOSIS — D631 Anemia in chronic kidney disease: Secondary | ICD-10-CM | POA: Diagnosis not present

## 2022-06-01 DIAGNOSIS — S3282XD Multiple fractures of pelvis without disruption of pelvic ring, subsequent encounter for fracture with routine healing: Secondary | ICD-10-CM | POA: Diagnosis not present

## 2022-06-01 DIAGNOSIS — E860 Dehydration: Secondary | ICD-10-CM | POA: Diagnosis not present

## 2022-06-01 DIAGNOSIS — E559 Vitamin D deficiency, unspecified: Secondary | ICD-10-CM | POA: Diagnosis not present

## 2022-06-01 DIAGNOSIS — J310 Chronic rhinitis: Secondary | ICD-10-CM | POA: Diagnosis not present

## 2022-06-01 DIAGNOSIS — K8044 Calculus of bile duct with chronic cholecystitis without obstruction: Secondary | ICD-10-CM | POA: Diagnosis not present

## 2022-06-01 DIAGNOSIS — J302 Other seasonal allergic rhinitis: Secondary | ICD-10-CM | POA: Diagnosis not present

## 2022-06-01 DIAGNOSIS — E43 Unspecified severe protein-calorie malnutrition: Secondary | ICD-10-CM | POA: Diagnosis not present

## 2022-06-01 DIAGNOSIS — D62 Acute posthemorrhagic anemia: Secondary | ICD-10-CM | POA: Diagnosis not present

## 2022-06-10 ENCOUNTER — Encounter (HOSPITAL_COMMUNITY)
Admission: RE | Admit: 2022-06-10 | Discharge: 2022-06-10 | Disposition: A | Discharge status: Home / Self Care | Payer: Medicare Other | Source: Ambulatory Visit | Attending: Nephrology | Admitting: Nephrology

## 2022-06-10 VITALS — BP 133/58 | HR 72 | Temp 98.1°F | Resp 16

## 2022-06-10 DIAGNOSIS — N1832 Chronic kidney disease, stage 3b: Secondary | ICD-10-CM

## 2022-06-10 DIAGNOSIS — D631 Anemia in chronic kidney disease: Secondary | ICD-10-CM | POA: Diagnosis not present

## 2022-06-10 LAB — IRON AND TIBC
Iron: 190 ug/dL — ABNORMAL HIGH (ref 28–170)
Saturation Ratios: 90 % — ABNORMAL HIGH (ref 10.4–31.8)
TIBC: 211 ug/dL — ABNORMAL LOW (ref 250–450)
UIBC: 21 ug/dL

## 2022-06-10 LAB — FERRITIN: Ferritin: 2081 ng/mL — ABNORMAL HIGH (ref 11–307)

## 2022-06-10 LAB — PREPARE RBC (CROSSMATCH)

## 2022-06-10 MED ORDER — EPOETIN ALFA-EPBX 40000 UNIT/ML IJ SOLN
INTRAMUSCULAR | Status: AC
  Administered 2022-06-10: 25000 [IU] via SUBCUTANEOUS
  Filled 2022-06-10: qty 1

## 2022-06-10 MED ORDER — EPOETIN ALFA-EPBX 40000 UNIT/ML IJ SOLN: 25000.0000 [IU] | INTRAMUSCULAR | Status: DC

## 2022-06-10 MED ORDER — SODIUM CHLORIDE 0.9% IV SOLUTION: Freq: Once | INTRAVENOUS | Status: DC

## 2022-06-10 NOTE — Progress Notes (Signed)
Pt's hemoCue is 6.9.  Repeated  Result is 7.2.  Pt denies any signs of bleeding. Denies dark stools. States she feels the same.  Terry at [facility] notified. Awaiting further instructions

## 2022-06-10 NOTE — Progress Notes (Signed)
Pt will be getting 1 unit PRBs. Labs ordered and obtained. Family and pt kept informed

## 2022-06-11 LAB — BPAM RBC
Blood Product Expiration Date: 202307162359
ISSUE DATE / TIME: 202306221207
Unit Type and Rh: 6200

## 2022-06-11 LAB — TYPE AND SCREEN
ABO/RH(D): A POS
Antibody Screen: NEGATIVE
Unit division: 0

## 2022-06-11 LAB — POCT HEMOGLOBIN-HEMACUE: Hemoglobin: 7.2 g/dL — ABNORMAL LOW (ref 12.0–15.0)

## 2022-06-16 ENCOUNTER — Encounter (HOSPITAL_COMMUNITY): Payer: Self-pay

## 2022-06-19 ENCOUNTER — Encounter (HOSPITAL_COMMUNITY): Payer: Self-pay

## 2022-06-24 ENCOUNTER — Encounter (HOSPITAL_COMMUNITY)
Admission: RE | Admit: 2022-06-24 | Discharge: 2022-06-24 | Disposition: A | Discharge status: Home / Self Care | Payer: Medicare Other | Source: Ambulatory Visit | Attending: Nephrology | Admitting: Nephrology

## 2022-06-24 VITALS — BP 128/52 | HR 77

## 2022-06-24 DIAGNOSIS — N1832 Chronic kidney disease, stage 3b: Secondary | ICD-10-CM | POA: Diagnosis not present

## 2022-06-24 LAB — IRON AND TIBC
Iron: 171 ug/dL — ABNORMAL HIGH (ref 28–170)
Saturation Ratios: 97 % — ABNORMAL HIGH (ref 10.4–31.8)
TIBC: 176 ug/dL — ABNORMAL LOW (ref 250–450)
UIBC: 5 ug/dL

## 2022-06-24 LAB — FERRITIN: Ferritin: 1687 ng/mL — ABNORMAL HIGH (ref 11–307)

## 2022-06-24 LAB — POCT HEMOGLOBIN-HEMACUE: Hemoglobin: 8.6 g/dL — ABNORMAL LOW (ref 12.0–15.0)

## 2022-06-24 MED ORDER — EPOETIN ALFA-EPBX 40000 UNIT/ML IJ SOLN
INTRAMUSCULAR | Status: AC
  Filled 2022-06-24: qty 1

## 2022-06-24 MED ORDER — EPOETIN ALFA-EPBX 40000 UNIT/ML IJ SOLN
25000.0000 [IU] | INTRAMUSCULAR | Status: DC
  Administered 2022-06-24: 25000 [IU] via SUBCUTANEOUS

## 2022-06-26 ENCOUNTER — Other Ambulatory Visit: Payer: Self-pay

## 2022-06-26 ENCOUNTER — Ambulatory Visit (INDEPENDENT_AMBULATORY_CARE_PROVIDER_SITE_OTHER): Payer: Medicare Other

## 2022-06-26 ENCOUNTER — Ambulatory Visit
Admission: EM | Admit: 2022-06-26 | Discharge: 2022-06-26 | Disposition: A | Discharge status: Home / Self Care | Payer: Medicare Other | Attending: Emergency Medicine | Admitting: Emergency Medicine

## 2022-06-26 ENCOUNTER — Encounter: Payer: Self-pay | Admitting: Emergency Medicine

## 2022-06-26 DIAGNOSIS — M25521 Pain in right elbow: Secondary | ICD-10-CM

## 2022-06-26 DIAGNOSIS — M25421 Effusion, right elbow: Secondary | ICD-10-CM | POA: Diagnosis not present

## 2022-06-26 NOTE — ED Triage Notes (Signed)
Pt here with right elbow pain and swelling upon waking this am; denies known injury

## 2022-06-26 NOTE — ED Provider Notes (Addendum)
[facility]   [id number] 06/26/22 Arrival Time: 0910   Chief Complaint  Patient presents with   Joint Swelling    SUBJECTIVE: History from: patient and family.  Julia Kerr is a 88 y.o. female who presented to the urgent care for complaint of right elbow pain and swelling that started last night.  Denies any precipitating event, trauma or injury.  Has tried tylenol with mild relief  S symptoms are made worse with ROM.Marland Kitchen  Denies similar symptoms in the past.   Denies fever, chills, fatigue, confusion.  ROS: As per HPI.  All other pertinent ROS negative.     Past Medical History:  Diagnosis Date   Anemia    Past Surgical History:  Procedure Laterality Date   CATARACT EXTRACTION, BILATERAL     SHOULDER SURGERY     Allergies  Allergen Reactions   Food Other (See Comments)    Apples and strawberries cause burning of lips   No current facility-administered medications on file prior to encounter.   Current Outpatient Medications on File Prior to Encounter  Medication Sig Dispense Refill   calamine lotion Apply 1 application. topically as needed for itching. 177 mL 0   hydrOXYzine (ATARAX) 10 MG tablet Take 1 tablet (10 mg total) by mouth every 8 (eight) hours as needed for itching. 30 tablet 0   Polyethylene Glycol 400 (VISINE DRY EYE RELIEF OP) Place 1 drop into both eyes as needed (dry eyes).     triamcinolone (NASACORT) 55 MCG/ACT AERO nasal inhaler Nasal Allergy 55 mcg spray aerosol  PLACE 1 SPRAY INTO THE NOSE 2 (TWO) TIMES DAILY. 1 each 1   Social History   Socioeconomic History   Marital status: Widowed    Spouse name: Not on file   Number of children: Not on file   Years of education: Not on file   Highest education level: Not on file  Occupational History   Not on file  Tobacco Use   Smoking status: Never   Smokeless tobacco: Never  Vaping Use   Vaping Use: Never used  Substance and Sexual Activity   Alcohol use: Never   Drug use: Never   Sexual  activity: Not Currently  Other Topics Concern   Not on file  Social History Narrative   Not on file   Social Determinants of Health   Financial Resource Strain: Not on file  Food Insecurity: Not on file  Transportation Needs: Not on file  Physical Activity: Not on file  Stress: Not on file  Social Connections: Not on file  Intimate Partner Violence: Not on file   History reviewed. No pertinent family history.  OBJECTIVE:  Vitals:   06/26/22 0956  BP: 133/68  Pulse: 90  Resp: 18  Temp: 97.9 F (36.6 C)  TempSrc: Oral  SpO2: 98%     Physical Exam Vitals and nursing note reviewed.  Constitutional:      General: She is not in acute distress.    Appearance: Normal appearance. She is normal weight. She is not ill-appearing, toxic-appearing or diaphoretic.  HENT:     Head: Normocephalic.  Cardiovascular:     Rate and Rhythm: Normal rate and regular rhythm.     Pulses: Normal pulses.     Heart sounds: Normal heart sounds. No murmur heard.    No friction rub. No gallop.  Pulmonary:     Effort: Pulmonary effort is normal. No respiratory distress.     Breath sounds: Normal breath sounds. No stridor. No  wheezing, rhonchi or rales.  Chest:     Chest wall: No tenderness.  Musculoskeletal:     Right elbow: Swelling present. Tenderness present.     Comments: The right elbow is with obvious deformity when compared to the left elbow.  There is swelling, tenderness present.  There is no ecchymosis, open wound or lesion present.  Limited range of motion due to pain.  Neurovascular status intact.  Neurological:     Mental Status: She is alert and oriented to person, place, and time.     LABS:  No results found for this or any previous visit (from the past 24 hour(s)).   ASSESSMENT & PLAN:  RADIOLOGY  DG Elbow Complete Right  Result Date: 06/26/2022 CLINICAL DATA:  Right elbow pain. EXAM: RIGHT ELBOW - COMPLETE 3+ VIEW COMPARISON:  None Available. FINDINGS: No definite  fracture is seen, although the lateral view is suboptimal in positioning, likely due to patient's discomfort. There is a large joint effusion. No significant soft tissue swelling.  Vascular calcifications noted. IMPRESSION: Large right elbow joint effusion, without definite fracture seen. This may be due to an occult fracture or synovitis/septic joint. Recommend follow-up with CT or MRI of the elbow and possible joint aspiration. Electronically Signed   By: Fidela Salisbury M.D.   On: 06/26/2022 11:16     Right elbow X-ray is positive for elbow effusion.  I have reviewed the x-ray myself and the radiologist interpretation.  I am in agreement with the radiologist interpretation.   1. Right elbow pain   2. Effusion of right elbow     No orders of the defined types were placed in this encounter.  Discharge instructions  Follow RICE instructions as attached Continue to take Tylenol 650 mg 3 times daily for pain Follow-up with PCP Follow-up with orthopedic for further evaluation Return or go to ED if you develop any new or worsening of your symptoms  Reviewed expectations re: course of current medical issues. Questions answered. Outlined signs and symptoms indicating need for more acute intervention. Patient verbalized understanding. After Visit Summary given.           Emerson Monte, FNP 06/26/22 1129    Emerson Monte, FNP 06/26/22 1130

## 2022-06-26 NOTE — Discharge Instructions (Addendum)
Follow RICE instructions as attached Continue to take Tylenol 650 mg 3 times daily for pain Follow-up with PCP Follow-up with orthopedic for further evaluation Return or go to ED if you develop any new or worsening of your symptoms

## 2022-06-29 ENCOUNTER — Other Ambulatory Visit: Payer: Self-pay | Admitting: Hematology and Oncology

## 2022-06-29 DIAGNOSIS — N1832 Chronic kidney disease, stage 3b: Secondary | ICD-10-CM

## 2022-06-30 ENCOUNTER — Other Ambulatory Visit: Payer: Self-pay

## 2022-06-30 ENCOUNTER — Inpatient Hospital Stay: Payer: Medicare Other

## 2022-06-30 ENCOUNTER — Inpatient Hospital Stay: Payer: Medicare Other | Attending: Hematology and Oncology | Admitting: Hematology and Oncology

## 2022-06-30 VITALS — BP 131/51 | HR 76 | Temp 98.6°F | Resp 15 | Wt 83.9 lb

## 2022-06-30 DIAGNOSIS — N189 Chronic kidney disease, unspecified: Secondary | ICD-10-CM | POA: Insufficient documentation

## 2022-06-30 DIAGNOSIS — N1832 Chronic kidney disease, stage 3b: Secondary | ICD-10-CM | POA: Diagnosis not present

## 2022-06-30 DIAGNOSIS — E860 Dehydration: Secondary | ICD-10-CM

## 2022-06-30 DIAGNOSIS — Z79899 Other long term (current) drug therapy: Secondary | ICD-10-CM | POA: Insufficient documentation

## 2022-06-30 DIAGNOSIS — M25521 Pain in right elbow: Secondary | ICD-10-CM | POA: Diagnosis not present

## 2022-06-30 DIAGNOSIS — D631 Anemia in chronic kidney disease: Secondary | ICD-10-CM | POA: Insufficient documentation

## 2022-06-30 DIAGNOSIS — D638 Anemia in other chronic diseases classified elsewhere: Secondary | ICD-10-CM | POA: Diagnosis not present

## 2022-06-30 DIAGNOSIS — D72829 Elevated white blood cell count, unspecified: Secondary | ICD-10-CM | POA: Insufficient documentation

## 2022-06-30 DIAGNOSIS — R590 Localized enlarged lymph nodes: Secondary | ICD-10-CM | POA: Insufficient documentation

## 2022-06-30 DIAGNOSIS — D649 Anemia, unspecified: Secondary | ICD-10-CM | POA: Insufficient documentation

## 2022-06-30 LAB — CMP (CANCER CENTER ONLY)
ALT: 8 U/L (ref 0–44)
AST: 18 U/L (ref 15–41)
Albumin: 3.9 g/dL (ref 3.5–5.0)
Alkaline Phosphatase: 111 U/L (ref 38–126)
Anion gap: 6 (ref 5–15)
BUN: 39 mg/dL — ABNORMAL HIGH (ref 8–23)
CO2: 22 mmol/L (ref 22–32)
Calcium: 9 mg/dL (ref 8.9–10.3)
Chloride: 111 mmol/L (ref 98–111)
Creatinine: 1.73 mg/dL — ABNORMAL HIGH (ref 0.44–1.00)
GFR, Estimated: 27 mL/min — ABNORMAL LOW (ref >60)
Glucose, Bld: 81 mg/dL (ref 70–99)
Potassium: 4.6 mmol/L (ref 3.5–5.1)
Sodium: 139 mmol/L (ref 135–145)
Total Bilirubin: 0.7 mg/dL (ref 0.3–1.2)
Total Protein: 6.7 g/dL (ref 6.5–8.1)

## 2022-06-30 LAB — CBC WITH DIFFERENTIAL (CANCER CENTER ONLY)
Abs Immature Granulocytes: 0.98 10*3/uL — ABNORMAL HIGH (ref 0.00–0.07)
Basophils Absolute: 0 10*3/uL (ref 0.0–0.1)
Basophils Relative: 0 %
Eosinophils Absolute: 0 10*3/uL (ref 0.0–0.5)
Eosinophils Relative: 0 %
HCT: 25.9 % — ABNORMAL LOW (ref 36.0–46.0)
Hemoglobin: 8.1 g/dL — ABNORMAL LOW (ref 12.0–15.0)
Immature Granulocytes: 7 %
Lymphocytes Relative: 13 %
Lymphs Abs: 1.9 10*3/uL (ref 0.7–4.0)
MCH: 29.1 pg (ref 26.0–34.0)
MCHC: 31.3 g/dL (ref 30.0–36.0)
MCV: 93.2 fL (ref 80.0–100.0)
Monocytes Absolute: 3.2 10*3/uL — ABNORMAL HIGH (ref 0.1–1.0)
Monocytes Relative: 21 %
Neutro Abs: 8.9 10*3/uL — ABNORMAL HIGH (ref 1.7–7.7)
Neutrophils Relative %: 59 %
Platelet Count: 199 10*3/uL (ref 150–400)
RBC: 2.78 MIL/uL — ABNORMAL LOW (ref 3.87–5.11)
RDW: 17.2 % — ABNORMAL HIGH (ref 11.5–15.5)
Smear Review: NORMAL
WBC Count: 14.9 10*3/uL — ABNORMAL HIGH (ref 4.0–10.5)
nRBC: 0.6 % — ABNORMAL HIGH (ref 0.0–0.2)

## 2022-06-30 LAB — SAMPLE TO BLOOD BANK

## 2022-07-06 ENCOUNTER — Encounter (HOSPITAL_COMMUNITY): Payer: Self-pay

## 2022-07-06 NOTE — Progress Notes (Signed)
[place] [phone]   [phone]  PROGRESS NOTE  Patient Care Team: [name], Hershal Coria as PCP - General  Hematological/Oncological History # Normocytic Anemia 08/04/2018: WBC 4.2, Hgb 9.4, MCV 89, Plt 264 05/05/2021: WBC 12.2, Hgb 8.9, MCV 94.9, Plt 229 09/23/2021: WBC 7.2, Hgb 6.6, MCV 101.9, Plt 173. Ferritin 1059, TIBC 245, Iron sat 27%. Retc 4.3% 09/24/2021: WBC 10.1, Hgb 8.9, MCV 92, Plt 186 09/30/2021: establish care with Dr. Lorenso Courier   Interval History:  Julia Kerr 88 y.o. female with medical history significant for normocytic anemia and axillary lymphadenopathy who presents for a follow up visit. The patient's last visit was on 03/24/2022. In the interim since the last visit she has had no major changes in her health and reportedly has continued to receive EPO shots with her nephrologist.  On exam today Julia Kerr is accompanied by her son who is her legal guardian.  He does most of the talking for her.  He notes she is having swelling of her right elbow and has a scheduled visit with her primary doctor to evaluate this today.  She notes that she is not having any lymphadenopathy under her arm any longer.  She reports that she is avoiding ibuprofen but taking Tylenol for her elbow pain.  She is not having any issues with bleeding, bruising, or dark stools.  She reports that she has continued to receive Retacrit with Dr. Carolin Sicks, her nephrologist.  She notes she is not having any issues with lightheadedness or dizziness.  She is having a lot of itching throughout her body without a clear etiology.  She denies any issues with shortness of breath.  A full 10 point ROS is listed below.  MEDICAL HISTORY:  Past Medical History:  Diagnosis Date   Anemia     SURGICAL HISTORY: Past Surgical History:  Procedure Laterality Date   CATARACT EXTRACTION, BILATERAL     SHOULDER SURGERY      SOCIAL HISTORY: Social History   Socioeconomic History    Marital status: Widowed    Spouse name: Not on file   Number of children: Not on file   Years of education: Not on file   Highest education level: Not on file  Occupational History   Not on file  Tobacco Use   Smoking status: Never   Smokeless tobacco: Never  Vaping Use   Vaping Use: Never used  Substance and Sexual Activity   Alcohol use: Never   Drug use: Never   Sexual activity: Not Currently  Other Topics Concern   Not on file  Social History Narrative   Not on file   Social Determinants of Health   Financial Resource Strain: Not on file  Food Insecurity: Not on file  Transportation Needs: Not on file  Physical Activity: Not on file  Stress: Not on file  Social Connections: Not on file  Intimate Partner Violence: Not on file    FAMILY HISTORY: No family history on file.  ALLERGIES:  is allergic to food.  MEDICATIONS:  Current Outpatient Medications  Medication Sig Dispense Refill   calamine lotion Apply 1 application. topically as needed for itching. 177 mL 0   hydrOXYzine (ATARAX) 10 MG tablet Take 1 tablet (10 mg total) by mouth every 8 (eight) hours as needed for itching. 30 tablet 0   Polyethylene Glycol 400 (VISINE DRY EYE RELIEF OP) Place 1 drop into both eyes as needed (dry eyes).     triamcinolone (NASACORT) 55 MCG/ACT AERO  nasal inhaler Nasal Allergy 55 mcg spray aerosol  PLACE 1 SPRAY INTO THE NOSE 2 (TWO) TIMES DAILY. 1 each 1   No current facility-administered medications for this visit.    REVIEW OF SYSTEMS:   Constitutional: ( - ) fevers, ( - )  chills , ( - ) night sweats Eyes: ( - ) blurriness of vision, ( - ) double vision, ( - ) watery eyes Ears, nose, mouth, throat, and face: ( - ) mucositis, ( - ) sore throat Respiratory: ( - ) cough, ( - ) dyspnea, ( - ) wheezes Cardiovascular: ( - ) palpitation, ( - ) chest discomfort, ( - ) lower extremity swelling Gastrointestinal:  ( - ) nausea, ( - ) heartburn, ( - ) change in bowel habits Skin: (  - ) abnormal skin rashes Lymphatics: ( - ) new lymphadenopathy, ( - ) easy bruising Neurological: ( - ) numbness, ( - ) tingling, ( - ) new weaknesses Behavioral/Psych: ( - ) mood change, ( - ) new changes  All other systems were reviewed with the patient and are negative.  PHYSICAL EXAMINATION:  Vitals:   06/30/22 0950  BP: (!) 131/51  Pulse: 76  Resp: 15  Temp: 98.6 F (37 C)  SpO2: 100%   Filed Weights   06/30/22 0950  Weight: 83 lb 14.4 oz (38.1 kg)    GENERAL: Well-appearing elderly Hispanic female, alert, no distress and comfortable SKIN: skin color, texture, turgor are normal, no rashes or significant lesions EYES: conjunctiva are pink and non-injected, sclera clear LYMPH:  no palpable lymphadenopathy in the cervical or inguinal LUNGS: clear to auscultation and percussion with normal breathing effort HEART: regular rate & rhythm and no murmurs and no lower extremity edema Musculoskeletal: no cyanosis of digits and no clubbing  PSYCH: alert & oriented x 3, fluent speech NEURO: no focal motor/sensory deficits  LABORATORY DATA:  I have reviewed the data as listed    Latest Ref Rng & Units 06/30/2022    8:46 AM 06/24/2022    9:27 AM 06/10/2022   10:28 AM  CBC  WBC 4.0 - 10.5 K/uL 14.9     Hemoglobin 12.0 - 15.0 g/dL 8.1  8.6  7.2   Hematocrit 36.0 - 46.0 % 25.9     Platelets 150 - 400 K/uL 199          Latest Ref Rng & Units 06/30/2022    8:46 AM 03/18/2022    6:15 PM 03/10/2022    9:30 AM  CMP  Glucose 70 - 99 mg/dL 81  133  111   BUN 8 - 23 mg/dL 39  46  44   Creatinine 0.44 - 1.00 mg/dL 1.73  1.71  1.70   Sodium 135 - 145 mmol/L 139  139  140   Potassium 3.5 - 5.1 mmol/L 4.6  4.3  4.6   Chloride 98 - 111 mmol/L 111  108  106   CO2 22 - 32 mmol/L _0 Calcium 8.9 - 10.3 mg/dL 9.0  8.3  8.7   Total Protein 6.5 - 8.1 g/dL 6.7  6.9  6.3   Total Bilirubin 0.3 - 1.2 mg/dL 0.7  1.1  0.9   Alkaline Phos 38 - 126 U/L 111  108  152   AST 15 - 41 U/L 18  31   32   ALT 0 - 44 U/L _1 Lab Results  Component Value  Date   MPROTEIN Not Observed 09/30/2021    RADIOGRAPHIC STUDIES: DG Elbow Complete Right  Result Date: 06/26/2022 CLINICAL DATA:  Right elbow pain. EXAM: RIGHT ELBOW - COMPLETE 3+ VIEW COMPARISON:  None Available. FINDINGS: No definite fracture is seen, although the lateral view is suboptimal in positioning, likely due to patient's discomfort. There is a large joint effusion. No significant soft tissue swelling.  Vascular calcifications noted. IMPRESSION: Large right elbow joint effusion, without definite fracture seen. This may be due to an occult fracture or synovitis/septic joint. Recommend follow-up with CT or MRI of the elbow and possible joint aspiration. Electronically Signed   By: Fidela Salisbury M.D.   On: 06/26/2022 11:16    [place] 88 y.o. female with medical history significant for normocytic anemia and axillary lymphadenopathy who presents for a follow up visit.  #Anemia 2/2 to CKD -- We will discuss with nephrologist what dose of EPO she is receiving at this time --Labs today show white blood cell count 14.9, hemoglobin 8.1, MCV 93.2, and platelets 199 --Iron labs appear consistent with anemia of chronic disease with markedly high ferritin and low total iron-binding opacity --We will need to consider bone marrow biopsy if hemoglobin levels or not improving with high levels of EPO --RTC in 3 months time.   #Leukocytosis -- White blood cell count today 79.1, neutrophilic predominance --Likely secondary to inflammation of the elbow --Patient is to have this evaluated by her physician later today.  # Bilateral Axillary Lymphadenopathy-resolved -- Continue to monitor   No orders of the defined types were placed in this encounter.   All questions were answered. The patient knows to call the clinic with any problems, questions or concerns.  A total of more than 30 minutes were spent  on this encounter with face-to-face time and non-face-to-face time, including preparing to see the patient, ordering tests and/or medications, counseling the patient and coordination of care as outlined above.   Ledell Peoples, MD Department of Hematology/Oncology [place] at [facility] Phone: [phone] Pager: [phone] Email: [email or web address]  07/06/2022 1:38 PM

## 2022-07-08 ENCOUNTER — Encounter (HOSPITAL_COMMUNITY)
Admission: RE | Admit: 2022-07-08 | Discharge: 2022-07-08 | Disposition: A | Discharge status: Home / Self Care | Payer: Medicare Other | Source: Ambulatory Visit | Attending: Nephrology | Admitting: Nephrology

## 2022-07-08 VITALS — BP 110/56 | HR 78 | Temp 97.1°F | Resp 16

## 2022-07-08 DIAGNOSIS — N1832 Chronic kidney disease, stage 3b: Secondary | ICD-10-CM | POA: Diagnosis not present

## 2022-07-08 LAB — POCT HEMOGLOBIN-HEMACUE: Hemoglobin: 8.7 g/dL — ABNORMAL LOW (ref 12.0–15.0)

## 2022-07-08 MED ORDER — EPOETIN ALFA-EPBX 40000 UNIT/ML IJ SOLN: 25000.0000 [IU] | INTRAMUSCULAR | Status: DC

## 2022-07-08 MED ORDER — EPOETIN ALFA 40000 UNIT/ML IJ SOLN
INTRAMUSCULAR | Status: AC
  Filled 2022-07-08: qty 1

## 2022-07-08 MED ORDER — EPOETIN ALFA 40000 UNIT/ML IJ SOLN
25000.0000 [IU] | Freq: Once | INTRAMUSCULAR | Status: AC
  Administered 2022-07-08: 25000 [IU] via SUBCUTANEOUS

## 2022-07-12 DIAGNOSIS — L299 Pruritus, unspecified: Secondary | ICD-10-CM | POA: Diagnosis not present

## 2022-07-12 DIAGNOSIS — D649 Anemia, unspecified: Secondary | ICD-10-CM | POA: Diagnosis not present

## 2022-07-12 DIAGNOSIS — N183 Chronic kidney disease, stage 3 unspecified: Secondary | ICD-10-CM | POA: Diagnosis not present

## 2022-07-13 DIAGNOSIS — N183 Chronic kidney disease, stage 3 unspecified: Secondary | ICD-10-CM | POA: Diagnosis not present

## 2022-07-13 DIAGNOSIS — D649 Anemia, unspecified: Secondary | ICD-10-CM | POA: Diagnosis not present

## 2022-07-13 DIAGNOSIS — L299 Pruritus, unspecified: Secondary | ICD-10-CM | POA: Diagnosis not present

## 2022-07-13 DIAGNOSIS — K8020 Calculus of gallbladder without cholecystitis without obstruction: Secondary | ICD-10-CM | POA: Diagnosis not present

## 2022-07-19 DIAGNOSIS — N179 Acute kidney failure, unspecified: Secondary | ICD-10-CM | POA: Diagnosis not present

## 2022-07-19 DIAGNOSIS — Z20822 Contact with and (suspected) exposure to covid-19: Secondary | ICD-10-CM | POA: Diagnosis not present

## 2022-07-19 DIAGNOSIS — N184 Chronic kidney disease, stage 4 (severe): Secondary | ICD-10-CM | POA: Diagnosis not present

## 2022-07-19 DIAGNOSIS — R300 Dysuria: Secondary | ICD-10-CM | POA: Diagnosis not present

## 2022-07-19 DIAGNOSIS — N3000 Acute cystitis without hematuria: Secondary | ICD-10-CM | POA: Diagnosis not present

## 2022-07-19 DIAGNOSIS — R809 Proteinuria, unspecified: Secondary | ICD-10-CM | POA: Diagnosis not present

## 2022-07-19 DIAGNOSIS — D631 Anemia in chronic kidney disease: Secondary | ICD-10-CM | POA: Diagnosis not present

## 2022-07-22 ENCOUNTER — Encounter (HOSPITAL_COMMUNITY): Payer: Medicaid Other

## 2022-08-03 ENCOUNTER — Encounter (HOSPITAL_COMMUNITY): Payer: Self-pay

## 2022-08-03 ENCOUNTER — Other Ambulatory Visit: Payer: Self-pay

## 2022-08-03 ENCOUNTER — Observation Stay: Payer: Medicare (Managed Care)

## 2022-08-03 ENCOUNTER — Observation Stay
Admission: EM | Admit: 2022-08-03 | Discharge: 2022-08-05 | Disposition: A | Discharge status: Home / Self Care | Payer: Medicare (Managed Care) | Attending: Internal Medicine | Admitting: Internal Medicine

## 2022-08-03 ENCOUNTER — Encounter: Payer: Self-pay | Admitting: Emergency Medicine

## 2022-08-03 DIAGNOSIS — D631 Anemia in chronic kidney disease: Secondary | ICD-10-CM | POA: Diagnosis not present

## 2022-08-03 DIAGNOSIS — R636 Underweight: Secondary | ICD-10-CM | POA: Insufficient documentation

## 2022-08-03 DIAGNOSIS — E43 Unspecified severe protein-calorie malnutrition: Secondary | ICD-10-CM | POA: Insufficient documentation

## 2022-08-03 DIAGNOSIS — N179 Acute kidney failure, unspecified: Secondary | ICD-10-CM | POA: Insufficient documentation

## 2022-08-03 DIAGNOSIS — M6281 Muscle weakness (generalized): Secondary | ICD-10-CM | POA: Insufficient documentation

## 2022-08-03 DIAGNOSIS — D649 Anemia, unspecified: Principal | ICD-10-CM | POA: Insufficient documentation

## 2022-08-03 DIAGNOSIS — Z23 Encounter for immunization: Secondary | ICD-10-CM | POA: Insufficient documentation

## 2022-08-03 DIAGNOSIS — N184 Chronic kidney disease, stage 4 (severe): Secondary | ICD-10-CM | POA: Diagnosis not present

## 2022-08-03 DIAGNOSIS — Z79899 Other long term (current) drug therapy: Secondary | ICD-10-CM | POA: Diagnosis not present

## 2022-08-03 DIAGNOSIS — R197 Diarrhea, unspecified: Secondary | ICD-10-CM | POA: Diagnosis not present

## 2022-08-03 DIAGNOSIS — R531 Weakness: Secondary | ICD-10-CM | POA: Insufficient documentation

## 2022-08-03 DIAGNOSIS — N1832 Chronic kidney disease, stage 3b: Secondary | ICD-10-CM | POA: Diagnosis present

## 2022-08-03 LAB — COMPREHENSIVE METABOLIC PANEL
ALT: 27 U/L (ref 0–44)
AST: 37 U/L (ref 15–41)
Albumin: 3.6 g/dL (ref 3.5–5.0)
Alkaline Phosphatase: 101 U/L (ref 38–126)
Anion gap: 6 (ref 5–15)
BUN: 63 mg/dL — ABNORMAL HIGH (ref 8–23)
CO2: 21 mmol/L — ABNORMAL LOW (ref 22–32)
Calcium: 8.2 mg/dL — ABNORMAL LOW (ref 8.9–10.3)
Chloride: 113 mmol/L — ABNORMAL HIGH (ref 98–111)
Creatinine, Ser: 2.22 mg/dL — ABNORMAL HIGH (ref 0.44–1.00)
GFR, Estimated: 20 mL/min — ABNORMAL LOW (ref >60)
Glucose, Bld: 125 mg/dL — ABNORMAL HIGH (ref 70–99)
Potassium: 4.4 mmol/L (ref 3.5–5.1)
Sodium: 140 mmol/L (ref 135–145)
Total Bilirubin: 1 mg/dL (ref 0.3–1.2)
Total Protein: 6.6 g/dL (ref 6.5–8.1)

## 2022-08-03 LAB — CBC WITH DIFFERENTIAL/PLATELET
Abs Immature Granulocytes: 0.72 10*3/uL — ABNORMAL HIGH (ref 0.00–0.07)
Basophils Absolute: 0 10*3/uL (ref 0.0–0.1)
Basophils Relative: 0 %
Eosinophils Absolute: 0 10*3/uL (ref 0.0–0.5)
Eosinophils Relative: 0 %
HCT: 20.1 % — ABNORMAL LOW (ref 36.0–46.0)
Hemoglobin: 5.7 g/dL — ABNORMAL LOW (ref 12.0–15.0)
Immature Granulocytes: 5 %
Lymphocytes Relative: 12 %
Lymphs Abs: 1.7 10*3/uL (ref 0.7–4.0)
MCH: 27.5 pg (ref 26.0–34.0)
MCHC: 28.4 g/dL — ABNORMAL LOW (ref 30.0–36.0)
MCV: 97.1 fL (ref 80.0–100.0)
Monocytes Absolute: 3.2 10*3/uL — ABNORMAL HIGH (ref 0.1–1.0)
Monocytes Relative: 23 %
Neutro Abs: 8.2 10*3/uL — ABNORMAL HIGH (ref 1.7–7.7)
Neutrophils Relative %: 60 %
Platelets: 214 10*3/uL (ref 150–400)
RBC: 2.07 MIL/uL — ABNORMAL LOW (ref 3.87–5.11)
RDW: 19 % — ABNORMAL HIGH (ref 11.5–15.5)
WBC: 13.8 10*3/uL — ABNORMAL HIGH (ref 4.0–10.5)
nRBC: 0.1 % (ref 0.0–0.2)

## 2022-08-03 LAB — PREPARE RBC (CROSSMATCH)

## 2022-08-03 MED ORDER — PNEUMOCOCCAL 20-VAL CONJ VACC 0.5 ML IM SUSY
0.5000 mL | PREFILLED_SYRINGE | INTRAMUSCULAR | Status: AC
  Administered 2022-08-05: 0.5 mL via INTRAMUSCULAR
  Filled 2022-08-03 (×4): qty 0.5

## 2022-08-03 MED ORDER — ACETAMINOPHEN 325 MG PO TABS: 650.0000 mg | ORAL_TABLET | Freq: Four times a day (QID) | ORAL | Status: DC | PRN

## 2022-08-03 MED ORDER — ONDANSETRON HCL 4 MG/2ML IJ SOLN
4.0000 mg | Freq: Four times a day (QID) | INTRAMUSCULAR | Status: DC | PRN
Start: 2022-08-03 — End: 2022-08-05

## 2022-08-03 MED ORDER — HYDROXYZINE HCL 10 MG PO TABS: 10.0000 mg | ORAL_TABLET | Freq: Three times a day (TID) | ORAL | Status: DC | PRN

## 2022-08-03 MED ORDER — ONDANSETRON HCL 4 MG PO TABS: 4.0000 mg | ORAL_TABLET | Freq: Four times a day (QID) | ORAL | Status: DC | PRN

## 2022-08-03 MED ORDER — SENNOSIDES-DOCUSATE SODIUM 8.6-50 MG PO TABS: 1.0000 | ORAL_TABLET | Freq: Every evening | ORAL | Status: DC | PRN

## 2022-08-03 MED ORDER — SODIUM CHLORIDE 0.9 % IV SOLN
10.0000 mL/h | Freq: Once | INTRAVENOUS | Status: AC
  Administered 2022-08-03: 10 mL/h via INTRAVENOUS

## 2022-08-03 MED ORDER — ACETAMINOPHEN 650 MG RE SUPP
650.0000 mg | Freq: Four times a day (QID) | RECTAL | Status: DC | PRN
Start: 2022-08-03 — End: 2022-08-05

## 2022-08-03 NOTE — Assessment & Plan Note (Signed)
-   Patient gets Retacrit (epoetin) every 2 weeks - Patient missed the last dose, July 22, 2022 as she was in [place] - Patient's baseline hemoglobin is 7.9-8.7 - Recheck hemoglobin at 22:30 on day of admission - CBC in the a.m.

## 2022-08-03 NOTE — Assessment & Plan Note (Signed)
-   Gradually worsening - Baseline serum creatinine is 1.5-1.97, GFR 29-31 - BMP in the a.m.

## 2022-08-03 NOTE — ED Provider Triage Note (Signed)
  Emergency Medicine Provider Triage Evaluation Note  [place] , a 88 y.o.female,  was evaluated in triage.  Pt complains of anemia and dizziness.  Patient's family member states that she was evaluated on Friday and had her blood drawn, which showed a hemoglobin of 7.1.  Patient states that she felt dizzy this morning.  No other symptoms at this time.  Review of Systems  Positive: Dizziness Negative: Denies fever, chest pain, vomiting  Physical Exam   Vitals:   08/03/22 1515  BP: (!) 126/49  Pulse: 91  Resp: 18  Temp: 97.9 F (36.6 C)  SpO2: 100%   Gen:   Awake, no distress   Resp:  Normal effort  MSK:   Moves extremities without difficulty  Other:    Medical Decision Making  Given the patient's initial medical screening exam, the following diagnostic evaluation has been ordered. The patient will be placed in the appropriate treatment space, once one is available, to complete the evaluation and treatment. I have discussed the plan of care with the patient and I have advised the patient that an ED physician or mid-level practitioner will reevaluate their condition after the test results have been received, as the results may give them additional insight into the type of treatment they may need.    Diagnostics: Labs  Treatments: none immediately   Teodoro Spray, [place] 08/03/22 1607

## 2022-08-03 NOTE — H&P (Signed)
History and Physical   Jeanann Balinski ZOX:[id number] DOB: 17-Aug-1923 DOA: 08/03/2022  PCP: [name], PA-C  Patient coming from: Home  I have personally briefly reviewed patient's old medical records in [place].  Chief Concern: Dizziness, weakness  HPI: [name] is a 88 year old female with history of anemia of chronic kidney disease, CKD 3B, cataract surgery, Spanish-speaking only, who presents emergency department for chief concerns of weakness and dizziness.  Initial vitals in the emergency department showed temperature 97.9, respiration rate of 18, heart rate of 91, blood pressure 126/49, SPO2 100% on room air.  Serum sodium is 140, potassium 4.4, chloride of 113, bicarb 21, BUN of 63, serum creatinine of 2.22, GFR of 20, nonfasting blood glucose 125, WBC 13.8, platelets of 214.  Hemoglobin is 5.7.  EDP performed rectal exam and guaiac stool was negative.  ED treatment: 2 units of PRBC ordered  At bedside she was able to tell me her full name, and states her age is 88 years old and then corrected herself and said she is 88.  She was not able to tell me the current calendar year.  She knows she is in the hospital.  She reports she is not hurting anywhere at this time.  She reports she does not know why she is in the hospital.  Per son over the phone, she missed one dose of iron infusion, August 3rd.  Son also endorses that patient has had diarrhea for one month.  Social history: She lives at home with her son. Son denies tobacco, etoh, recreational drug use.   ROS: Unable to complete as patient appears to have memory difficulty  ED Course: Discussed with emergency medicine provider, patient requiring hospitalization for chief concerns of acute anemia.  Assessment/Plan  Principal Problem:   Symptomatic anemia Active Problems:   Stage 3b chronic kidney disease (HCC)   Anemia of chronic kidney failure, stage 4 (severe) (HCC)   Diarrhea   AKI (acute kidney  injury) ([place])   Assessment and Plan:  * Symptomatic anemia - Presumed secondary to CKD 3B - 2 units of PRBC ordered  AKI (acute kidney injury) ([place]) - Patient's baseline renal function is 1.51-1.73, GFR of 27-31 over the last 5 months - Repeat BMP in the a.m., if renal function does not improve I would recommend a.m. team to consult nephrology for further evaluation  Diarrhea - C. difficile and GI panel ordered  Anemia of chronic kidney failure, stage 4 (severe) ([place]) - Patient gets Retacrit (epoetin) every 2 weeks - Patient missed the last dose, July 22, 2022 as she was in [place] - Patient's baseline hemoglobin is 7.9-8.7 - Recheck hemoglobin at 22:30 on day of admission - CBC in the a.m.  Stage 3b chronic kidney disease ([place]) - Gradually worsening - Baseline serum creatinine is 1.5-1.97, GFR 29-31 - BMP in the a.m.  Chart reviewed.   DVT prophylaxis: None due to acute anemia Code Status: full code Diet: Heart healthy Family Communication: Updated son, Jiles Garter over the phone. He states you can call anytime for questions and updates Disposition Plan: Pending clinical course Consults called: None at this time Admission status: Telemetry medical, observation  Past Medical History:  Diagnosis Date   Anemia    Past Surgical History:  Procedure Laterality Date   CATARACT EXTRACTION, BILATERAL     SHOULDER SURGERY     Social History:  reports that she has never smoked. She has never used smokeless tobacco. She reports that she  does not drink alcohol and does not use drugs.  Allergies  Allergen Reactions   Food Other (See Comments)    Apples and strawberries cause burning of lips   No family history on file. Family history: Family history reviewed and not pertinent.  Prior to Admission medications   Medication Sig Start Date End Date Taking? Authorizing Provider  calamine lotion Apply 1 application. topically as needed for itching. 04/12/22   [name],  PA-C  hydrOXYzine (ATARAX) 10 MG tablet Take 1 tablet (10 mg total) by mouth every 8 (eight) hours as needed for itching. 04/12/22   [name], PA-C  Polyethylene Glycol 400 (VISINE DRY EYE RELIEF OP) Place 1 drop into both eyes as needed (dry eyes).    [provider]  triamcinolone (NASACORT) 55 MCG/ACT AERO nasal inhaler Nasal Allergy 55 mcg spray aerosol  PLACE 1 SPRAY INTO THE NOSE 2 (TWO) TIMES DAILY. 04/12/22   [name], PA-C   Physical Exam: Vitals:   08/03/22 1830 08/03/22 1855 08/03/22 1858 08/03/22 1911  BP: (!) 114/50 (!) 115/51 (!) 115/51 (!) 112/44  Pulse:  84 83 83  Resp:  '16 16 20  '$ Temp:  98.2 F (36.8 C) 98.2 F (36.8 C) 98 F (36.7 C)  TempSrc:  Oral Oral Oral  SpO2:  100% 100% 100%  Weight:      Height:       Constitutional: appears age appropriate, frail, NAD, calm, comfortable Eyes: PERRL, lids and conjunctivae normal ENMT: Mucous membranes are moist. Posterior pharynx clear of any exudate or lesions. Age-appropriate dentition. Hearing appropriate Neck: normal, supple, no masses, no thyromegaly Respiratory: clear to auscultation bilaterally, no wheezing, no crackles. Normal respiratory effort. No accessory muscle use.  Cardiovascular: Regular rate and rhythm, no murmurs / rubs / gallops. No extremity edema. 2+ pedal pulses. No carotid bruits.  Abdomen: no tenderness, no masses palpated, no hepatosplenomegaly. Bowel sounds positive.  Musculoskeletal: no clubbing / cyanosis. No joint deformity upper and lower extremities. Good ROM, no contractures, no atrophy. Normal muscle tone.  Skin: no rashes, lesions, ulcers. No induration Neurologic: Sensation intact. Strength 5/5 in all 4.  Psychiatric: Normal judgment and insight. Alert and oriented x 3. Normal mood.   EKG: independently reviewed, showing   Chest x-ray on Admission: I personally reviewed and I agree with radiologist reading as below.  DG Chest Port 1 View  Result Date:  08/03/2022 CLINICAL DATA:  Dyspnea on exertion. EXAM: PORTABLE CHEST 1 VIEW COMPARISON:  May 05, 2021 FINDINGS: The heart size and mediastinal contours are within normal limits. There is mild calcification of the aortic arch and tortuosity of the descending thoracic aorta. Diffuse, chronic appearing increased interstitial lung markings are seen. There is no evidence of acute infiltrate, pleural effusion or pneumothorax. A radiopaque fusion plate and screws are seen within the proximal right humerus. Degenerative changes seen throughout the thoracic spine. IMPRESSION: Chronic appearing increased interstitial lung markings without evidence of acute or active cardiopulmonary disease. Electronically Signed   By: Virgina Norfolk M.D.   On: 08/03/2022 19:36    Labs on Admission: I have personally reviewed following labs CBC: Recent Labs  Lab 08/03/22 1519  WBC 13.8*  NEUTROABS 8.2*  HGB 5.7*  HCT 20.1*  MCV 97.1  PLT 144   Basic Metabolic Panel: Recent Labs  Lab 08/03/22 1519  NA 140  K 4.4  CL 113*  CO2 21*  GLUCOSE 125*  BUN 63*  CREATININE 2.22*  CALCIUM 8.2*   GFR: Estimated  Creatinine Clearance: 8.5 mL/min (A) (by C-G formula based on SCr of 2.22 mg/dL (H)).  Liver Function Tests: Recent Labs  Lab 08/03/22 1519  AST 37  ALT 27  ALKPHOS 101  BILITOT 1.0  PROT 6.6  ALBUMIN 3.6   Urine analysis:    Component Value Date/Time   COLORURINE STRAW (A) 03/18/2022 1815   APPEARANCEUR CLEAR (A) 03/18/2022 1815   LABSPEC 1.008 03/18/2022 1815   PHURINE 6.0 03/18/2022 1815   GLUCOSEU NEGATIVE 03/18/2022 1815   HGBUR SMALL (A) 03/18/2022 1815   BILIRUBINUR NEGATIVE 03/18/2022 1815   [place] NEGATIVE 03/18/2022 1815   PROTEINUR 30 (A) 03/18/2022 1815   NITRITE NEGATIVE 03/18/2022 1815   LEUKOCYTESUR TRACE (A) 03/18/2022 1815   Dr. Tobie Poet [facility]  If 7PM-7AM, please contact overnight-coverage provider If 7AM-7PM, please contact day coverage  provider [email or web address]  08/03/2022, 9:50 PM

## 2022-08-03 NOTE — ED Triage Notes (Signed)
Patient to ED for low hemoglobin. Patient had blood work drawn on Friday and hemoglobin came back at 7.1. Patient c/o dizziness and not as ambulatory as normal per family member.

## 2022-08-03 NOTE — Assessment & Plan Note (Addendum)
No evidence of blood loss.  Felt to be secondary to her chronic kidney disease and she had missed an infusion session.  Received 2 units packed red blood cells and within 10 hours, hemoglobin up to 9.0 from 5.7.  Repeat hemoglobin 12 hours later higher at 9.6.

## 2022-08-03 NOTE — Assessment & Plan Note (Signed)
.    Secondary to hypotension from anemia.  Renal function continues to improve.- Patient's baseline renal function is 1.51-1.73, GFR of 27-31 over the last 5 months.  Creatinine today at 1.95 with GFR of 23.

## 2022-08-03 NOTE — Hospital Course (Addendum)
[name] is a 88 year old female with history of anemia of chronic kidney disease, CKD 3B, cataract surgery, Spanish-speaking only, who presents emergency department for chief concerns of weakness and dizziness.  Initial vitals in the emergency department showed temperature 97.9, respiration rate of 18, heart rate of 91, blood pressure 126/49, SPO2 100% on room air.  Serum sodium is 140, potassium 4.4, chloride of 113, bicarb 21, BUN of 63, serum creatinine of 2.22, GFR of 20, nonfasting blood glucose 125, WBC 13.8, platelets of 214.  Hemoglobin is 5.7.  EDP performed rectal exam and guaiac stool was negative.  ED treatment: 2 units of PRBC ordered

## 2022-08-03 NOTE — ED Provider Notes (Signed)
[facility] Provider Note    Event Date/Time   First MD Initiated Contact with Patient 08/03/22 1757     (approximate)   History   Chief Complaint Low Hemoglobin   HPI  Julia Kerr is a 88 y.o. female with past medical history of CKD and anemia who presents to the ED complaining of low hemoglobin.  History is limited as patient is Spanish-speaking only and son, who is her legal guardian, provides the majority of the history.  He states that the patient has been increasingly weak and dizzy over the past 4 to 5 days.  He states that her weakness has gotten to the point that she is no longer able to walk.  They have been following with her nephrologist for her anemia, thought to be related to her chronic kidney disease.  They were told earlier today that labs drawn on Friday had a hemoglobin of 7.1 and that she needed to come to the ED for further evaluation.  Patient denies any chest pain or shortness of breath.  They have not noticed any blood in her stool or dark tarry stool.  She does not take any blood thinners.     Physical Exam   Triage Vital Signs: ED Triage Vitals  Enc Vitals Group     BP 08/03/22 1515 (!) 126/49     Pulse Rate 08/03/22 1515 91     Resp 08/03/22 1515 18     Temp 08/03/22 1515 97.9 F (36.6 C)     Temp Source 08/03/22 1515 Oral     SpO2 08/03/22 1515 100 %     Weight 08/03/22 1516 80 lb (36.3 kg)     Height 08/03/22 1516 '4\' 8"'$  (1.422 m)     Head Circumference --      Peak Flow --      Pain Score 08/03/22 1515 4     Pain Loc --      Pain Edu? --      Excl. in [place]? --     Most recent vital signs: Vitals:   08/03/22 1800 08/03/22 1830  BP: (!) 132/50 (!) 114/50  Pulse: 91   Resp:    Temp:    SpO2: 100%     Constitutional: Awake and alert, thin and cachectic. Eyes: Conjunctivae are normal. Head: Atraumatic. Nose: No congestion/rhinnorhea. Mouth/Throat: Mucous membranes are moist.  Cardiovascular: Normal rate, regular  rhythm. Grossly normal heart sounds.  2+ radial pulses bilaterally. Respiratory: Normal respiratory effort.  No retractions. Lungs CTAB. Gastrointestinal: Soft and nontender. No distention.  Rectal exam with light brown stool that is guaiac negative. Musculoskeletal: No lower extremity tenderness nor edema.  Neurologic:  Normal speech and language. No gross focal neurologic deficits are appreciated.    ED Results / Procedures / Treatments   Labs (all labs ordered are listed, but only abnormal results are displayed) Labs Reviewed  CBC WITH DIFFERENTIAL/PLATELET - Abnormal; Notable for the following components:      Result Value   WBC 13.8 (*)    RBC 2.07 (*)    Hemoglobin 5.7 (*)    HCT 20.1 (*)    MCHC 28.4 (*)    RDW 19.0 (*)    Neutro Abs 8.2 (*)    Monocytes Absolute 3.2 (*)    Abs Immature Granulocytes 0.72 (*)    All other components within normal limits  COMPREHENSIVE METABOLIC PANEL - Abnormal; Notable for the following components:   Chloride 113 (*)  CO2 21 (*)    Glucose, Bld 125 (*)    BUN 63 (*)    Creatinine, Ser 2.22 (*)    Calcium 8.2 (*)    GFR, Estimated 20 (*)    All other components within normal limits  PATHOLOGIST SMEAR REVIEW  TYPE AND SCREEN  PREPARE RBC (CROSSMATCH)    PROCEDURES:  Critical Care performed: Yes, see critical care procedure note(s)  .Critical Care  Performed by: Blake Divine, MD Authorized by: Blake Divine, MD   Critical care provider statement:    Critical care time (minutes):  30   Critical care time was exclusive of:  Separately billable procedures and treating other patients and teaching time   Critical care was necessary to treat or prevent imminent or life-threatening deterioration of the following conditions: Anemia requiring transfusion.   Critical care was time spent personally by me on the following activities:  Development of treatment plan with patient or surrogate, discussions with consultants, evaluation of  patient's response to treatment, examination of patient, ordering and review of laboratory studies, ordering and review of radiographic studies, ordering and performing treatments and interventions, pulse oximetry, re-evaluation of patient's condition and review of old charts   I assumed direction of critical care for this patient from another provider in my specialty: no     Care discussed with: admitting provider      MEDICATIONS ORDERED IN ED: Medications  0.9 %  sodium chloride infusion (has no administration in time range)     IMPRESSION / MDM / [place] / ED COURSE  I reviewed the triage vital signs and the nursing notes.                              88 y.o. female with past medical history of CKD and anemia who presents to the ED with increased generalized weakness and difficulty walking over the past few days, noted to have worsening anemia on outpatient labs.  Patient's presentation is most consistent with acute presentation with potential threat to life or bodily function.  Differential diagnosis includes, but is not limited to, GI bleed, malignancy, hemolytic anemia, anemia of chronic disease, anemia secondary to chronic kidney disease, AKI, electrolyte abnormality.  Patient chronically ill-appearing but in no acute distress, vital signs are unremarkable and her abdominal exam is benign.  Rectal exam with light brown stool that is guaiac negative, no evidence of acute bleeding at this time.  Labs do show significant anemia at 5.7, leukocytosis noted but this appears to be chronic, platelets unremarkable.  She does have worsening renal function but no significant associated electrolyte abnormality noted.  We will transfuse 2 units PRBCs and case discussed with hospitalist for admission.      FINAL CLINICAL IMPRESSION(S) / ED DIAGNOSES   Final diagnoses:  Symptomatic anemia  Generalized weakness     Rx / DC Orders   ED Discharge Orders     None         Note:  This document was prepared using Dragon voice recognition software and may include unintentional dictation errors.   Blake Divine, MD 08/03/22 [phone]

## 2022-08-03 NOTE — Assessment & Plan Note (Signed)
-   C. difficile and GI panel ordered

## 2022-08-03 NOTE — Progress Notes (Signed)
Lab at bedside for H&H. Patient with 2nd unit PRBC currently infusing. Will retime H&H for one hour post transfusion.

## 2022-08-03 NOTE — ED Notes (Signed)
Report messaged to [employer]

## 2022-08-03 NOTE — ED Notes (Signed)
1st unit of PRBC's started  family with pt.  Pt alert.

## 2022-08-03 NOTE — Progress Notes (Signed)
Pt admitted to room 122, brought up to room by ED RN. ED RN states that "patient moved arm during transport and caused a skin tear to left forearm." Small skin tear noted to LFA under tape where IV was inserted.

## 2022-08-03 NOTE — ED Notes (Signed)
Pt resting quietly.  Prbc's infusing.  Siderails up x 2.

## 2022-08-03 NOTE — Progress Notes (Signed)
This RN at bedside attempting to place cardiac monitor on patient per MD order. Interpreter [id number] utilized for patient interaction. Explained to patient via interpreter purpose of cardiac monitoring. Pt refuses cardiac monitoring and states "I do not want that on me, I think that it will hurt me." Explained to patient via interpreter that the cardiac monitoring is non-invasive and how monitoring leads are applied to chest. Pt continues to refuse cardiac monitoring and states "Wait until my son comes tomorrow and he can check it out first." Cardiac monitoring not applied per patient's request. Hospitalist made aware. Pt denies any needs or concerns at this time. CBWR.

## 2022-08-04 ENCOUNTER — Observation Stay: Payer: Medicare (Managed Care)

## 2022-08-04 DIAGNOSIS — R531 Weakness: Secondary | ICD-10-CM | POA: Diagnosis not present

## 2022-08-04 DIAGNOSIS — R636 Underweight: Secondary | ICD-10-CM | POA: Diagnosis not present

## 2022-08-04 DIAGNOSIS — N179 Acute kidney failure, unspecified: Secondary | ICD-10-CM

## 2022-08-04 DIAGNOSIS — E43 Unspecified severe protein-calorie malnutrition: Secondary | ICD-10-CM | POA: Diagnosis present

## 2022-08-04 DIAGNOSIS — D649 Anemia, unspecified: Secondary | ICD-10-CM | POA: Diagnosis not present

## 2022-08-04 LAB — BASIC METABOLIC PANEL
Anion gap: 4 — ABNORMAL LOW (ref 5–15)
Anion gap: 7 (ref 5–15)
BUN: 60 mg/dL — ABNORMAL HIGH (ref 8–23)
BUN: 59 mg/dL — ABNORMAL HIGH (ref 8–23)
CO2: 22 mmol/L (ref 22–32)
CO2: 21 mmol/L — ABNORMAL LOW (ref 22–32)
Calcium: 8.1 mg/dL — ABNORMAL LOW (ref 8.9–10.3)
Calcium: 8.3 mg/dL — ABNORMAL LOW (ref 8.9–10.3)
Chloride: 115 mmol/L — ABNORMAL HIGH (ref 98–111)
Chloride: 112 mmol/L — ABNORMAL HIGH (ref 98–111)
Creatinine, Ser: 2.02 mg/dL — ABNORMAL HIGH (ref 0.44–1.00)
Creatinine, Ser: 1.95 mg/dL — ABNORMAL HIGH (ref 0.44–1.00)
GFR, Estimated: 22 mL/min — ABNORMAL LOW (ref >60)
GFR, Estimated: 23 mL/min — ABNORMAL LOW (ref >60)
Glucose, Bld: 85 mg/dL (ref 70–99)
Glucose, Bld: 133 mg/dL — ABNORMAL HIGH (ref 70–99)
Potassium: 4.4 mmol/L (ref 3.5–5.1)
Potassium: 4.7 mmol/L (ref 3.5–5.1)
Sodium: 141 mmol/L (ref 135–145)
Sodium: 140 mmol/L (ref 135–145)

## 2022-08-04 LAB — CBC
HCT: 28.5 % — ABNORMAL LOW (ref 36.0–46.0)
HCT: 30.5 % — ABNORMAL LOW (ref 36.0–46.0)
Hemoglobin: 9 g/dL — ABNORMAL LOW (ref 12.0–15.0)
Hemoglobin: 9.6 g/dL — ABNORMAL LOW (ref 12.0–15.0)
MCH: 27.5 pg (ref 26.0–34.0)
MCH: 27.5 pg (ref 26.0–34.0)
MCHC: 31.6 g/dL (ref 30.0–36.0)
MCHC: 31.5 g/dL (ref 30.0–36.0)
MCV: 87.2 fL (ref 80.0–100.0)
MCV: 87.4 fL (ref 80.0–100.0)
Platelets: 201 10*3/uL (ref 150–400)
Platelets: 204 10*3/uL (ref 150–400)
RBC: 3.27 MIL/uL — ABNORMAL LOW (ref 3.87–5.11)
RBC: 3.49 MIL/uL — ABNORMAL LOW (ref 3.87–5.11)
RDW: 18.6 % — ABNORMAL HIGH (ref 11.5–15.5)
RDW: 20 % — ABNORMAL HIGH (ref 11.5–15.5)
WBC: 12.8 10*3/uL — ABNORMAL HIGH (ref 4.0–10.5)
WBC: 10.9 10*3/uL — ABNORMAL HIGH (ref 4.0–10.5)
nRBC: 0.3 % — ABNORMAL HIGH (ref 0.0–0.2)
nRBC: 0.4 % — ABNORMAL HIGH (ref 0.0–0.2)

## 2022-08-04 LAB — TYPE AND SCREEN
ABO/RH(D): A POS
Antibody Screen: NEGATIVE
Unit division: 0
Unit division: 0

## 2022-08-04 LAB — BPAM RBC
Blood Product Expiration Date: 202308192359
Blood Product Expiration Date: 202309062359
ISSUE DATE / TIME: 202308151849
ISSUE DATE / TIME: 202308152158
ISSUING PHYSICIAN: NEGATIVE
ISSUING PHYSICIAN: NEGATIVE
Unit Type and Rh: 600
Unit Type and Rh: 6200

## 2022-08-04 LAB — PATHOLOGIST SMEAR REVIEW

## 2022-08-04 MED ORDER — ENSURE ENLIVE PO LIQD: 237.0000 mL | Freq: Two times a day (BID) | ORAL | Status: DC

## 2022-08-04 MED ORDER — ADULT MULTIVITAMIN W/MINERALS CH
1.0000 | ORAL_TABLET | Freq: Every day | ORAL | Status: DC
  Administered 2022-08-04 – 2022-08-05 (×2): 1 via ORAL
  Filled 2022-08-04 (×2): qty 1

## 2022-08-04 NOTE — Progress Notes (Signed)
[facility] Progress Note  Patient: Julia Kerr    UKG:[id number]  DOA: 08/03/2022    Date of Service: the patient was seen and examined on 08/04/2022  Brief hospital course: 88 year old Spanish-speaking female with history of stage IIIb chronic kidney disease and secondary pneumonia who presented to the emergency room on the afternoon of 8/15 with complaints of weakness and dizziness, and according to patient's son has had diarrhea for the past month.  Hemoglobin found to be 5.7 (baseline around 8) and acute kidney injury with a creatinine of 2.2 (baseline around 1.7).  Transfused 2 units packed red blood cells.  Following transfusion, hemoglobin at 9.0 by 8/16.  Assessment and Plan: Assessment and Plan: * Symptomatic anemia in the setting of in the from stage IIIb/IV chronic kidney disease No evidence of blood loss.  Felt to be secondary to her chronic kidney disease and she had missed an infusion session.  Received 2 units packed red blood cells and within 10 hours, hemoglobin up to 9.0 from 5.7.  Repeat hemoglobin 12 hours later higher at 9.6.    AKI (acute kidney injury) ([place]) in the setting of stage IIIb/IV chronic kidney disease .  Secondary to hypotension from anemia.  Renal function continues to improve.- Patient's baseline renal function is 1.51-1.73, GFR of 27-31 over the last 5 months.  Creatinine today at 1.95 with GFR of 23.  Diarrhea - C. difficile and GI panel both negative.  Diarrhea has greatly improved during hospitalization.  Underweight Meets criteria with BMI greater than BMI of 17.9.  Nutrition consulted.  Anemia of chronic kidney failure, stage 4 (severe) (HCC) - Patient gets Retacrit (epoetin) every 2 weeks - Patient missed the last dose, July 22, 2022 as she was in [place] - Patient's baseline hemoglobin is 7.9-8.7 - Recheck hemoglobin at 22:30 on day of admission - CBC in the a.m.  Stage 3b chronic kidney disease ([place]) - Gradually worsening - Baseline  serum creatinine is 1.5-1.97, GFR 29-31 - BMP in the a.m.       Body mass index is 17.94 kg/m.        Consultants: None  Procedures: Status posttransfusion 2 unit packed red blood cells  Antimicrobials: None  Code Status: Full code   Subjective: Patient with no complaints, feeling better through interpreter  Objective: Vital signs were reviewed and unremarkable. Vitals:   08/04/22 0440 08/04/22 0913  BP: 134/64 137/68  Pulse: 67   Resp: 18   Temp: 97.7 F (36.5 C) 97.6 F (36.4 C)  SpO2: 100% 100%    Intake/Output Summary (Last 24 hours) at 08/04/2022 1444 Last data filed at 08/04/2022 0308 Gross per 24 hour  Intake 732.83 ml  Output --  Net 732.83 ml   Filed Weights   08/03/22 1516  Weight: 36.3 kg   Body mass index is 17.94 kg/m.  Exam:  General: Alert and oriented x2, no acute distress HEENT: Normocephalic, atraumatic, mucous membranes slightly dry Cardiovascular: The rate and rhythm, S1-S2 Respiratory: Clear to auscultation bilaterally Abdomen:, Nontender, nondistended, positive bowel sounds Musculoskeletal: No clubbing or cyanosis or edema Skin: No skin breaks, tears or lesions Psychiatry: Appropriate, no evidence of psychoses Neurology: No focal deficits  Data Reviewed: Creatinine of 1.95, improved from 2.02  Disposition:  Status is: Observation     Anticipated discharge date: 8/17  Remaining issues to be resolved so that patient can be discharged:  -Evaluation by physical therapy -Evaluation by nutrition -Further improvement in renal function   Family Communication: Updated  patient's son by phone DVT Prophylaxis: Place TED hose Start: 08/03/22 1900    Author: Annita Brod ,MD 08/04/2022 2:44 PM  To reach On-call, see care teams to locate the attending and reach out via [email or web address]. Between 7PM-7AM, please contact night-coverage If you still have difficulty reaching the attending provider, please page the [facility]  (Director on Call) for [facility] on amion for assistance.

## 2022-08-04 NOTE — Progress Notes (Addendum)
[facility] called this CSW for updates, CSW informed them patient has just moved from ED no new updates from MD yet on medical status. They request :   Please call Julia Kerr social worker with Julia Kerr for discharge updates [phone] Fax updates including dc summary and any discharge orders to [phone]  They confirm patient's guardian is son Julia Kerr   Patient was living in home with son prior to admission, gong to adult day services twice a week. [facility] was working on setting up home services in the interim however she is new with [facility] as of August 1st, they have yet to do initial visit to set patient up with doctor and her team.   Julia Kerr, [place]

## 2022-08-04 NOTE — Assessment & Plan Note (Signed)
Meets criteria with BMI greater than BMI of 17.9.  Nutrition consulted.

## 2022-08-04 NOTE — Assessment & Plan Note (Signed)
Nutrition Status: Nutrition Problem: Severe Malnutrition Etiology: chronic illness (CKD) Signs/Symptoms: moderate fat depletion, severe fat depletion, moderate muscle depletion, severe muscle depletion Interventions: Ensure Enlive (each supplement provides 350kcal and 20 grams of protein), MVI, Liberalize Diet

## 2022-08-04 NOTE — Progress Notes (Signed)
Initial Nutrition Assessment  DOCUMENTATION CODES:   Underweight, Severe malnutrition in context of chronic illness  INTERVENTION:   -Ensure Enlive po BID, each supplement provides 350 kcal and 20 grams of protein -MVI with minerals daily -Liberalize diet to regular for widest variety of meal selections  NUTRITION DIAGNOSIS:   Severe Malnutrition related to chronic illness (CKD) as evidenced by moderate fat depletion, severe fat depletion, moderate muscle depletion, severe muscle depletion.  GOAL:   Patient will meet greater than or equal to 90% of their needs  MONITOR:   PO intake, Supplement acceptance  REASON FOR ASSESSMENT:   Consult Assessment of nutrition requirement/status  ASSESSMENT:   Pt with history of stage IIIb chronic kidney disease and secondary pneumonia who presented with complaints of weakness and dizziness.  Pt admitted with symptomatic anemia secondary to CKD and AKI.   Pt sleeping soundly at time of visit. No family at bedside. She did not arouse to voice.   Observed lunch and breakfast trays. Pt consumed 100% of lunch and 50% of breakfast.   Reviewed wt hx; pt has experienced a 4% wt loss over the past 4 months, which is not significant for time frame.   Pt with increased nutritional needs and would benefit from addition of oral nutrition supplements.   Medications reviewed.   Labs reviewed.   NUTRITION - FOCUSED PHYSICAL EXAM:  Flowsheet Row Most Recent Value  Orbital Region Severe depletion  Upper Arm Region Severe depletion  Thoracic and Lumbar Region Severe depletion  Buccal Region Severe depletion  Temple Region Severe depletion  Clavicle Bone Region Severe depletion  Clavicle and Acromion Bone Region Severe depletion  Scapular Bone Region Severe depletion  Dorsal Hand Moderate depletion  Patellar Region Moderate depletion  Anterior Thigh Region Moderate depletion  Posterior Calf Region Moderate depletion  Edema (RD Assessment)  Mild  Hair Reviewed  Eyes Reviewed  Mouth Reviewed  Skin Reviewed  Nails Reviewed       Diet Order:   Diet Order             Diet Heart Room service appropriate? Yes; Fluid consistency: Thin  Diet effective now                   EDUCATION NEEDS:   No education needs have been identified at this time  Skin:  Skin Assessment: Skin Integrity Issues: Skin Integrity Issues:: Other (Comment) Other: skin tear to lt lower arm  Last BM:  08/01/22  Height:   Ht Readings from Last 1 Encounters:  08/03/22 '4\' 8"'$  (1.422 m)    Weight:   Wt Readings from Last 1 Encounters:  08/03/22 36.3 kg    Ideal Body Weight:  42.4 kg  BMI:  Body mass index is 17.94 kg/m.  Estimated Nutritional Needs:   Kcal:  1450-1650  Protein:  70-85 grams  Fluid:  > 1.4 L    Loistine Chance, RD, LDN, [place] Registered Dietitian II Certified Diabetes Care and Education Specialist Please refer to [facility] for RD and/or RD on-call/weekend/after hours pager

## 2022-08-05 ENCOUNTER — Encounter (HOSPITAL_COMMUNITY): Payer: Medicaid Other

## 2022-08-05 DIAGNOSIS — E43 Unspecified severe protein-calorie malnutrition: Secondary | ICD-10-CM

## 2022-08-05 DIAGNOSIS — N179 Acute kidney failure, unspecified: Secondary | ICD-10-CM | POA: Diagnosis not present

## 2022-08-05 DIAGNOSIS — D649 Anemia, unspecified: Secondary | ICD-10-CM | POA: Diagnosis not present

## 2022-08-05 DIAGNOSIS — R636 Underweight: Secondary | ICD-10-CM | POA: Diagnosis not present

## 2022-08-05 LAB — CBC
HCT: 30.5 % — ABNORMAL LOW (ref 36.0–46.0)
Hemoglobin: 9.6 g/dL — ABNORMAL LOW (ref 12.0–15.0)
MCH: 27.7 pg (ref 26.0–34.0)
MCHC: 31.5 g/dL (ref 30.0–36.0)
MCV: 87.9 fL (ref 80.0–100.0)
Platelets: 211 10*3/uL (ref 150–400)
RBC: 3.47 MIL/uL — ABNORMAL LOW (ref 3.87–5.11)
RDW: 19.4 % — ABNORMAL HIGH (ref 11.5–15.5)
WBC: 12.4 10*3/uL — ABNORMAL HIGH (ref 4.0–10.5)
nRBC: 0.2 % (ref 0.0–0.2)

## 2022-08-05 LAB — BASIC METABOLIC PANEL
Anion gap: 7 (ref 5–15)
BUN: 53 mg/dL — ABNORMAL HIGH (ref 8–23)
CO2: 20 mmol/L — ABNORMAL LOW (ref 22–32)
Calcium: 8.3 mg/dL — ABNORMAL LOW (ref 8.9–10.3)
Chloride: 112 mmol/L — ABNORMAL HIGH (ref 98–111)
Creatinine, Ser: 1.89 mg/dL — ABNORMAL HIGH (ref 0.44–1.00)
GFR, Estimated: 24 mL/min — ABNORMAL LOW (ref >60)
Glucose, Bld: 84 mg/dL (ref 70–99)
Potassium: 4.1 mmol/L (ref 3.5–5.1)
Sodium: 139 mmol/L (ref 135–145)

## 2022-08-05 MED ORDER — BOOST / RESOURCE BREEZE PO LIQD CUSTOM: 1.0000 | Freq: Three times a day (TID) | ORAL | Status: DC

## 2022-08-05 MED ORDER — ENSURE ENLIVE PO LIQD: 237.0000 mL | Freq: Two times a day (BID) | ORAL | 12 refills | Status: DC

## 2022-08-05 MED ORDER — ADULT MULTIVITAMIN W/MINERALS CH: 1.0000 | ORAL_TABLET | Freq: Every day | ORAL | 1 refills | Status: DC

## 2022-08-05 NOTE — Progress Notes (Signed)
Hemoglobin is stable this morning. Patient rested without any complications this shift.

## 2022-08-05 NOTE — Progress Notes (Signed)
Received MD order to discharge patient to home.  Using a [employer] Spanish interpretor, I reviewed discharge instructions, prescriptions, homes meds, follow up appointments and wound care with both patient and her son Jiles Garter and both verbalized verbalized understanding.

## 2022-08-05 NOTE — Progress Notes (Signed)
Brief Nutrition Note  Received call from RN. Per pt son is concerned about pt receiving Ensure secondary to pt's lactose intolerance. RD will changes supplement to [facility].   Noted pt for discharge today.   Loistine Chance, RD, LDN, [place] Registered Dietitian II Certified Diabetes Care and Education Specialist Please refer to [facility] for RD and/or RD on-call/weekend/after hours pager

## 2022-08-05 NOTE — TOC Transition Note (Signed)
Transition of Care [facility]) - CM/SW Discharge Note   Patient Details  Name: Julia Kerr MRN: [id number] Date of Birth: 08-22-1923  Transition of Care [facility]) CM/SW Contact:  Alberteen Sam, LCSW Phone Number: 08/05/2022, 3:36 PM   Clinical Narrative:     CSW faxed PACE information for patient's discharge as requested at [phone] and notified social worker Emmie Niemann of dc today at [phone].  Final next level of care: Home/Self Care Barriers to Discharge: No Barriers Identified   Patient Goals and CMS Choice Patient states their goals for this hospitalization and ongoing recovery are:: to go home CMS Medicare.gov Compare Post Acute Care list provided to:: Patient Choice offered to / list presented to : Patient  Discharge Placement                    Patient and family notified of of transfer: 08/05/22  Discharge Plan and Services                                     Social Determinants of Health (SDOH) Interventions     Readmission Risk Interventions     No data to display

## 2022-08-05 NOTE — Evaluation (Signed)
Physical Therapy Evaluation Patient Details Name: Julia Kerr MRN: [id number] DOB: March 22, 1923 Today's Date: 08/05/2022  History of Present Illness  88 year old Spanish-speaking female with history of stage IIIb chronic kidney disease and secondary pneumonia who presented to the emergency room on the afternoon of 8/15 with complaints of weakness and dizziness, and according to patient's son has had diarrhea for the past month.  Hemoglobin found to be 5.7, recieved 2 units.  Clinical Impression  Pt did very well with PT exam and she was able to easily perform bed mobility, transfers and circumambulate the nurses station with consistent speed and cadence, no LOBs, fatigue or other concerns.  Pt feels she is close to her baseline and agrees that she does not need further PT follow up at discharge.   Recommendations for follow up therapy are one component of a multi-disciplinary discharge planning process, led by the attending physician.  Recommendations may be updated based on patient status, additional functional criteria and insurance authorization.  Follow Up Recommendations No PT follow up      Assistance Recommended at Discharge PRN  Patient can return home with the following  Assist for transportation    Equipment Recommendations None recommended by PT  Recommendations for Other Services       Functional Status Assessment Patient has not had a recent decline in their functional status     Precautions / Restrictions Precautions Precautions: Fall Restrictions Weight Bearing Restrictions: No      Mobility  Bed Mobility Overal bed mobility: Independent             General bed mobility comments: easily gets up to EOB w/o assist or hesitation    Transfers Overall transfer level: Modified independent Equipment used: Rolling walker (2 wheels)               General transfer comment: Pt was able to rise to standing safely and with confidence, no issues.     Ambulation/Gait Ambulation/Gait assistance: Modified independent (Device/Increase time) Gait Distance (Feet): 200 Feet Assistive device: Rolling walker (2 wheels)         General Gait Details: Pt was able to circumambulate the nurses' station w/o issue, she maintained consistent speed, minimal UE reliance, appropriate vitals and no LOBs or overt safety issues.  Stairs            Wheelchair Mobility    Modified Rankin (Stroke Patients Only)       Balance Overall balance assessment: Independent                                           Pertinent Vitals/Pain Pain Assessment Pain Assessment: No/denies pain    Home Living Family/patient expects to be discharged to:: Private residence Living Arrangements: Alone (son lives close by and helps as much as needed) Available Help at Discharge: Family;Available 24 hours/day Type of Home: House Home Access: Level entry       Home Layout: One level Home Equipment: Shower seat;Wheelchair - manual;Cane - single Barista (2 wheels)      Prior Function Prior Level of Function : Independent/Modified Independent             Mobility Comments: Reports she is active and out of the house regualrly, typically using SPC ADLs Comments: doesn't drive, but will run errands with son when she wants, reports she does not need assist with dressing, bathing, cooking,  etc     Hand Dominance        Extremity/Trunk Assessment   Upper Extremity Assessment Upper Extremity Assessment: Overall WFL for tasks assessed;Generalized weakness    Lower Extremity Assessment Lower Extremity Assessment: Overall WFL for tasks assessed;Generalized weakness       Communication   Communication: Prefers language other than [place];Other (comment);Interpreter utilized (tele interp: Barnie Alderman [phone])  Cognition Arousal/Alertness: Awake/alert Behavior During Therapy: WFL for tasks assessed/performed Overall Cognitive  Status: Within Functional Limits for tasks assessed                                 General Comments: Pt tended to occasionally get side tracked talking with interpreter but appeared to be alert and oriented and able to follow cues appropriately        General Comments General comments (skin integrity, edema, etc.): Pt was very happy to get up and walk, did very well and showed great safety and confidence.    Exercises     Assessment/Plan    PT Assessment Patient does not need any further PT services  PT Problem List Decreased strength;Decreased activity tolerance;Decreased knowledge of use of DME       PT Treatment Interventions      PT Goals (Current goals can be found in the Care Plan section)  Acute Rehab PT Goals Patient Stated Goal: go home PT Goal Formulation: With patient Time For Goal Achievement: 08/18/22 Potential to Achieve Goals: Good    Frequency       Co-evaluation               AM-PAC PT "6 Clicks" Mobility  Outcome Measure Help needed turning from your back to your side while in a flat bed without using bedrails?: None Help needed moving from lying on your back to sitting on the side of a flat bed without using bedrails?: None Help needed moving to and from a bed to a chair (including a wheelchair)?: None Help needed standing up from a chair using your arms (e.g., wheelchair or bedside chair)?: None Help needed to walk in hospital room?: None Help needed climbing 3-5 steps with a railing? : A Little 6 Click Score: 23    End of Session Equipment Utilized During Treatment: Gait belt Activity Tolerance: Patient tolerated treatment well Patient left: with chair alarm set;with call bell/phone within reach Nurse Communication: Mobility status (removed purewick, pt knows to call) PT Visit Diagnosis: Muscle weakness (generalized) (M62.81)    Time: 4128-7867 PT Time Calculation (min) (ACUTE ONLY): 28 min   Charges:   PT  Evaluation $PT Eval Low Complexity: 1 Low          Kreg Shropshire, DPT 08/05/2022, 1:05 PM

## 2022-08-05 NOTE — Discharge Summary (Signed)
Physician Discharge Summary   Patient: Julia Kerr MRN: [id number] DOB: Apr 15, 1923  Admit date:     08/03/2022  Discharge date: 08/05/22  Discharge Physician: Annita Brod   PCP: [name], PA-C   Recommendations at discharge:   New medication: Multivitamin p.o. daily New medication: Boost breeze 3 times daily between meals  Discharge Diagnoses: Principal Problem:   Symptomatic anemia in the setting of in the from stage IIIb/IV chronic kidney disease Active Problems:   AKI (acute kidney injury) ([place]) in the setting of stage IIIb/IV chronic kidney disease   Protein-calorie malnutrition, severe   Underweight  Resolved Problems:   Diarrhea  Hospital Course: 88 year old Spanish-speaking female with history of stage IIIb chronic kidney disease and secondary pneumonia who presented to the emergency room on the afternoon of 8/15 with complaints of weakness and dizziness, and according to patient's son has had diarrhea for the past month.  Hemoglobin found to be 5.7 (baseline around 8) and acute kidney injury with a creatinine of 2.2 (baseline around 1.7).  Transfused 2 units packed red blood cells.  Following transfusion, hemoglobin at 9.0 by 8/16.  Seen by physical therapy and cleared.  Patient seen by nutrition and felt to have severe protein calorie malnutrition and placed on multivitamin and Ensure twice daily.  Assessment and Plan: * Symptomatic anemia in the setting of in the from stage IIIb/IV chronic kidney disease No evidence of blood loss.  Felt to be secondary to her chronic kidney disease and she had missed an infusion session.  Received 2 units packed red blood cells and within 10 hours, hemoglobin up to 9.0 from 5.7.  Repeat hemoglobin 12 hours later higher at 9.6, and has stayed stable.  Given patient's advanced age and explainable cause for anemia, no invention invasive procedure done.  AKI (acute kidney injury) ([place]) in the setting of stage IIIb/IV chronic kidney  disease .  Secondary to hypotension from anemia.  Renal function continues to improve.- Patient's baseline renal function is 1.51-1.73, GFR of 27-31 over the last 5 months.  Creatinine today at 1.89 with GFR of 24.  Diarrhea-resolved as of 08/05/2022 - C. difficile and GI panel both negative.  Diarrhea has resolved during hospitalization.  Protein-calorie malnutrition, severe Nutrition Status: Nutrition Problem: Severe Malnutrition Etiology: chronic illness (CKD) Signs/Symptoms: moderate fat depletion, severe fat depletion, moderate muscle depletion, severe muscle depletion Interventions: Boost breeze 3 times daily between meals, MVI, Liberalize Diet \  Underweight Meets criteria with BMI greater than BMI of 17.9.  Nutrition consulted.  Anemia of chronic kidney failure, stage 4 (severe) (HCC) - Patient gets Retacrit (epoetin) every 2 weeks - Patient missed the last dose, July 22, 2022 as she was in [place] - Patient's baseline hemoglobin is 7.9-8.7 - Recheck hemoglobin at 22:30 on day of admission - CBC in the a.m.  Stage 3b chronic kidney disease ([place]) - Gradually worsening - Baseline serum creatinine is 1.5-1.97, GFR 29-31 - BMP in the a.m.         Consultants: None Procedures performed: None Disposition: Home Diet recommendation:  Discharge Diet Orders (From admission, onward)     Start     Ordered   08/05/22 0000  Diet - low sodium heart healthy        08/05/22 1459           Regular diet with Ensure twice daily between meals DISCHARGE MEDICATION: Allergies as of 08/05/2022       Reactions   Food Other (See Comments)  Apples and strawberries cause burning of lips        Medication List     STOP taking these medications    hydrOXYzine 10 MG tablet Commonly known as: ATARAX       TAKE these medications    multivitamin with minerals Tabs tablet Take 1 tablet by mouth daily. Start taking on: August 06, 2022   VISINE DRY EYE RELIEF  OP Place 1 drop into both eyes as needed (dry eyes).        Discharge Exam: Filed Weights   08/03/22 1516  Weight: 36.3 kg   General: Alert and oriented x2, no acute distress Cardiovascular: Regular rate and rhythm, S1-S2 Lungs: Clear to auscultation bilaterally  Condition at discharge: good  The results of significant diagnostics from this hospitalization (including imaging, microbiology, ancillary and laboratory) are listed below for reference.   Imaging Studies: US Venous Img Lower Bilateral (DVT)  Result Date: 08/04/2022 CLINICAL DATA:  Leg swelling/edema for months EXAM: BILATERAL LOWER EXTREMITY VENOUS DOPPLER ULTRASOUND TECHNIQUE: Gray-scale sonography with compression, as well as color and duplex ultrasound, were performed to evaluate the deep venous system(s) from the level of the common femoral vein through the popliteal and proximal calf veins. COMPARISON:  None Available. FINDINGS: VENOUS Normal compressibility of BILATERAL common femoral, superficial femoral, and popliteal veins, as well as the visualized calf veins. Visualized portions of BILATERAL profunda femoral vein and great saphenous vein unremarkable. No filling defects to suggest DVT on grayscale or color Doppler imaging. Doppler waveforms show normal direction of venous flow, normal respiratory plasticity and response to augmentation. Limited views of the contralateral common femoral vein are unremarkable. OTHER None. Limitations: none IMPRESSION: No evidence of deep venous thrombosis in either lower extremity. Electronically Signed   By: Lavonia Dana M.D.   On: 08/04/2022 10:29   DG Chest Port 1 View  Result Date: 08/03/2022 CLINICAL DATA:  Dyspnea on exertion. EXAM: PORTABLE CHEST 1 VIEW COMPARISON:  May 05, 2021 FINDINGS: The heart size and mediastinal contours are within normal limits. There is mild calcification of the aortic arch and tortuosity of the descending thoracic aorta. Diffuse, chronic appearing  increased interstitial lung markings are seen. There is no evidence of acute infiltrate, pleural effusion or pneumothorax. A radiopaque fusion plate and screws are seen within the proximal right humerus. Degenerative changes seen throughout the thoracic spine. IMPRESSION: Chronic appearing increased interstitial lung markings without evidence of acute or active cardiopulmonary disease. Electronically Signed   By: Virgina Norfolk M.D.   On: 08/03/2022 19:36    Microbiology: Results for orders placed or performed during the hospital encounter of 02/19/22  Resp Panel by RT-PCR (Flu A&B, Covid) Nasopharyngeal Swab     Status: None   Collection Time: 02/19/22  9:01 PM   Specimen: Nasopharyngeal Swab; Nasopharyngeal(NP) swabs in vial transport medium  Result Value Ref Range Status   SARS Coronavirus 2 by RT PCR NEGATIVE NEGATIVE Final    Comment: (NOTE) SARS-CoV-2 target nucleic acids are NOT DETECTED.  The SARS-CoV-2 RNA is generally detectable in upper respiratory specimens during the acute phase of infection. The lowest concentration of SARS-CoV-2 viral copies this assay can detect is 138 copies/mL. A negative result does not preclude SARS-Cov-2 infection and should not be used as the sole basis for treatment or other patient management decisions. A negative result may occur with  improper specimen collection/handling, submission of specimen other than nasopharyngeal swab, presence of viral mutation(s) within the areas targeted by this assay, and inadequate  number of viral copies(<138 copies/mL). A negative result must be combined with clinical observations, patient history, and epidemiological information. The expected result is Negative.  Fact Sheet for Patients:  EntrepreneurPulse.com.au  Fact Sheet for Healthcare Providers:  IncredibleEmployment.be  This test is no t yet approved or cleared by the [place] FDA and  has been authorized for  detection and/or diagnosis of SARS-CoV-2 by FDA under an Emergency Use Authorization (EUA). This EUA will remain  in effect (meaning this test can be used) for the duration of the COVID-19 declaration under Section 564(b)(1) of the Act, 21 U.S.C.section 360bbb-3(b)(1), unless the authorization is terminated  or revoked sooner.       Influenza A by PCR NEGATIVE NEGATIVE Final   Influenza B by PCR NEGATIVE NEGATIVE Final    Comment: (NOTE) The Xpert Xpress SARS-CoV-2/FLU/RSV plus assay is intended as an aid in the diagnosis of influenza from Nasopharyngeal swab specimens and should not be used as a sole basis for treatment. Nasal washings and aspirates are unacceptable for Xpert Xpress SARS-CoV-2/FLU/RSV testing.  Fact Sheet for Patients: EntrepreneurPulse.com.au  Fact Sheet for Healthcare Providers: IncredibleEmployment.be  This test is not yet approved or cleared by the [place] FDA and has been authorized for detection and/or diagnosis of SARS-CoV-2 by FDA under an Emergency Use Authorization (EUA). This EUA will remain in effect (meaning this test can be used) for the duration of the COVID-19 declaration under Section 564(b)(1) of the Act, 21 U.S.C. section 360bbb-3(b)(1), unless the authorization is terminated or revoked.  Performed at [facility], [address] [place], [place] [address]     Labs: CBC: Recent Labs  Lab 08/03/22 1519 08/04/22 0155 08/04/22 1133 08/05/22 0539  WBC 13.8* 12.8* 10.9* 12.4*  NEUTROABS 8.2*  --   --   --   HGB 5.7* 9.0* 9.6* 9.6*  HCT 20.1* 28.5* 30.5* 30.5*  MCV 97.1 87.2 87.4 87.9  PLT 214 201 204 419   Basic Metabolic Panel: Recent Labs  Lab 08/03/22 1519 08/04/22 0155 08/04/22 1133 08/05/22 0539  NA 140 141 140 139  K 4.4 4.4 4.7 4.1  CL 113* 115* 112* 112*  CO2 21* 22 21* 20*  GLUCOSE 125* 85 133* 84  BUN 63* 60* 59* 53*  CREATININE 2.22* 2.02* 1.95* 1.89*   CALCIUM 8.2* 8.1* 8.3* 8.3*   Liver Function Tests: Recent Labs  Lab 08/03/22 1519  AST 37  ALT 27  ALKPHOS 101  BILITOT 1.0  PROT 6.6  ALBUMIN 3.6   CBG: No results for input(s): "GLUCAP" in the last 168 hours.  Discharge time spent: less than 30 minutes.  Signed: Annita Brod, MD [facility] 08/05/2022

## 2022-08-06 ENCOUNTER — Encounter: Payer: Self-pay | Admitting: *Deleted

## 2022-09-01 ENCOUNTER — Encounter (HOSPITAL_COMMUNITY): Payer: Self-pay

## 2022-09-09 ENCOUNTER — Encounter: Payer: Self-pay | Admitting: Podiatry

## 2022-09-09 ENCOUNTER — Ambulatory Visit (INDEPENDENT_AMBULATORY_CARE_PROVIDER_SITE_OTHER): Payer: Medicare (Managed Care) | Admitting: Podiatry

## 2022-09-09 DIAGNOSIS — B351 Tinea unguium: Secondary | ICD-10-CM | POA: Diagnosis not present

## 2022-09-09 DIAGNOSIS — M79675 Pain in left toe(s): Secondary | ICD-10-CM | POA: Diagnosis not present

## 2022-09-09 DIAGNOSIS — M79674 Pain in right toe(s): Secondary | ICD-10-CM | POA: Diagnosis not present

## 2022-09-10 NOTE — Progress Notes (Signed)
Subjective:   Patient ID: Julia Kerr, female   DOB: 88 y.o.   MRN: [id number]   HPI Patient presents with elongated nails 1-5 both feet that she cannot cut and she does have a translator with her today and has some foot swelling that does not really bother her   ROS      Objective:  Physical Exam  Neurovascular status intact with thick incurvated nailbeds 1-5 both feet and mild swelling of both ankles     Assessment:  Chronic mycotic nail infection 1-5 both feet with pain along with swelling     Plan:  Debridement of the nailbeds 1-5 both feet neurogenic bleeding and do not recommend treatment for swelling as it is minimal and she does have advanced age

## 2022-09-29 ENCOUNTER — Other Ambulatory Visit: Payer: Self-pay

## 2022-09-29 ENCOUNTER — Inpatient Hospital Stay: Payer: Medicare (Managed Care)

## 2022-09-29 ENCOUNTER — Other Ambulatory Visit: Payer: Self-pay | Admitting: Hematology and Oncology

## 2022-09-29 ENCOUNTER — Inpatient Hospital Stay: Payer: Medicare (Managed Care) | Attending: Hematology and Oncology | Admitting: Hematology and Oncology

## 2022-09-29 VITALS — BP 141/57 | HR 78 | Temp 98.0°F | Resp 15 | Wt 82.5 lb

## 2022-09-29 DIAGNOSIS — D638 Anemia in other chronic diseases classified elsewhere: Secondary | ICD-10-CM

## 2022-09-29 DIAGNOSIS — N1832 Chronic kidney disease, stage 3b: Secondary | ICD-10-CM | POA: Diagnosis not present

## 2022-09-29 DIAGNOSIS — N189 Chronic kidney disease, unspecified: Secondary | ICD-10-CM | POA: Diagnosis present

## 2022-09-29 DIAGNOSIS — D631 Anemia in chronic kidney disease: Secondary | ICD-10-CM | POA: Insufficient documentation

## 2022-09-29 LAB — CBC WITH DIFFERENTIAL (CANCER CENTER ONLY)
Abs Immature Granulocytes: 0.19 10*3/uL — ABNORMAL HIGH (ref 0.00–0.07)
Basophils Absolute: 0 10*3/uL (ref 0.0–0.1)
Basophils Relative: 0 %
Eosinophils Absolute: 0 10*3/uL (ref 0.0–0.5)
Eosinophils Relative: 0 %
HCT: 24.7 % — ABNORMAL LOW (ref 36.0–46.0)
Hemoglobin: 7.3 g/dL — ABNORMAL LOW (ref 12.0–15.0)
Immature Granulocytes: 2 %
Lymphocytes Relative: 16 %
Lymphs Abs: 1.5 10*3/uL (ref 0.7–4.0)
MCH: 28.1 pg (ref 26.0–34.0)
MCHC: 29.6 g/dL — ABNORMAL LOW (ref 30.0–36.0)
MCV: 95 fL (ref 80.0–100.0)
Monocytes Absolute: 2.2 10*3/uL — ABNORMAL HIGH (ref 0.1–1.0)
Monocytes Relative: 23 %
Neutro Abs: 5.5 10*3/uL (ref 1.7–7.7)
Neutrophils Relative %: 59 %
Platelet Count: 129 10*3/uL — ABNORMAL LOW (ref 150–400)
RBC: 2.6 MIL/uL — ABNORMAL LOW (ref 3.87–5.11)
RDW: 20.2 % — ABNORMAL HIGH (ref 11.5–15.5)
Smear Review: NORMAL
WBC Count: 9.3 10*3/uL (ref 4.0–10.5)
nRBC: 1.1 % — ABNORMAL HIGH (ref 0.0–0.2)

## 2022-09-29 LAB — CMP (CANCER CENTER ONLY)
ALT: 9 U/L (ref 0–44)
AST: 18 U/L (ref 15–41)
Albumin: 4 g/dL (ref 3.5–5.0)
Alkaline Phosphatase: 120 U/L (ref 38–126)
Anion gap: 7 (ref 5–15)
BUN: 44 mg/dL — ABNORMAL HIGH (ref 8–23)
CO2: 22 mmol/L (ref 22–32)
Calcium: 8.4 mg/dL — ABNORMAL LOW (ref 8.9–10.3)
Chloride: 108 mmol/L (ref 98–111)
Creatinine: 1.87 mg/dL — ABNORMAL HIGH (ref 0.44–1.00)
GFR, Estimated: 24 mL/min — ABNORMAL LOW (ref >60)
Glucose, Bld: 131 mg/dL — ABNORMAL HIGH (ref 70–99)
Potassium: 4.1 mmol/L (ref 3.5–5.1)
Sodium: 137 mmol/L (ref 135–145)
Total Bilirubin: 1 mg/dL (ref 0.3–1.2)
Total Protein: 7 g/dL (ref 6.5–8.1)

## 2022-10-02 ENCOUNTER — Encounter (HOSPITAL_COMMUNITY): Payer: Self-pay

## 2022-10-02 NOTE — Progress Notes (Signed)
[place] Telephone:([phone]) [phone]   [phone]  PROGRESS NOTE  Patient Care Team: [name], Hershal Coria as PCP - General  Hematological/Oncological History # Normocytic Anemia 08/04/2018: WBC 4.2, Hgb 9.4, MCV 89, Plt 264 05/05/2021: WBC 12.2, Hgb 8.9, MCV 94.9, Plt 229 09/23/2021: WBC 7.2, Hgb 6.6, MCV 101.9, Plt 173. Ferritin 1059, TIBC 245, Iron sat 27%. Retc 4.3% 09/24/2021: WBC 10.1, Hgb 8.9, MCV 92, Plt 186 09/30/2021: establish care with Dr. Lorenso Courier   Interval History:  Julia Kerr 88 y.o. female with medical history significant for normocytic anemia and axillary lymphadenopathy who presents for a follow up visit. The patient's last visit was on 06/30/2022. In the interim since the last visit she has had no major changes in her health and reportedly has continued to receive EPO shots with her nephrologist.  On exam today Julia Kerr is accompanied by her son who is her legal guardian.  We also have a hospital approved interpreter with [place] today.  She reports that she goes to [facility] and she does follow-up with her nephrologist on a regular basis.  She does get EPO shots every 2 weeks per her report.  She notes that her hemoglobins are typically from the sevens to the nines.  She notes that she does feel sluggish and tired on occasion and does have some itching at night.  Fortunately she is not having any lightheadedness or dizziness.  She feels like she is eating well and has gained 1.5 pounds in the interim since her last visit.  She denies any headaches, vision changes, or upset stomach.  Overall she is at her baseline level of health.  She reports no fevers, chills, sweats, nausea, vomiting or diarrhea.  A full 10 point ROS is listed below.  MEDICAL HISTORY:  Past Medical History:  Diagnosis Date   Anemia     SURGICAL HISTORY: Past Surgical History:  Procedure Laterality Date   CATARACT EXTRACTION, BILATERAL     SHOULDER SURGERY      SOCIAL  HISTORY: Social History   Socioeconomic History   Marital status: Widowed    Spouse name: Not on file   Number of children: Not on file   Years of education: Not on file   Highest education level: Not on file  Occupational History   Not on file  Tobacco Use   Smoking status: Never   Smokeless tobacco: Never  Vaping Use   Vaping Use: Never used  Substance and Sexual Activity   Alcohol use: Never   Drug use: Never   Sexual activity: Not Currently  Other Topics Concern   Not on file  Social History Narrative   Not on file   Social Determinants of Health   Financial Resource Strain: Not on file  Food Insecurity: Not on file  Transportation Needs: Not on file  Physical Activity: Not on file  Stress: Not on file  Social Connections: Not on file  Intimate Partner Violence: Not on file    FAMILY HISTORY: No family history on file.  ALLERGIES:  is allergic to eggs-apples-oats [alitraq], food, strawberry (diagnostic), and strawberry extract.  MEDICATIONS:  Current Outpatient Medications  Medication Sig Dispense Refill   fexofenadine (ALLEGRA) 180 MG tablet Take 1 tablet by oral route in the evening for 30 days.     Multiple Vitamin (MULTIVITAMIN WITH MINERALS) TABS tablet Take 1 tablet by mouth daily. 30 tablet 1   Polyethylene Glycol 400 (VISINE DRY EYE RELIEF OP) Place 1 drop into both eyes  as needed (dry eyes).     No current facility-administered medications for this visit.    REVIEW OF SYSTEMS:   Constitutional: ( - ) fevers, ( - )  chills , ( - ) night sweats Eyes: ( - ) blurriness of vision, ( - ) double vision, ( - ) watery eyes Ears, nose, mouth, throat, and face: ( - ) mucositis, ( - ) sore throat Respiratory: ( - ) cough, ( - ) dyspnea, ( - ) wheezes Cardiovascular: ( - ) palpitation, ( - ) chest discomfort, ( - ) lower extremity swelling Gastrointestinal:  ( - ) nausea, ( - ) heartburn, ( - ) change in bowel habits Skin: ( - ) abnormal skin  rashes Lymphatics: ( - ) new lymphadenopathy, ( - ) easy bruising Neurological: ( - ) numbness, ( - ) tingling, ( - ) new weaknesses Behavioral/Psych: ( - ) mood change, ( - ) new changes  All other systems were reviewed with the patient and are negative.  PHYSICAL EXAMINATION:  Vitals:   09/29/22 1130  BP: (!) 141/57  Pulse: 78  Resp: 15  Temp: 98 F (36.7 C)  SpO2: 100%   Filed Weights   09/29/22 1130  Weight: 82 lb 8 oz (37.4 kg)    GENERAL: Well-appearing elderly Hispanic female, alert, no distress and comfortable SKIN: skin color, texture, turgor are normal, no rashes or significant lesions EYES: conjunctiva are pink and non-injected, sclera clear LYMPH:  no palpable lymphadenopathy in the cervical or inguinal LUNGS: clear to auscultation and percussion with normal breathing effort HEART: regular rate & rhythm and no murmurs and no lower extremity edema Musculoskeletal: no cyanosis of digits and no clubbing  PSYCH: alert & oriented x 3, fluent speech NEURO: no focal motor/sensory deficits  LABORATORY DATA:  I have reviewed the data as listed    Latest Ref Rng & Units 09/29/2022   10:19 AM 08/05/2022    5:39 AM 08/04/2022   11:33 AM  CBC  WBC 4.0 - 10.5 K/uL 9.3  12.4  10.9   Hemoglobin 12.0 - 15.0 g/dL 7.3  9.6  9.6   Hematocrit 36.0 - 46.0 % 24.7  30.5  30.5   Platelets 150 - 400 K/uL 129  211  204        Latest Ref Rng & Units 09/29/2022   10:19 AM 08/05/2022    5:39 AM 08/04/2022   11:33 AM  CMP  Glucose 70 - 99 mg/dL 131  84  133   BUN 8 - 23 mg/dL 44  53  59   Creatinine 0.44 - 1.00 mg/dL 1.87  1.89  1.95   Sodium 135 - 145 mmol/L 137  139  140   Potassium 3.5 - 5.1 mmol/L 4.1  4.1  4.7   Chloride 98 - 111 mmol/L 108  112  112   CO2 22 - 32 mmol/L 22  20  21    Calcium 8.9 - 10.3 mg/dL 8.4  8.3  8.3   Total Protein 6.5 - 8.1 g/dL 7.0     Total Bilirubin 0.3 - 1.2 mg/dL 1.0     Alkaline Phos 38 - 126 U/L 120     AST 15 - 41 U/L 18     ALT 0 - 44 U/L  9       Lab Results  Component Value Date   MPROTEIN Not Observed 09/30/2021    RADIOGRAPHIC STUDIES: No results found.  ASSESSMENT & PLAN [facility] 88 y.o.  female with medical history significant for normocytic anemia and CKD who presents for a follow up visit.  #Anemia 2/2 to CKD -- We will discuss with nephrologist what dose of EPO she is receiving at this time --Labs today show white blood cell count 9.3, hemoglobin 7.3, MCV 95.0, and platelets 129 --Iron labs appear consistent with anemia of chronic disease with markedly high ferritin and low total iron-binding opacity --We will need to consider bone marrow biopsy if hemoglobin levels or not improving with high levels of EPO --Patient would like to consider bone marrow biopsy.  She would like some time to think this over and does not wish to pursue it at this time. --RTC in 3 months time.   #Leukocytosis-resolved -- White blood cell count [id number], neutrophilic predominance --Likely secondary to inflammation of the elbow --White blood cell count 9.3 today, resolved.  # Bilateral Axillary Lymphadenopathy-resolved -- Continue to monitor   No orders of the defined types were placed in this encounter.   All questions were answered. The patient knows to call the clinic with any problems, questions or concerns.  A total of more than 30 minutes were spent on this encounter with face-to-face time and non-face-to-face time, including preparing to see the patient, ordering tests and/or medications, counseling the patient and coordination of care as outlined above.   Ledell Peoples, MD Department of Hematology/Oncology [place] at [facility] Phone: [phone] Pager: [phone] Email: [email or web address]  10/02/2022 5:23 PM

## 2022-10-05 ENCOUNTER — Other Ambulatory Visit (HOSPITAL_COMMUNITY): Payer: Self-pay | Admitting: *Deleted

## 2022-10-05 DIAGNOSIS — D631 Anemia in chronic kidney disease: Secondary | ICD-10-CM

## 2022-10-06 ENCOUNTER — Ambulatory Visit (HOSPITAL_COMMUNITY)
Admission: RE | Admit: 2022-10-06 | Discharge: 2022-10-06 | Disposition: A | Discharge status: Home / Self Care | Payer: Medicare (Managed Care) | Source: Ambulatory Visit | Attending: Nephrology | Admitting: Nephrology

## 2022-10-06 DIAGNOSIS — D631 Anemia in chronic kidney disease: Secondary | ICD-10-CM | POA: Insufficient documentation

## 2022-10-06 DIAGNOSIS — N1832 Chronic kidney disease, stage 3b: Secondary | ICD-10-CM | POA: Insufficient documentation

## 2022-10-06 LAB — PREPARE RBC (CROSSMATCH)

## 2022-10-06 MED ORDER — SODIUM CHLORIDE 0.9% IV SOLUTION: Freq: Once | INTRAVENOUS | Status: DC

## 2022-10-07 LAB — BPAM RBC
Blood Product Expiration Date: 202311192359
Blood Product Expiration Date: 202311192359
ISSUE DATE / TIME: 202310181046
ISSUE DATE / TIME: 202310181256
Unit Type and Rh: 6200
Unit Type and Rh: 6200

## 2022-10-07 LAB — TYPE AND SCREEN
ABO/RH(D): A POS
Antibody Screen: NEGATIVE
Unit division: 0
Unit division: 0

## 2022-11-26 ENCOUNTER — Other Ambulatory Visit (HOSPITAL_COMMUNITY): Payer: Self-pay

## 2022-11-26 DIAGNOSIS — D649 Anemia, unspecified: Secondary | ICD-10-CM

## 2022-11-29 ENCOUNTER — Ambulatory Visit (HOSPITAL_COMMUNITY)
Admission: RE | Admit: 2022-11-29 | Discharge: 2022-11-29 | Disposition: A | Discharge status: Home / Self Care | Payer: Medicare (Managed Care) | Source: Ambulatory Visit | Attending: Family Medicine | Admitting: Family Medicine

## 2022-11-29 VITALS — BP 131/77 | HR 90 | Temp 98.5°F | Resp 18

## 2022-11-29 DIAGNOSIS — D631 Anemia in chronic kidney disease: Secondary | ICD-10-CM | POA: Insufficient documentation

## 2022-11-29 DIAGNOSIS — N184 Chronic kidney disease, stage 4 (severe): Secondary | ICD-10-CM | POA: Insufficient documentation

## 2022-11-29 DIAGNOSIS — D649 Anemia, unspecified: Secondary | ICD-10-CM | POA: Insufficient documentation

## 2022-11-29 LAB — CBC WITH DIFFERENTIAL/PLATELET
Abs Immature Granulocytes: 0.51 10*3/uL — ABNORMAL HIGH (ref 0.00–0.07)
Basophils Absolute: 0 10*3/uL (ref 0.0–0.1)
Basophils Relative: 0 %
Eosinophils Absolute: 0 10*3/uL (ref 0.0–0.5)
Eosinophils Relative: 0 %
HCT: 31.7 % — ABNORMAL LOW (ref 36.0–46.0)
Hemoglobin: 10.5 g/dL — ABNORMAL LOW (ref 12.0–15.0)
Immature Granulocytes: 4 %
Lymphocytes Relative: 20 %
Lymphs Abs: 2.3 10*3/uL (ref 0.7–4.0)
MCH: 29.2 pg (ref 26.0–34.0)
MCHC: 33.1 g/dL (ref 30.0–36.0)
MCV: 88.1 fL (ref 80.0–100.0)
Monocytes Absolute: 2.5 10*3/uL — ABNORMAL HIGH (ref 0.1–1.0)
Monocytes Relative: 21 %
Neutro Abs: 6.5 10*3/uL (ref 1.7–7.7)
Neutrophils Relative %: 55 %
Platelets: 167 10*3/uL (ref 150–400)
RBC: 3.6 MIL/uL — ABNORMAL LOW (ref 3.87–5.11)
RDW: 17.5 % — ABNORMAL HIGH (ref 11.5–15.5)
WBC: 11.8 10*3/uL — ABNORMAL HIGH (ref 4.0–10.5)
nRBC: 0.5 % — ABNORMAL HIGH (ref 0.0–0.2)

## 2022-11-29 LAB — DIFFERENTIAL
Abs Immature Granulocytes: 0.62 10*3/uL — ABNORMAL HIGH (ref 0.00–0.07)
Basophils Absolute: 0 10*3/uL (ref 0.0–0.1)
Basophils Relative: 0 %
Eosinophils Absolute: 0 10*3/uL (ref 0.0–0.5)
Eosinophils Relative: 0 %
Immature Granulocytes: 6 %
Lymphocytes Relative: 14 %
Lymphs Abs: 1.5 10*3/uL (ref 0.7–4.0)
Monocytes Absolute: 2.8 10*3/uL — ABNORMAL HIGH (ref 0.1–1.0)
Monocytes Relative: 28 %
Neutro Abs: 5.3 10*3/uL (ref 1.7–7.7)
Neutrophils Relative %: 52 %
Smear Review: NORMAL

## 2022-11-29 LAB — CBC
HCT: 22.1 % — ABNORMAL LOW (ref 36.0–46.0)
Hemoglobin: 6.2 g/dL — CL (ref 12.0–15.0)
MCH: 27.1 pg (ref 26.0–34.0)
MCHC: 28.1 g/dL — ABNORMAL LOW (ref 30.0–36.0)
MCV: 96.5 fL (ref 80.0–100.0)
Platelets: 154 10*3/uL (ref 150–400)
RBC: 2.29 MIL/uL — ABNORMAL LOW (ref 3.87–5.11)
RDW: 18.6 % — ABNORMAL HIGH (ref 11.5–15.5)
WBC: 10.3 10*3/uL (ref 4.0–10.5)
nRBC: 0.9 % — ABNORMAL HIGH (ref 0.0–0.2)

## 2022-11-29 LAB — PREPARE RBC (CROSSMATCH)

## 2022-11-29 MED ORDER — DIPHENHYDRAMINE HCL 25 MG PO CAPS: 25.0000 mg | ORAL_CAPSULE | Freq: Two times a day (BID) | ORAL | Status: DC | PRN

## 2022-11-29 MED ORDER — ACETAMINOPHEN 325 MG PO TABS: 650.0000 mg | ORAL_TABLET | Freq: Four times a day (QID) | ORAL | Status: DC | PRN

## 2022-11-29 MED ORDER — SODIUM CHLORIDE 0.9% IV SOLUTION: Freq: Once | INTRAVENOUS | Status: DC

## 2022-11-30 LAB — TYPE AND SCREEN
ABO/RH(D): A POS
Antibody Screen: NEGATIVE
Unit division: 0
Unit division: 0

## 2022-11-30 LAB — BPAM RBC
Blood Product Expiration Date: 202401072359
Blood Product Expiration Date: 202401072359
ISSUE DATE / TIME: 202312110947
ISSUE DATE / TIME: 202312111300
Unit Type and Rh: 6200
Unit Type and Rh: 6200

## 2022-12-01 LAB — PATHOLOGIST SMEAR REVIEW

## 2022-12-07 ENCOUNTER — Ambulatory Visit: Payer: Medicare Other | Admitting: Dermatology

## 2023-01-05 ENCOUNTER — Telehealth (HOSPITAL_COMMUNITY): Payer: Self-pay

## 2023-01-05 NOTE — Telephone Encounter (Signed)
Left VM with clinical staff at [facility] regarding orders for blood transfusion for this pt, clarifying if Dr. Carolin Sicks will be the ordering provider. Nurses station phone number provided .

## 2023-01-06 ENCOUNTER — Non-Acute Institutional Stay (HOSPITAL_COMMUNITY)
Admission: RE | Admit: 2023-01-06 | Discharge: 2023-01-06 | Disposition: A | Discharge status: Home / Self Care | Payer: Medicare (Managed Care) | Source: Ambulatory Visit | Attending: Internal Medicine | Admitting: Internal Medicine

## 2023-01-06 VITALS — BP 166/54 | HR 94 | Temp 98.3°F | Resp 16

## 2023-01-06 DIAGNOSIS — D649 Anemia, unspecified: Secondary | ICD-10-CM | POA: Insufficient documentation

## 2023-01-06 LAB — PREPARE RBC (CROSSMATCH)

## 2023-01-06 LAB — CBC
HCT: 25.4 % — ABNORMAL LOW (ref 36.0–46.0)
Hemoglobin: 7.6 g/dL — ABNORMAL LOW (ref 12.0–15.0)
MCH: 27.8 pg (ref 26.0–34.0)
MCHC: 29.9 g/dL — ABNORMAL LOW (ref 30.0–36.0)
MCV: 93 fL (ref 80.0–100.0)
Platelets: 175 10*3/uL (ref 150–400)
RBC: 2.73 MIL/uL — ABNORMAL LOW (ref 3.87–5.11)
RDW: 18.1 % — ABNORMAL HIGH (ref 11.5–15.5)
WBC: 18.1 10*3/uL — ABNORMAL HIGH (ref 4.0–10.5)
nRBC: 1.1 % — ABNORMAL HIGH (ref 0.0–0.2)

## 2023-01-06 MED ORDER — SODIUM CHLORIDE 0.9% IV SOLUTION: Freq: Once | INTRAVENOUS | Status: AC

## 2023-01-06 NOTE — Progress Notes (Signed)
PATIENT CARE CENTER NOTE   Diagnosis: Anemia    Provider: Sheran Spine, MD   Procedure: 1 unit PRBC and labs   Note: Patient received 1 unit PRBC via PIV. Labs drawn (Type & Screen and CBC) prior to infusion. Hemoglobin 7.6 pre-transfusion. Patient tolerated blood transfusion well with no adverse reaction. Vital signs stable. Discharge instructions given. Patient alert, oriented and transferred in personal wheelchair at discharge. Discharged home with son.

## 2023-01-07 ENCOUNTER — Encounter: Payer: Self-pay | Admitting: Hematology and Oncology

## 2023-01-07 LAB — TYPE AND SCREEN
ABO/RH(D): A POS
Antibody Screen: NEGATIVE
Unit division: 0

## 2023-01-07 LAB — BPAM RBC
Blood Product Expiration Date: 202402122359
ISSUE DATE / TIME: 202401181318
Unit Type and Rh: 6200

## 2023-01-28 ENCOUNTER — Emergency Department: Payer: Medicare (Managed Care)

## 2023-01-28 ENCOUNTER — Other Ambulatory Visit: Payer: Self-pay

## 2023-01-28 ENCOUNTER — Inpatient Hospital Stay
Admission: EM | Admit: 2023-01-28 | Discharge: 2023-01-30 | DRG: 194 | Disposition: A | Discharge status: Home Health | Payer: Medicare (Managed Care) | Attending: Internal Medicine | Admitting: Internal Medicine

## 2023-01-28 DIAGNOSIS — D696 Thrombocytopenia, unspecified: Secondary | ICD-10-CM | POA: Diagnosis present

## 2023-01-28 DIAGNOSIS — Z9842 Cataract extraction status, left eye: Secondary | ICD-10-CM | POA: Diagnosis not present

## 2023-01-28 DIAGNOSIS — Z91012 Allergy to eggs: Secondary | ICD-10-CM

## 2023-01-28 DIAGNOSIS — D631 Anemia in chronic kidney disease: Secondary | ICD-10-CM | POA: Diagnosis present

## 2023-01-28 DIAGNOSIS — Z79899 Other long term (current) drug therapy: Secondary | ICD-10-CM | POA: Diagnosis not present

## 2023-01-28 DIAGNOSIS — E872 Acidosis, unspecified: Secondary | ICD-10-CM | POA: Diagnosis present

## 2023-01-28 DIAGNOSIS — E871 Hypo-osmolality and hyponatremia: Secondary | ICD-10-CM | POA: Diagnosis present

## 2023-01-28 DIAGNOSIS — I4510 Unspecified right bundle-branch block: Secondary | ICD-10-CM | POA: Diagnosis present

## 2023-01-28 DIAGNOSIS — M545 Low back pain, unspecified: Secondary | ICD-10-CM | POA: Diagnosis present

## 2023-01-28 DIAGNOSIS — F32A Depression, unspecified: Secondary | ICD-10-CM | POA: Diagnosis present

## 2023-01-28 DIAGNOSIS — Z9841 Cataract extraction status, right eye: Secondary | ICD-10-CM | POA: Diagnosis not present

## 2023-01-28 DIAGNOSIS — N1832 Chronic kidney disease, stage 3b: Secondary | ICD-10-CM | POA: Diagnosis present

## 2023-01-28 DIAGNOSIS — Z91018 Allergy to other foods: Secondary | ICD-10-CM

## 2023-01-28 DIAGNOSIS — L89891 Pressure ulcer of other site, stage 1: Secondary | ICD-10-CM | POA: Diagnosis present

## 2023-01-28 DIAGNOSIS — J189 Pneumonia, unspecified organism: Secondary | ICD-10-CM | POA: Diagnosis not present

## 2023-01-28 DIAGNOSIS — R197 Diarrhea, unspecified: Secondary | ICD-10-CM | POA: Diagnosis present

## 2023-01-28 DIAGNOSIS — J129 Viral pneumonia, unspecified: Secondary | ICD-10-CM | POA: Diagnosis not present

## 2023-01-28 DIAGNOSIS — D649 Anemia, unspecified: Secondary | ICD-10-CM | POA: Diagnosis present

## 2023-01-28 DIAGNOSIS — L89611 Pressure ulcer of right heel, stage 1: Secondary | ICD-10-CM | POA: Diagnosis present

## 2023-01-28 DIAGNOSIS — Z66 Do not resuscitate: Secondary | ICD-10-CM | POA: Diagnosis present

## 2023-01-28 DIAGNOSIS — L8990 Pressure ulcer of unspecified site, unspecified stage: Secondary | ICD-10-CM | POA: Insufficient documentation

## 2023-01-28 LAB — TYPE AND SCREEN
ABO/RH(D): A POS
Antibody Screen: NEGATIVE

## 2023-01-28 LAB — CBC WITH DIFFERENTIAL/PLATELET
Abs Immature Granulocytes: 0.74 10*3/uL — ABNORMAL HIGH (ref 0.00–0.07)
Basophils Absolute: 0 10*3/uL (ref 0.0–0.1)
Basophils Relative: 0 %
Eosinophils Absolute: 0 10*3/uL (ref 0.0–0.5)
Eosinophils Relative: 0 %
HCT: 26.8 % — ABNORMAL LOW (ref 36.0–46.0)
Hemoglobin: 8.2 g/dL — ABNORMAL LOW (ref 12.0–15.0)
Immature Granulocytes: 4 %
Lymphocytes Relative: 11 %
Lymphs Abs: 1.9 10*3/uL (ref 0.7–4.0)
MCH: 28.5 pg (ref 26.0–34.0)
MCHC: 30.6 g/dL (ref 30.0–36.0)
MCV: 93.1 fL (ref 80.0–100.0)
Monocytes Absolute: 5.6 10*3/uL — ABNORMAL HIGH (ref 0.1–1.0)
Monocytes Relative: 34 %
Neutro Abs: 8.4 10*3/uL — ABNORMAL HIGH (ref 1.7–7.7)
Neutrophils Relative %: 51 %
Platelets: 106 10*3/uL — ABNORMAL LOW (ref 150–400)
RBC: 2.88 MIL/uL — ABNORMAL LOW (ref 3.87–5.11)
RDW: 18.1 % — ABNORMAL HIGH (ref 11.5–15.5)
Smear Review: NORMAL
WBC: 16.6 10*3/uL — ABNORMAL HIGH (ref 4.0–10.5)
nRBC: 0.5 % — ABNORMAL HIGH (ref 0.0–0.2)

## 2023-01-28 LAB — BASIC METABOLIC PANEL
Anion gap: 11 (ref 5–15)
BUN: 44 mg/dL — ABNORMAL HIGH (ref 8–23)
CO2: 19 mmol/L — ABNORMAL LOW (ref 22–32)
Calcium: 8.1 mg/dL — ABNORMAL LOW (ref 8.9–10.3)
Chloride: 98 mmol/L (ref 98–111)
Creatinine, Ser: 1.61 mg/dL — ABNORMAL HIGH (ref 0.44–1.00)
GFR, Estimated: 29 mL/min — ABNORMAL LOW (ref >60)
Glucose, Bld: 163 mg/dL — ABNORMAL HIGH (ref 70–99)
Potassium: 4.4 mmol/L (ref 3.5–5.1)
Sodium: 128 mmol/L — ABNORMAL LOW (ref 135–145)

## 2023-01-28 MED ORDER — ENOXAPARIN SODIUM 40 MG/0.4ML IJ SOSY: 40.0000 mg | PREFILLED_SYRINGE | INTRAMUSCULAR | Status: DC

## 2023-01-28 MED ORDER — ACETAMINOPHEN 325 MG PO TABS
650.0000 mg | ORAL_TABLET | Freq: Once | ORAL | Status: AC
  Administered 2023-01-28: 650 mg via ORAL
  Filled 2023-01-28: qty 2

## 2023-01-28 MED ORDER — ADULT MULTIVITAMIN W/MINERALS CH
1.0000 | ORAL_TABLET | Freq: Every day | ORAL | Status: DC
  Administered 2023-01-29 – 2023-01-30 (×2): 1 via ORAL
  Filled 2023-01-28 (×2): qty 1

## 2023-01-28 MED ORDER — SODIUM CHLORIDE 0.9 % IV BOLUS
500.0000 mL | Freq: Once | INTRAVENOUS | Status: AC
  Administered 2023-01-28: 500 mL via INTRAVENOUS

## 2023-01-28 MED ORDER — MAGNESIUM HYDROXIDE 400 MG/5ML PO SUSP: 30.0000 mL | Freq: Every day | ORAL | Status: DC | PRN

## 2023-01-28 MED ORDER — ONDANSETRON HCL 4 MG/2ML IJ SOLN: 4.0000 mg | Freq: Four times a day (QID) | INTRAMUSCULAR | Status: DC | PRN

## 2023-01-28 MED ORDER — IPRATROPIUM-ALBUTEROL 0.5-2.5 (3) MG/3ML IN SOLN
3.0000 mL | Freq: Four times a day (QID) | RESPIRATORY_TRACT | Status: DC
  Administered 2023-01-29: 3 mL via RESPIRATORY_TRACT
  Filled 2023-01-28: qty 3

## 2023-01-28 MED ORDER — SODIUM CHLORIDE 0.9 % IV SOLN
1.0000 g | Freq: Once | INTRAVENOUS | Status: AC
  Administered 2023-01-28: 1 g via INTRAVENOUS
  Filled 2023-01-28: qty 10

## 2023-01-28 MED ORDER — LORATADINE 10 MG PO TABS: 10.0000 mg | ORAL_TABLET | Freq: Every day | ORAL | Status: DC

## 2023-01-28 MED ORDER — ACETAMINOPHEN 325 MG PO TABS
650.0000 mg | ORAL_TABLET | Freq: Four times a day (QID) | ORAL | Status: DC | PRN
  Administered 2023-01-29: 650 mg via ORAL
  Filled 2023-01-28: qty 2

## 2023-01-28 MED ORDER — SODIUM CHLORIDE 0.9 % IV SOLN
500.0000 mg | Freq: Once | INTRAVENOUS | Status: DC
  Administered 2023-01-29: 500 mg via INTRAVENOUS
  Filled 2023-01-28: qty 5

## 2023-01-28 MED ORDER — ESCITALOPRAM OXALATE 10 MG PO TABS
5.0000 mg | ORAL_TABLET | Freq: Every day | ORAL | Status: DC
  Administered 2023-01-29 – 2023-01-30 (×2): 5 mg via ORAL
  Filled 2023-01-28 (×2): qty 1

## 2023-01-28 MED ORDER — POLYVINYL ALCOHOL 1.4 % OP SOLN: OPHTHALMIC | Status: DC | PRN

## 2023-01-28 MED ORDER — ACETAMINOPHEN 650 MG RE SUPP: 650.0000 mg | Freq: Four times a day (QID) | RECTAL | Status: DC | PRN

## 2023-01-28 MED ORDER — SODIUM CHLORIDE 0.9 % IV SOLN
500.0000 mg | INTRAVENOUS | Status: DC
  Filled 2023-01-28: qty 5

## 2023-01-28 MED ORDER — TRAZODONE HCL 50 MG PO TABS: 25.0000 mg | ORAL_TABLET | Freq: Every evening | ORAL | Status: DC | PRN

## 2023-01-28 MED ORDER — ONDANSETRON HCL 4 MG PO TABS: 4.0000 mg | ORAL_TABLET | Freq: Four times a day (QID) | ORAL | Status: DC | PRN

## 2023-01-28 MED ORDER — SODIUM CHLORIDE 0.9 % IV SOLN
2.0000 g | INTRAVENOUS | Status: DC
  Administered 2023-01-29 – 2023-01-30 (×2): 2 g via INTRAVENOUS
  Filled 2023-01-28 (×2): qty 20

## 2023-01-28 MED ORDER — SODIUM CHLORIDE 0.9 % IV SOLN: INTRAVENOUS | Status: DC

## 2023-01-28 MED ORDER — GUAIFENESIN ER 600 MG PO TB12
600.0000 mg | ORAL_TABLET | Freq: Two times a day (BID) | ORAL | Status: DC
  Administered 2023-01-29 – 2023-01-30 (×4): 600 mg via ORAL
  Filled 2023-01-28 (×4): qty 1

## 2023-01-28 NOTE — ED Provider Notes (Signed)
[facility] Provider Note    Event Date/Time   First MD Initiated Contact with Patient 01/28/23 1805     (approximate)   History   Dizziness  HPI  Julia Kerr is a 89 y.o. female who presents to the emergency department today accompanied by family because of concerns for dizziness and back pain.  Son states that he is concerned that she might be anemic and required blood transfusions.  He says she has a history of anemia and they think it might be due to decreased production in the bone marrow although they have not directly tested for this.  She did have a transfusion somewhat recently although he said they only gave her 1 unit and normally they give her 2 so he thinks that she might be low again.  Furthermore he states that she had a low-grade fever earlier today.     Physical Exam   Triage Vital Signs: ED Triage Vitals  Enc Vitals Group     BP 01/28/23 1743 (!) 169/67     Pulse Rate 01/28/23 1743 82     Resp 01/28/23 1747 16     Temp 01/28/23 1745 98.6 F (37 C)     Temp Source 01/28/23 1745 Oral     SpO2 01/28/23 1743 100 %     Weight --      Height --      Head Circumference --      Peak Flow --      Pain Score --      Pain Loc --      Pain Edu? --      Excl. in [place]? --     Most recent vital signs: Vitals:   01/28/23 1745 01/28/23 1747  BP:    Pulse:    Resp:  16  Temp: 98.6 F (37 C)   SpO2:     General: Awake, alert. CV:  Good peripheral perfusion. Regular rate and rhythm. Resp:  Normal effort. Lungs clear. Abd:  No distention.     ED Results / Procedures / Treatments   Labs (all labs ordered are listed, but only abnormal results are displayed) Labs Reviewed  CBC WITH DIFFERENTIAL/PLATELET - Abnormal; Notable for the following components:      Result Value   WBC 16.6 (*)    RBC 2.88 (*)    Hemoglobin 8.2 (*)    HCT 26.8 (*)    RDW 18.1 (*)    Platelets 106 (*)    nRBC 0.5 (*)    Neutro Abs 8.4 (*)    Monocytes  Absolute 5.6 (*)    Abs Immature Granulocytes 0.74 (*)    All other components within normal limits  BASIC METABOLIC PANEL - Abnormal; Notable for the following components:   Sodium 128 (*)    CO2 19 (*)    Glucose, Bld 163 (*)    BUN 44 (*)    Creatinine, Ser 1.61 (*)    Calcium 8.1 (*)    GFR, Estimated 29 (*)    All other components within normal limits  CULTURE, BLOOD (ROUTINE X 2)  CULTURE, BLOOD (ROUTINE X 2)  URINALYSIS, ROUTINE W REFLEX MICROSCOPIC  TYPE AND SCREEN     EKG  I, Nance Pear, attending physician, personally viewed and interpreted this EKG  EKG Time: 1735 Rate: 83 Rhythm: sinus rhythm Axis: left axis deviation Intervals: qtc 448 QRS: RBBB ST changes: no st elevation Impression: abnormal ekg   RADIOLOGY I independently  interpreted and visualized the CXR. My interpretation: No pneumonia Radiology interpretation:  IMPRESSION:  Chronic interstitial densities versus atypical infection.     PROCEDURES:  Critical Care performed: No  Procedures   MEDICATIONS ORDERED IN ED: Medications - No data to display   IMPRESSION / MDM / [place] / ED COURSE  I reviewed the triage vital signs and the nursing notes.                              Differential diagnosis includes, but is not limited to, anemia, infection  Patient's presentation is most consistent with acute presentation with potential threat to life or bodily function.  Patient presented to the emergency department today because of concerns for dizziness as well as some back pain.  Workup here is concerning for possible pneumonia.  Chest x-ray with atypical infection and patient has leukocytosis.  Additionally patient son did state that the patient had a low-grade fever earlier today although she is afebrile here.  Blood work shows blood level of 8.2 while certainly anemic at this time I do not feel she requires blood transfusion.  Discussed with Dr. Sidney Ace with hospitalist  service will plan on admission.   FINAL CLINICAL IMPRESSION(S) / ED DIAGNOSES   Final diagnoses:  Pneumonia due to infectious organism, unspecified laterality, unspecified part of lung  Hyponatremia       Note:  This document was prepared using Dragon voice recognition software and may include unintentional dictation errors.    Nance Pear, MD 01/28/23 [phone]

## 2023-01-28 NOTE — ED Notes (Signed)
Please call pt son with any updates or if necessary, [place] Cerise: [phone]

## 2023-01-28 NOTE — ED Notes (Signed)
Pt rpting 10/10 mid to lower back pain, Provider Archie Balboa notified

## 2023-01-29 ENCOUNTER — Encounter: Payer: Self-pay | Admitting: Family Medicine

## 2023-01-29 DIAGNOSIS — F32A Depression, unspecified: Secondary | ICD-10-CM | POA: Insufficient documentation

## 2023-01-29 DIAGNOSIS — D696 Thrombocytopenia, unspecified: Secondary | ICD-10-CM | POA: Insufficient documentation

## 2023-01-29 DIAGNOSIS — L8990 Pressure ulcer of unspecified site, unspecified stage: Secondary | ICD-10-CM | POA: Insufficient documentation

## 2023-01-29 DIAGNOSIS — E871 Hypo-osmolality and hyponatremia: Secondary | ICD-10-CM

## 2023-01-29 DIAGNOSIS — J189 Pneumonia, unspecified organism: Secondary | ICD-10-CM

## 2023-01-29 DIAGNOSIS — N1832 Chronic kidney disease, stage 3b: Secondary | ICD-10-CM

## 2023-01-29 LAB — BASIC METABOLIC PANEL
Anion gap: 11 (ref 5–15)
BUN: 35 mg/dL — ABNORMAL HIGH (ref 8–23)
CO2: 18 mmol/L — ABNORMAL LOW (ref 22–32)
Calcium: 7.6 mg/dL — ABNORMAL LOW (ref 8.9–10.3)
Chloride: 104 mmol/L (ref 98–111)
Creatinine, Ser: 1.47 mg/dL — ABNORMAL HIGH (ref 0.44–1.00)
GFR, Estimated: 32 mL/min — ABNORMAL LOW (ref >60)
Glucose, Bld: 271 mg/dL — ABNORMAL HIGH (ref 70–99)
Potassium: 3.9 mmol/L (ref 3.5–5.1)
Sodium: 133 mmol/L — ABNORMAL LOW (ref 135–145)

## 2023-01-29 LAB — URINALYSIS, ROUTINE W REFLEX MICROSCOPIC
Bilirubin Urine: NEGATIVE
Glucose, UA: NEGATIVE mg/dL
Ketones, ur: NEGATIVE mg/dL
Leukocytes,Ua: NEGATIVE
Nitrite: NEGATIVE
Protein, ur: 30 mg/dL — AB
Specific Gravity, Urine: 1.004 — ABNORMAL LOW (ref 1.005–1.030)
pH: 6 (ref 5.0–8.0)

## 2023-01-29 LAB — CBC
HCT: 28.2 % — ABNORMAL LOW (ref 36.0–46.0)
Hemoglobin: 8.7 g/dL — ABNORMAL LOW (ref 12.0–15.0)
MCH: 28.4 pg (ref 26.0–34.0)
MCHC: 30.9 g/dL (ref 30.0–36.0)
MCV: 92.2 fL (ref 80.0–100.0)
Platelets: 107 10*3/uL — ABNORMAL LOW (ref 150–400)
RBC: 3.06 MIL/uL — ABNORMAL LOW (ref 3.87–5.11)
RDW: 18 % — ABNORMAL HIGH (ref 11.5–15.5)
WBC: 16.8 10*3/uL — ABNORMAL HIGH (ref 4.0–10.5)
nRBC: 0.4 % — ABNORMAL HIGH (ref 0.0–0.2)

## 2023-01-29 LAB — TSH: TSH: 0.707 u[IU]/mL (ref 0.350–4.500)

## 2023-01-29 LAB — OSMOLALITY: Osmolality: 296 mOsm/kg — ABNORMAL HIGH (ref 275–295)

## 2023-01-29 LAB — IRON AND TIBC
Iron: 133 ug/dL (ref 28–170)
Saturation Ratios: 87 % — ABNORMAL HIGH (ref 10.4–31.8)
TIBC: 153 ug/dL — ABNORMAL LOW (ref 250–450)
UIBC: 20 ug/dL

## 2023-01-29 LAB — MAGNESIUM: Magnesium: 1.6 mg/dL — ABNORMAL LOW (ref 1.7–2.4)

## 2023-01-29 LAB — OSMOLALITY, URINE: Osmolality, Ur: 185 mOsm/kg — ABNORMAL LOW (ref 300–900)

## 2023-01-29 LAB — VITAMIN B12: Vitamin B-12: 832 pg/mL (ref 180–914)

## 2023-01-29 LAB — NA AND K (SODIUM & POTASSIUM), RAND UR
Potassium Urine: 14 mmol/L
Sodium, Ur: 41 mmol/L

## 2023-01-29 LAB — FERRITIN: Ferritin: 1649 ng/mL — ABNORMAL HIGH (ref 11–307)

## 2023-01-29 MED ORDER — IPRATROPIUM-ALBUTEROL 0.5-2.5 (3) MG/3ML IN SOLN: 3.0000 mL | RESPIRATORY_TRACT | Status: DC | PRN

## 2023-01-29 MED ORDER — SODIUM CHLORIDE 1 G PO TABS
1.0000 g | ORAL_TABLET | Freq: Two times a day (BID) | ORAL | Status: DC
  Administered 2023-01-29 – 2023-01-30 (×2): 1 g via ORAL
  Filled 2023-01-29 (×2): qty 1

## 2023-01-29 MED ORDER — AZITHROMYCIN 500 MG PO TABS
500.0000 mg | ORAL_TABLET | Freq: Every day | ORAL | Status: DC
  Administered 2023-01-30: 500 mg via ORAL
  Filled 2023-01-29: qty 1

## 2023-01-29 MED ORDER — MAGNESIUM SULFATE 2 GM/50ML IV SOLN
2.0000 g | Freq: Once | INTRAVENOUS | Status: AC
  Administered 2023-01-29: 2 g via INTRAVENOUS
  Filled 2023-01-29: qty 50

## 2023-01-29 MED ORDER — ENOXAPARIN SODIUM 30 MG/0.3ML IJ SOSY
30.0000 mg | PREFILLED_SYRINGE | INTRAMUSCULAR | Status: DC
  Administered 2023-01-29 – 2023-01-30 (×2): 30 mg via SUBCUTANEOUS
  Filled 2023-01-29 (×2): qty 0.3

## 2023-01-29 NOTE — Progress Notes (Signed)
Patient crying in extreme pain. Per interpreter pain in tail bone but she has concerns about taking Tylenol. She states Tylenol doesn't help. Patient only took one 325 mg Tylenol because she wants to ask MD if ok to take( even after speaking to son). On call NP notified defers ordering any other pain medications. Explained to patient it has been 6 hours since last Tylenol. Patient refused morning labs until son comes. Difficulty with communication.

## 2023-01-29 NOTE — Assessment & Plan Note (Addendum)
-   The patient will be admitted to a medical telemetry bed. - Will continue antibiotic therapy with IV Rocephin and Zithromax. - Mucolytic therapy be provided as well as duo nebs q.i.d. and q.4 hours p.r.n. - We will follow blood cultures. - We will follow her leukocytosis.  She is not meeting sepsis criteria.

## 2023-01-29 NOTE — Hospital Course (Signed)
Amorah Telford is a 89 y.o. female with medical history significant for anemia, who presented to the emergency room with low-grade fever with associated low back pain and cramps as well as dizziness and presyncope.  She has been having dyspnea as well as chest pain without significant cough or wheezing.  She has a leukocytosis of 16.6, anemia with hemoglobin 8.2, sodium 128.  Chest x-ray showed evidence of pneumonia. He was given normal saline, he was also started on antibiotics with Rocephin and Zithromax.

## 2023-01-29 NOTE — Assessment & Plan Note (Addendum)
-   Will obtain hyponatremia workup. - I suspect volume depletion and mild dehydration. - She will be hydrated with IV normal saline and will follow BMP.

## 2023-01-29 NOTE — H&P (Addendum)
[place]   PATIENT NAME: Julia Kerr    MR#:  [id number]  DATE OF BIRTH:  [birth year]  DATE OF ADMISSION:  01/28/2023  PRIMARY CARE PHYSICIAN: [name], PA-C   Patient is coming from: Home  REQUESTING/REFERRING PHYSICIAN: Nance Pear, MD  CHIEF COMPLAINT:   Chief Complaint  Patient presents with   Dizziness   Abnormal Lab    Son reports that patient has been feeling dizzy and recently had blood work done that showed a hgb of 7.2(?) last Friday; h/o anemia of chronic kidney failure stage 4    HISTORY OF PRESENT ILLNESS:  Julia Kerr is a 89 y.o. Caucasian female with medical history significant for anemia, who presented to the emergency room with a [place] of anemia and low-grade fever with associated low back pain and cramps as well as dizziness and presyncope.  She has been having dyspnea as well as chest pain without significant cough or wheezing.  No palpitations.  She had 3 watery bowel movements this week.  No nausea or vomiting.  No dysuria, oliguria or hematuria or flank pain.  No bleeding diathesis. The patient is Spanish-speaking only and history was obtained through her son who is translating for her.  ED Course: Upon presentation to the ER, BP was 169/67 with otherwise normal vital signs.  Labs revealed hyponatremia of 128 CO2 of 19 with blood glucose of 163, BUN of 44 and creatinine 1.61, better than previous levels.  CBC showed leukocytosis 16.6 with neutrophilia and anemia better than previous levels with the failure and anemia better than previous levels.  Blood group was A+ with negative antibody screen.  UA was unremarkable.  Blood cultures were drawn. EKG as reviewed by me : Sinus with a rate of 83 with left axis deviation and right bundle branch block with T wave inversion in V1 and V3. Imaging: Portable chest x-ray showed chronic residual densities versus atypical infection.  The patient was given IV Rocephin and Zithromax, 650 mg p.o.  Tylenol and 500 mill IV normal saline bolus.  She will be admitted to a medical telemetry bed for further evaluation and management. PAST MEDICAL HISTORY:   Past Medical History:  Diagnosis Date   Anemia     PAST SURGICAL HISTORY:   Past Surgical History:  Procedure Laterality Date   CATARACT EXTRACTION, BILATERAL     SHOULDER SURGERY      SOCIAL HISTORY:   Social History   Tobacco Use   Smoking status: Never   Smokeless tobacco: Never  Substance Use Topics   Alcohol use: Never    FAMILY HISTORY:  No family history on file.  DRUG ALLERGIES:   Allergies  Allergen Reactions   Eggs-Apples-Oats [Alitraq] Other (See Comments)    Strawberries and Apples cause burning of lips   Food Other (See Comments)    Apples and strawberries cause burning of lips   Strawberry (Diagnostic) Other (See Comments)    Strawberry & apple causes burning of the lips   Strawberry Extract Other (See Comments)    Strawberry & apple allergy----causes burning of the lips    REVIEW OF SYSTEMS:   ROS As per history of present illness. All pertinent systems were reviewed above. Constitutional, HEENT, cardiovascular, respiratory, GI, GU, musculoskeletal, neuro, psychiatric, endocrine, integumentary and hematologic systems were reviewed and are otherwise negative/unremarkable except for positive findings mentioned above in the HPI.   MEDICATIONS AT HOME:   Prior to Admission medications   Medication Sig Start Date  End Date Taking? Authorizing Provider  escitalopram (LEXAPRO) 5 MG tablet Take 5 mg by mouth daily.   Yes [provider]  Multiple Vitamin (MULTIVITAMIN WITH MINERALS) TABS tablet Take 1 tablet by mouth daily. 08/06/22  Yes Annita Brod, MD  fexofenadine (ALLEGRA) 180 MG tablet Take 1 tablet by oral route in the evening for 30 days. Patient not taking: Reported on 01/28/2023 07/12/22   [provider]  Polyethylene Glycol 400 (VISINE DRY EYE RELIEF OP) Place 1 drop  into both eyes as needed (dry eyes).    [provider]      VITAL SIGNS:  Blood pressure (!) 133/53, pulse 76, temperature 97.8 F (36.6 C), resp. rate 20, SpO2 100 %.  PHYSICAL EXAMINATION:  Physical Exam  GENERAL:  89 y.o.-year-old female patient lying in the bed with no acute distress.  EYES: Pupils equal, round, reactive to light and accommodation. No scleral icterus. Extraocular muscles intact.  HEENT: Head atraumatic, normocephalic. Oropharynx and nasopharynx clear.  NECK:  Supple, no jugular venous distention. No thyroid enlargement, no tenderness.  LUNGS: Diminished bibasal breath sounds with bibasal crackles.  No use of accessory muscles of respiration.  CARDIOVASCULAR: Regular rate and rhythm, S1, S2 normal. No murmurs, rubs, or gallops.  ABDOMEN: Soft, nondistended, nontender. Bowel sounds present. No organomegaly or mass.  EXTREMITIES: No pedal edema, cyanosis, or clubbing.  NEUROLOGIC: Cranial nerves II through XII are intact. Muscle strength 5/5 in all extremities. Sensation intact. Gait not checked.  PSYCHIATRIC: The patient is alert and oriented x 3.  Normal affect and good eye contact. SKIN: No obvious rash, lesion, or ulcer.   LABORATORY PANEL:   CBC Recent Labs  Lab 01/28/23 1750  WBC 16.6*  HGB 8.2*  HCT 26.8*  PLT 106*   ------------------------------------------------------------------------------------------------------------------  Chemistries  Recent Labs  Lab 01/28/23 1750  NA 128*  K 4.4  CL 98  CO2 19*  GLUCOSE 163*  BUN 44*  CREATININE 1.61*  CALCIUM 8.1*   ------------------------------------------------------------------------------------------------------------------  Cardiac Enzymes No results for input(s): "TROPONINI" in the last 168 hours. ------------------------------------------------------------------------------------------------------------------  RADIOLOGY:  DG Chest Portable 1 View  Result Date:  01/28/2023 CLINICAL DATA:  Chest pain. EXAM: PORTABLE CHEST 1 VIEW COMPARISON:  Chest radiograph dated 08/03/2022. FINDINGS: Mild diffuse interstitial densities may be chronic. Atypical infection is not excluded clinical correlation is recommended. No focal consolidation, pleural effusion, or pneumothorax. Stable cardiac silhouette atherosclerotic calcification of the aorta. Osteopenia with degenerative changes of the spine. Right humeral fixation hardware. No acute osseous pathology. IMPRESSION: Chronic interstitial densities versus atypical infection. Electronically Signed   By: Anner Crete M.D.   On: 01/28/2023 21:09      IMPRESSION AND PLAN:  Assessment and Plan: * CAP (community acquired pneumonia) - The patient will be admitted to a medical telemetry bed. - Will continue antibiotic therapy with IV Rocephin and Zithromax. - Mucolytic therapy be provided as well as duo nebs q.i.d. and q.4 hours p.r.n. - We will follow blood cultures. - We will follow her leukocytosis.  She is not meeting sepsis criteria.   Hyponatremia - Will obtain hyponatremia workup. - I suspect volume depletion and mild dehydration. - She will be hydrated with IV normal saline and will follow BMP.  Stage 3b chronic kidney disease (CKD) ([place]) - Renal functions are currently close to previous levels. - We will continue hydration as mentioned above and follow BMP.  Depression - We will continue Lexapro.  I doubt that this is contributing to her hyponatremia.  DVT prophylaxis: Lovenox.  Advanced Care Planning:  Code Status: full code.  Family Communication:  The plan of care was discussed in details with the patient (and family). I answered all questions. The patient agreed to proceed with the above mentioned plan. Further management will depend upon hospital course. Disposition Plan: Back to previous home environment Consults called: none.  All the records are reviewed and case discussed with ED  provider.  Status is: Inpatient   At the time of the admission, it appears that the appropriate admission status for this patient is inpatient.  This is judged to be reasonable and necessary in order to provide the required intensity of service to ensure the patient's safety given the presenting symptoms, physical exam findings and initial radiographic and laboratory data in the context of comorbid conditions.  The patient requires inpatient status due to high intensity of service, high risk of further deterioration and high frequency of surveillance required.  I certify that at the time of admission, it is my clinical judgment that the patient will require inpatient hospital care extending more than 2 midnights.                            Dispo: The patient is from: Home              Anticipated d/c is to: Home              Patient currently is not medically stable to d/c.              Difficult to place patient: No  Christel Mormon M.D on 01/29/2023 at 5:13 AM  [facility]   From 7 PM-7 AM, contact night-coverage [email or web address]  CC: Primary care physician; [name], PA-C

## 2023-01-29 NOTE — Assessment & Plan Note (Signed)
-   We will continue Lexapro.  I doubt that this is contributing to her hyponatremia.

## 2023-01-29 NOTE — Progress Notes (Signed)
PHARMACIST - PHYSICIAN COMMUNICATION  CONCERNING:  Enoxaparin (Lovenox) for DVT Prophylaxis    RECOMMENDATION: Patient was prescribed enoxaprin 47m q24 hours for VTE prophylaxis.   There were no vitals filed for this visit.  There is no height or weight on file to calculate BMI.  CrCl cannot be calculated (Unknown ideal weight.).  Patient is candidate for enoxaparin 333mevery 24 hours based on CrCl <3055min or Weight <45kg  DESCRIPTION: [facility]  Patient is now receiving enoxaparin 30 mg every 24 hours   NatRenda RollsharmD, MBACommunity Hospital Of Bremen Inc10/2024 5:36 AM

## 2023-01-29 NOTE — Assessment & Plan Note (Signed)
-   Renal functions are currently close to previous levels. - We will continue hydration as mentioned above and follow BMP.

## 2023-01-29 NOTE — Progress Notes (Signed)
  Progress Note   Patient: Julia Kerr [id number] DOB: Mar 27, 1923 DOA: 01/28/2023     1 DOS: the patient was seen and examined on 01/29/2023   Brief hospital course: Julia Kerr is a 89 y.o. female with medical history significant for anemia, who presented to the emergency room with low-grade fever with associated low back pain and cramps as well as dizziness and presyncope.  She has been having dyspnea as well as chest pain without significant cough or wheezing.  She has a leukocytosis of 16.6, anemia with hemoglobin 8.2, sodium 128.  Chest x-ray showed evidence of pneumonia. He was given normal saline, he was also started on antibiotics with Rocephin and Zithromax.   Principal Problem:   CAP (community acquired pneumonia) Active Problems:   Symptomatic anemia in the setting of in the from stage IIIb/IV chronic kidney disease   Hyponatremia   Stage 3b chronic kidney disease (CKD) (HCC)   Depression   Pressure injury of skin   Thrombocytopenia (HCC)   Hypomagnesemia   Assessment and Plan: * CAP (community acquired pneumonia) Patient had a recent diarrhea, follow with respiratory symptoms.  Most likely viral pneumonia, will check procalcitonin level. Continue Rocephin and Zithromax.  Hyponatremia Hypomagnesemia. Stage 3b chronic kidney disease (CKD) ([place]) .  Secondary to his diarrhea.  Sodium level better, discontinue fluids, added salt tablets, recheck lab tomorrow. Replete magnesium. Renal function stable.  Depression - We will continue Lexapro.  I doubt that this is contributing to her hyponatremia.  CODE STATUS. Patient currently is a full code, discussed CODE STATUS with son, recommended to not resuscitate.  He will talk with the family members and make decision tomorrow.     Subjective:  Patient doing well, some short of breath without a cough.  Physical Exam: Vitals:   01/28/23 2230 01/29/23 0027 01/29/23 0732 01/29/23 0743  BP:  (!) 133/53 (!) 144/59    Pulse: 78 76 79   Resp:  20 16   Temp:  97.8 F (36.6 C) 98.3 F (36.8 C)   TempSrc:      SpO2: 99% 100% 100% 100%   General exam: Appears calm and comfortable  Respiratory system: Clear to auscultation. Respiratory effort normal. Cardiovascular system: S1 & S2 heard, RRR. No JVD, murmurs, rubs, gallops or clicks. No pedal edema. Gastrointestinal system: Abdomen is nondistended, soft and nontender. No organomegaly or masses felt. Normal bowel sounds heard. Central nervous system: Alert and oriented x2. No focal neurological deficits. Extremities: Symmetric 5 x 5 power. Skin: No rashes, lesions or ulcers Psychiatry: Mood & affect appropriate.    Data Reviewed:  X-ray and lab results reviewed.  Family Communication: Son updated  Disposition: Status is: Inpatient Remains inpatient appropriate because: Guarded disease, antibiotics.     Time spent: no charge minutes  Author: Sharen Hones, MD 01/29/2023 12:32 PM  For on call review [email or web address].

## 2023-01-30 LAB — CBC
HCT: 26 % — ABNORMAL LOW (ref 36.0–46.0)
Hemoglobin: 8.1 g/dL — ABNORMAL LOW (ref 12.0–15.0)
MCH: 28.5 pg (ref 26.0–34.0)
MCHC: 31.2 g/dL (ref 30.0–36.0)
MCV: 91.5 fL (ref 80.0–100.0)
Platelets: 134 10*3/uL — ABNORMAL LOW (ref 150–400)
RBC: 2.84 MIL/uL — ABNORMAL LOW (ref 3.87–5.11)
RDW: 18 % — ABNORMAL HIGH (ref 11.5–15.5)
WBC: 16.4 10*3/uL — ABNORMAL HIGH (ref 4.0–10.5)
nRBC: 0.9 % — ABNORMAL HIGH (ref 0.0–0.2)

## 2023-01-30 LAB — BASIC METABOLIC PANEL
Anion gap: 10 (ref 5–15)
BUN: 32 mg/dL — ABNORMAL HIGH (ref 8–23)
CO2: 19 mmol/L — ABNORMAL LOW (ref 22–32)
Calcium: 8.4 mg/dL — ABNORMAL LOW (ref 8.9–10.3)
Chloride: 104 mmol/L (ref 98–111)
Creatinine, Ser: 1.56 mg/dL — ABNORMAL HIGH (ref 0.44–1.00)
GFR, Estimated: 30 mL/min — ABNORMAL LOW (ref >60)
Glucose, Bld: 91 mg/dL (ref 70–99)
Potassium: 4.4 mmol/L (ref 3.5–5.1)
Sodium: 133 mmol/L — ABNORMAL LOW (ref 135–145)

## 2023-01-30 LAB — MAGNESIUM: Magnesium: 1.9 mg/dL (ref 1.7–2.4)

## 2023-01-30 MED ORDER — AZITHROMYCIN 500 MG PO TABS: 500.0000 mg | ORAL_TABLET | Freq: Every day | ORAL | 0 refills | Status: AC

## 2023-01-30 MED ORDER — CEFDINIR 300 MG PO CAPS: 300.0000 mg | ORAL_CAPSULE | Freq: Two times a day (BID) | ORAL | 0 refills | Status: AC

## 2023-01-30 MED ORDER — SODIUM CHLORIDE 1 G PO TABS: 1.0000 g | ORAL_TABLET | Freq: Two times a day (BID) | ORAL | 0 refills | Status: AC

## 2023-01-30 MED ORDER — SODIUM BICARBONATE 650 MG PO TABS: 650.0000 mg | ORAL_TABLET | Freq: Two times a day (BID) | ORAL | 0 refills | Status: AC

## 2023-01-30 NOTE — TOC Initial Note (Signed)
Transition of Care [facility]) - Initial/Assessment Note    Patient Details  Name: Julia Kerr MRN: [id number] Date of Birth: September 28, 1923  [facility]) CM/SW Contact:    Valente David, RN Phone Number: 01/30/2023, 12:02 PM  Clinical Narrative:                  Spoke with son Roselyn Reef, patient admitted from home where she lives with him.  He will provide transportation home once she is ready to go.  Primary care provided by [facility].  Uses walker and wheelchair at home.  Son is notified of plan to discharge today, recommendations for HHTP/OT.  Call was placed to [facility], left message with the triage line regarding patient needs, awaiting call back to confirm services will be provided.  Son aware of plan and agrees.   Expected Discharge Plan: [place] Barriers to Discharge: Continued Medical Work up   Patient Goals and CMS Choice Patient states their goals for this hospitalization and ongoing recovery are:: Home with home health CMS Medicare.gov Compare Post Acute Care list provided to:: Patient Represenative (must comment) (son, Roselyn Reef)        Expected Discharge Plan and Services       Living arrangements for the past 2 months: Single Family Home Expected Discharge Date: 01/30/23                                    Prior Living Arrangements/Services Living arrangements for the past 2 months: Single Family Home Lives with:: Adult Children   Do you feel safe going back to the place where you live?: Yes      Need for Family Participation in Patient Care: Yes (Comment) Care giver support system in place?: Yes (comment) Current home services: DME Criminal Activity/Legal Involvement Pertinent to Current Situation/Hospitalization: No - Comment as needed  Activities of Daily Living Home Assistive Devices/Equipment: Walker (specify type) ADL Screening (condition at time of admission) Patient's cognitive ability adequate to safely complete daily  activities?: Yes Is the patient deaf or have difficulty hearing?: Yes Does the patient have difficulty seeing, even when wearing glasses/contacts?: Yes Does the patient have difficulty concentrating, remembering, or making decisions?: No Patient able to express need for assistance with ADLs?: Yes Does the patient have difficulty dressing or bathing?: Yes Independently performs ADLs?: Yes (appropriate for developmental age) Does the patient have difficulty walking or climbing stairs?: Yes Weakness of Legs: Both Weakness of Arms/Hands: Both  Permission Sought/Granted   Permission granted to share information with : Yes, Verbal Permission Granted              Emotional Assessment              Admission diagnosis:  Hyponatremia [E87.1] CAP (community acquired pneumonia) [J18.9] Pneumonia due to infectious organism, unspecified laterality, unspecified part of lung [J18.9] Patient Active Problem List   Diagnosis Date Noted   Hyponatremia 01/29/2023   Depression 01/29/2023   Pressure injury of skin 01/29/2023   Thrombocytopenia ([place]) 01/29/2023   Hypomagnesemia 01/29/2023   CAP (community acquired pneumonia) 01/28/2023   Protein-calorie malnutrition, severe 08/04/2022   Anemia of chronic kidney failure, stage 4 (severe) ([place]) 08/03/2022   AKI (acute kidney injury) ([place]) in the setting of stage IIIb/IV chronic kidney disease 08/03/2022   Axillary lymphadenopathy 03/19/2022   History of pelvic fracture 12/2021 03/18/2022   Fracture of superior pubic  ramus ([place]) 02/15/2022   Multiple closed pelvic fractures without disruption of pelvic ring ([place]) 01/18/2022   Multiple closed pelvic fractures without disruption of pelvic circle ([place]) 01/17/2022   Anemia of chronic renal failure, stage 3b ([place]) 01/17/2022   Seasonal allergic rhinitis 04/12/2021   Left sided sciatica 04/12/2021   Chronic rhinitis 02/06/2020   Adverse food reaction 02/06/2020   Vaccine counseling 02/06/2020    Cellulitis of lower extremity 08/18/2019   Insect bite 08/01/2019   Low serum thyroid stimulating hormone (TSH) 08/17/2018   Symptomatic anemia in the setting of in the from stage IIIb/IV chronic kidney disease 08/17/2018   Stage 3b chronic kidney disease (CKD) ([place]) 08/17/2018   Dehydration, moderate 08/17/2018   Low HDL (under 40) 08/17/2018   Vitamin D deficiency 08/04/2018   B12 deficiency-requiring injections in past 08/04/2018   Bruit of right carotid artery 08/04/2018   Choledocholithiasis with chronic cholecystitis- not amenable to surgery due to age [id number]   Biliary colic symptom- intolerant to dairy and fats due to gallstone presence 08/04/2018   Environmental and seasonal allergies 08/04/2018   Underweight 08/04/2018   Postmenopausal bone loss 08/04/2018   Chronic fatigue- with B12 def 08/04/2018   PCP:  [name], PA-C Pharmacy:   [id number]: [phone]: [phone]    Social Determinants of Health (SDOH) Social History: SDOH Screenings   Food Insecurity: No Food Insecurity (01/29/2023)  Housing: Low Risk  (01/29/2023)  Transportation Needs: No Transportation Needs (01/29/2023)  Utilities: Not At Risk (01/29/2023)  Depression (PHQ2-9): Low Risk  (04/12/2022)  Tobacco Use: Low Risk  (01/29/2023)   SDOH Interventions:     Readmission Risk Interventions     No data to display

## 2023-01-30 NOTE — TOC Transition Note (Signed)
Transition of Care [facility]) - CM/SW Discharge Note   Patient Details  Name: Julia Kerr MRN: [id number] Date of Birth: 1923/12/02  [facility] CM/SW Contact:  Valente David, RN Phone Number: 01/30/2023, 3:31 PM   Clinical Narrative:     Patient was discharged home with son.  Received call back from [facility], spoke with on call provider and advised of discharge, including new medication orders and orders for HHPT/OT.  [facility] will arrange for medication management and therapy sessions.      Barriers to Discharge: Continued Medical Work up   Patient Goals and CMS Choice CMS Medicare.gov Compare Post Acute Care list provided to:: Patient Represenative (must comment) (son, Roselyn Reef)    Discharge Placement                         Discharge Plan and Services Additional resources added to the After Visit Summary for                                       Social Determinants of Health (SDOH) Interventions SDOH Screenings   Food Insecurity: No Food Insecurity (01/29/2023)  Housing: Low Risk  (01/29/2023)  Transportation Needs: No Transportation Needs (01/29/2023)  Utilities: Not At Risk (01/29/2023)  Depression (PHQ2-9): Low Risk  (04/12/2022)  Tobacco Use: Low Risk  (01/29/2023)     Readmission Risk Interventions     No data to display

## 2023-01-30 NOTE — Plan of Care (Signed)
  Problem: Activity: Goal: Ability to tolerate increased activity will improve Outcome: Progressing   Problem: Clinical Measurements: Goal: Ability to maintain a body temperature in the normal range will improve Outcome: Progressing   Problem: Respiratory: Goal: Ability to maintain adequate ventilation will improve Outcome: Progressing Goal: Ability to maintain a clear airway will improve Outcome: Progressing   Problem: Health Behavior/Discharge Planning: Goal: Ability to manage health-related needs will improve Outcome: Progressing   Problem: Clinical Measurements: Goal: Ability to maintain clinical measurements within normal limits will improve Outcome: Progressing Goal: Will remain free from infection Outcome: Progressing Goal: Diagnostic test results will improve Outcome: Progressing Goal: Respiratory complications will improve Outcome: Progressing Goal: Cardiovascular complication will be avoided Outcome: Progressing   Problem: Nutrition: Goal: Adequate nutrition will be maintained Outcome: Progressing   Problem: Coping: Goal: Level of anxiety will decrease Outcome: Progressing   Problem: Elimination: Goal: Will not experience complications related to bowel motility Outcome: Progressing Goal: Will not experience complications related to urinary retention Outcome: Progressing   Problem: Pain Managment: Goal: General experience of comfort will improve Outcome: Progressing   Problem: Safety: Goal: Ability to remain free from injury will improve Outcome: Progressing   Problem: Skin Integrity: Goal: Risk for impaired skin integrity will decrease Outcome: Progressing

## 2023-01-30 NOTE — Progress Notes (Signed)
Spanish speaking lab tech called to inform the nurse that patient refused labs.  Lab tech reported that she has educated the patient.  Nurse presents to bedside and initiated interpretation interaction and educated the patient on the need for labs, patient still declined to get labs.

## 2023-01-30 NOTE — Discharge Summary (Addendum)
Physician Discharge Summary   Patient: Julia Kerr MRN: [id number] DOB: February 08, 1923  Admit date:     01/28/2023  Discharge date: 01/30/23  Discharge Physician: Sharen Hones   PCP: [name], PA-C   Recommendations at discharge:   Follow-up with PCP in 1 week.  Discharge Diagnoses: Principal Problem:   CAP (community acquired pneumonia) Active Problems:   Symptomatic anemia in the setting of in the from stage IIIb/IV chronic kidney disease   Hyponatremia   Stage 3b chronic kidney disease (CKD) (HCC)   Depression   Pressure injury of skin   Thrombocytopenia (HCC)   Hypomagnesemia Metabolic acidosis. Resolved Problems:   * No resolved hospital problems. *  Hospital Course: Julia Kerr is a 89 y.o. female with medical history significant for anemia, who presented to the emergency room with low-grade fever with associated low back pain and cramps as well as dizziness and presyncope.  She has been having dyspnea as well as chest pain without significant cough or wheezing.  She has a leukocytosis of 16.6, anemia with hemoglobin 8.2, sodium 128.  Chest x-ray showed evidence of pneumonia. He was given normal saline, he was also started on antibiotics with Rocephin and Zithromax.  Assessment and Plan: CAP (community acquired pneumonia) Patient had a recent diarrhea, follow with respiratory symptoms.  Most likely viral pneumonia.  Patient never developed hypoxemia.  Continue finish 5 days of oral antibiotics.   Hyponatremia Hypomagnesemia. Stage 3b chronic kidney disease (CKD) (HCC) Metabolic acidosis. Function is stable.  Sodium level is better, continue salt tablets for 7 days. Magnesium level normalized. Also prescribed sodium bicarbonate 650 mg twice a day.  Depression Lexapro.  Anemia of chronic kidney disease.  thrombocytopenia.  Looking at her prior lab, patient has a chronic anemia, hemoglobin still stable.  No iron or B12 deficiency. Thrombocytopenia is  better.  Pressure ulcers POA Pressure Injury 01/29/23 Vertebral column Lower Stage 1 -  Intact skin with non-blanchable redness of a localized area usually over a bony prominence. Redness, blancable (Active)  01/29/23 0010  Location: Vertebral column  Location Orientation: Lower  Staging: Stage 1 -  Intact skin with non-blanchable redness of a localized area usually over a bony prominence.  Wound Description (Comments): Redness, blancable  Present on Admission: Yes     Pressure Injury 01/29/23 Heel Right Stage 1 -  Intact skin with non-blanchable redness of a localized area usually over a bony prominence. scab redness (Active)  01/29/23 0015  Location: Heel  Location Orientation: Right  Staging: Stage 1 -  Intact skin with non-blanchable redness of a localized area usually over a bony prominence.  Wound Description (Comments): scab redness  Present on Admission: Yes    CODE STATUS. Discussed with the patient and family, patient CODE STATUS changed to DO NOT RESUSCITATE status.       Consultants: None Procedures performed: None  Disposition: Home health Diet recommendation:  Discharge Diet Orders (From admission, onward)     Start     Ordered   01/30/23 0000  Diet - low sodium heart healthy        01/30/23 1108           Cardiac diet DISCHARGE MEDICATION: Allergies as of 01/30/2023       Reactions   Eggs-apples-oats [alitraq] Other (See Comments)   Strawberries and Apples cause burning of lips   Food Other (See Comments)   Apples and strawberries cause burning of lips   Strawberry (diagnostic) Other (See Comments)   Strawberry &  apple causes burning of the lips   Strawberry Extract Other (See Comments)   Strawberry & apple allergy----causes burning of the lips        Medication List     STOP taking these medications    fexofenadine 180 MG tablet Commonly known as: ALLEGRA       TAKE these medications    azithromycin 500 MG tablet Commonly known  as: ZITHROMAX Take 1 tablet (500 mg total) by mouth daily for 2 days. Start taking on: January 31, 2023   cefdinir 300 MG capsule Commonly known as: OMNICEF Take 1 capsule (300 mg total) by mouth 2 (two) times daily for 3 days.   escitalopram 5 MG tablet Commonly known as: LEXAPRO Take 5 mg by mouth daily.   multivitamin with minerals Tabs tablet Take 1 tablet by mouth daily.   sodium chloride 1 g tablet Take 1 tablet (1 g total) by mouth 2 (two) times daily with a meal for 7 days.   VISINE DRY EYE RELIEF OP Place 1 drop into both eyes as needed (dry eyes).               Discharge Care Instructions  (From admission, onward)           Start     Ordered   01/30/23 0000  Discharge wound care:       Comments: Follow with pcp   01/30/23 1108            Follow-up Information     Abonza, Herb Grays, PA-C Follow up in 1 week(s).   Specialty: Physician Assistant Contact information: [place] [address] [phone]                Discharge Exam: There were no vitals filed for this visit. General exam: Appears calm and comfortable  Respiratory system: Clear to auscultation. Respiratory effort normal. Cardiovascular system: S1 & S2 heard, RRR. No JVD, murmurs, rubs, gallops or clicks. No pedal edema. Gastrointestinal system: Abdomen is nondistended, soft and nontender. No organomegaly or masses felt. Normal bowel sounds heard. Central nervous system: Alert and oriented x2. No focal neurological deficits. Extremities: Symmetric 5 x 5 power. Skin: No rashes, lesions or ulcers Psychiatry: Judgement and insight appear normal. Mood & affect appropriate.    Condition at discharge: good  The results of significant diagnostics from this hospitalization (including imaging, microbiology, ancillary and laboratory) are listed below for reference.   Imaging Studies: DG Chest Portable 1 View  Result Date: 01/28/2023 CLINICAL DATA:   Chest pain. EXAM: PORTABLE CHEST 1 VIEW COMPARISON:  Chest radiograph dated 08/03/2022. FINDINGS: Mild diffuse interstitial densities may be chronic. Atypical infection is not excluded clinical correlation is recommended. No focal consolidation, pleural effusion, or pneumothorax. Stable cardiac silhouette atherosclerotic calcification of the aorta. Osteopenia with degenerative changes of the spine. Right humeral fixation hardware. No acute osseous pathology. IMPRESSION: Chronic interstitial densities versus atypical infection. Electronically Signed   By: Anner Crete M.D.   On: 01/28/2023 21:09    Microbiology: Results for orders placed or performed during the hospital encounter of 01/28/23  Blood culture (routine x 2)     Status: None (Preliminary result)   Collection Time: 01/28/23 11:00 PM   Specimen: BLOOD LEFT ARM  Result Value Ref Range Status   Specimen Description BLOOD LEFT ARM  Final   Special Requests   Final    BOTTLES DRAWN AEROBIC AND ANAEROBIC Blood Culture adequate volume   Culture  Final    NO GROWTH 2 DAYS Performed at [facility], [address] [place], [place] [address]    Report Status PENDING  Incomplete  Blood culture (routine x 2)     Status: None (Preliminary result)   Collection Time: 01/28/23 11:00 PM   Specimen: BLOOD RIGHT ARM  Result Value Ref Range Status   Specimen Description BLOOD RIGHT ARM  Final   Special Requests   Final    BOTTLES DRAWN AEROBIC AND ANAEROBIC Blood Culture results may not be optimal due to an inadequate volume of blood received in culture bottles   Culture   Final    NO GROWTH 2 DAYS Performed at [facility], [address] [place], [place] [address]    Report Status PENDING  Incomplete    Labs: CBC: Recent Labs  Lab 01/28/23 1750 01/29/23 1010 01/30/23 0913  WBC 16.6* 16.8* 16.4*  NEUTROABS 8.4*  --   --   HGB 8.2* 8.7* 8.1*  HCT 26.8* 28.2* 26.0*  MCV 93.1 92.2 91.5  PLT 106* 107* 134*    Basic Metabolic Panel: Recent Labs  Lab 01/28/23 1750 01/29/23 1010 01/30/23 0913  NA 128* 133* 133*  K 4.4 3.9 4.4  CL 98 104 104  CO2 19* 18* 19*  GLUCOSE 163* 271* 91  BUN 44* 35* 32*  CREATININE 1.61* 1.47* 1.56*  CALCIUM 8.1* 7.6* 8.4*  MG  --  1.6* 1.9   Liver Function Tests: No results for input(s): "AST", "ALT", "ALKPHOS", "BILITOT", "PROT", "ALBUMIN" in the last 168 hours. CBG: No results for input(s): "GLUCAP" in the last 168 hours.  Discharge time spent: greater than 30 minutes.  Signed: Sharen Hones, MD [facility] 01/30/2023

## 2023-01-31 ENCOUNTER — Telehealth: Payer: Self-pay

## 2023-01-31 NOTE — Transitions of Care (Post Inpatient/ED Visit) (Signed)
   01/31/2023  Name: Julia Kerr MRN: [id number] DOB: [birth year]  Today's TOC FU Call Status: Today's TOC FU Call Status:: Successful TOC FU Call Competed TOC FU Call Complete Date: 01/31/23  Attempted to reach the patient regarding the most recent Inpatient/ED visit.  Follow Up Plan: No further outreach attempts will be made at this time. We have been unable to contact the patient.  Signature Juanda Crumble, [place] Direct Dial [phone]

## 2023-02-01 ENCOUNTER — Telehealth: Payer: Self-pay | Admitting: *Deleted

## 2023-02-01 NOTE — Telephone Encounter (Signed)
Clemens Catholic, NP with [facility] would like to talk with you abut Ms [facility] and her continued elevated WBC and anemia. Ph: [phone]

## 2023-02-02 LAB — CULTURE, BLOOD (ROUTINE X 2)
Culture: NO GROWTH
Culture: NO GROWTH
Special Requests: ADEQUATE

## 2023-02-09 ENCOUNTER — Other Ambulatory Visit (HOSPITAL_COMMUNITY): Payer: Self-pay | Admitting: *Deleted

## 2023-02-09 DIAGNOSIS — D649 Anemia, unspecified: Secondary | ICD-10-CM

## 2023-02-10 ENCOUNTER — Ambulatory Visit (HOSPITAL_COMMUNITY)
Admission: RE | Admit: 2023-02-10 | Discharge: 2023-02-10 | Disposition: A | Discharge status: Home / Self Care | Payer: Medicare (Managed Care) | Source: Ambulatory Visit | Attending: Hematology and Oncology | Admitting: Hematology and Oncology

## 2023-02-10 VITALS — BP 164/60 | HR 97 | Temp 98.1°F | Resp 16

## 2023-02-10 DIAGNOSIS — D649 Anemia, unspecified: Secondary | ICD-10-CM | POA: Insufficient documentation

## 2023-02-10 DIAGNOSIS — N184 Chronic kidney disease, stage 4 (severe): Secondary | ICD-10-CM | POA: Insufficient documentation

## 2023-02-10 DIAGNOSIS — D631 Anemia in chronic kidney disease: Secondary | ICD-10-CM | POA: Diagnosis present

## 2023-02-10 LAB — CBC WITH DIFFERENTIAL/PLATELET
Abs Immature Granulocytes: 1.31 10*3/uL — ABNORMAL HIGH (ref 0.00–0.07)
Abs Immature Granulocytes: 1.24 10*3/uL — ABNORMAL HIGH (ref 0.00–0.07)
Basophils Absolute: 0 10*3/uL (ref 0.0–0.1)
Basophils Absolute: 0 10*3/uL (ref 0.0–0.1)
Basophils Relative: 0 %
Basophils Relative: 0 %
Eosinophils Absolute: 0 10*3/uL (ref 0.0–0.5)
Eosinophils Absolute: 0 10*3/uL (ref 0.0–0.5)
Eosinophils Relative: 0 %
Eosinophils Relative: 0 %
HCT: 26.5 % — ABNORMAL LOW (ref 36.0–46.0)
HCT: 31 % — ABNORMAL LOW (ref 36.0–46.0)
Hemoglobin: 7.8 g/dL — ABNORMAL LOW (ref 12.0–15.0)
Hemoglobin: 10.4 g/dL — ABNORMAL LOW (ref 12.0–15.0)
Immature Granulocytes: 8 %
Immature Granulocytes: 6 %
Lymphocytes Relative: 12 %
Lymphocytes Relative: 12 %
Lymphs Abs: 2 10*3/uL (ref 0.7–4.0)
Lymphs Abs: 2.3 10*3/uL (ref 0.7–4.0)
MCH: 28.9 pg (ref 26.0–34.0)
MCH: 30.5 pg (ref 26.0–34.0)
MCHC: 29.4 g/dL — ABNORMAL LOW (ref 30.0–36.0)
MCHC: 33.5 g/dL (ref 30.0–36.0)
MCV: 98.1 fL (ref 80.0–100.0)
MCV: 90.9 fL (ref 80.0–100.0)
Monocytes Absolute: 4.6 10*3/uL — ABNORMAL HIGH (ref 0.1–1.0)
Monocytes Absolute: 5 10*3/uL — ABNORMAL HIGH (ref 0.1–1.0)
Monocytes Relative: 27 %
Monocytes Relative: 26 %
Neutro Abs: 8.8 10*3/uL — ABNORMAL HIGH (ref 1.7–7.7)
Neutro Abs: 10.6 10*3/uL — ABNORMAL HIGH (ref 1.7–7.7)
Neutrophils Relative %: 53 %
Neutrophils Relative %: 56 %
Platelets: 146 10*3/uL — ABNORMAL LOW (ref 150–400)
Platelets: 156 10*3/uL (ref 150–400)
RBC: 2.7 MIL/uL — ABNORMAL LOW (ref 3.87–5.11)
RBC: 3.41 MIL/uL — ABNORMAL LOW (ref 3.87–5.11)
RDW: 18.7 % — ABNORMAL HIGH (ref 11.5–15.5)
RDW: 17.3 % — ABNORMAL HIGH (ref 11.5–15.5)
WBC: 16.7 10*3/uL — ABNORMAL HIGH (ref 4.0–10.5)
WBC: 19.3 10*3/uL — ABNORMAL HIGH (ref 4.0–10.5)
nRBC: 0.4 % — ABNORMAL HIGH (ref 0.0–0.2)
nRBC: 0.3 % — ABNORMAL HIGH (ref 0.0–0.2)

## 2023-02-10 LAB — PREPARE RBC (CROSSMATCH)

## 2023-02-10 LAB — PATHOLOGIST SMEAR REVIEW

## 2023-02-10 MED ORDER — SODIUM CHLORIDE 0.9% IV SOLUTION: Freq: Once | INTRAVENOUS | Status: DC

## 2023-02-11 ENCOUNTER — Ambulatory Visit
Admission: RE | Admit: 2023-02-11 | Discharge: 2023-02-11 | Disposition: A | Discharge status: Home / Self Care | Payer: No Typology Code available for payment source | Source: Ambulatory Visit | Attending: Vascular Surgery | Admitting: Vascular Surgery

## 2023-02-11 ENCOUNTER — Other Ambulatory Visit: Payer: Self-pay | Admitting: Vascular Surgery

## 2023-02-11 ENCOUNTER — Encounter (HOSPITAL_COMMUNITY): Payer: Self-pay

## 2023-02-11 DIAGNOSIS — M810 Age-related osteoporosis without current pathological fracture: Secondary | ICD-10-CM

## 2023-02-11 DIAGNOSIS — M25551 Pain in right hip: Secondary | ICD-10-CM

## 2023-02-11 LAB — BPAM RBC
Blood Product Expiration Date: 202403062359
Blood Product Expiration Date: 202403122359
ISSUE DATE / TIME: 202402221005
ISSUE DATE / TIME: 202402221337
Unit Type and Rh: 6200
Unit Type and Rh: 6200

## 2023-02-11 LAB — TYPE AND SCREEN
ABO/RH(D): A POS
Antibody Screen: NEGATIVE
Unit division: 0
Unit division: 0

## 2023-02-14 ENCOUNTER — Other Ambulatory Visit: Payer: Self-pay | Admitting: Physician Assistant

## 2023-02-14 ENCOUNTER — Other Ambulatory Visit: Payer: Self-pay

## 2023-02-14 DIAGNOSIS — D638 Anemia in other chronic diseases classified elsewhere: Secondary | ICD-10-CM

## 2023-02-15 ENCOUNTER — Inpatient Hospital Stay: Payer: Medicare (Managed Care)

## 2023-02-15 ENCOUNTER — Other Ambulatory Visit: Payer: Self-pay | Admitting: Hematology and Oncology

## 2023-02-15 ENCOUNTER — Inpatient Hospital Stay: Payer: Medicare (Managed Care) | Attending: Physician Assistant | Admitting: Physician Assistant

## 2023-02-17 ENCOUNTER — Encounter: Payer: Self-pay | Admitting: Hematology and Oncology

## 2023-03-02 ENCOUNTER — Telehealth: Payer: Self-pay | Admitting: *Deleted

## 2023-03-02 NOTE — Telephone Encounter (Signed)
PC to Clemens Catholic NP with [facility], informed her Dr Libby Maw message as below.  She verbalizes understanding.

## 2023-03-02 NOTE — Telephone Encounter (Signed)
-----   Message from Orson Slick, MD sent at 03/02/2023  2:14 PM EDT ----- No antibiotic prophylaxis required due to her blood counts.   ----- Message ----- From: Rolene Course, RN Sent: 03/02/2023  12:32 PM EDT To: Orson Slick, MD  A NP from [facility] called about this patient.  She has an upcoming dental appointment & they are asking if she needs an antibiotic prior because of her hx of elevated WBC.  The NP wasn't sure exactly what procedure be done, could be just a cleaning, or possibly a filling or extraction.  Please advise.  Thanks, Bethena Roys

## 2023-03-16 ENCOUNTER — Emergency Department: Payer: Medicare (Managed Care)

## 2023-03-16 ENCOUNTER — Other Ambulatory Visit: Payer: Self-pay

## 2023-03-16 ENCOUNTER — Inpatient Hospital Stay
Admission: EM | Admit: 2023-03-16 | Discharge: 2023-03-18 | DRG: 444 | Disposition: A | Discharge status: Home / Self Care | Payer: Medicare (Managed Care) | Attending: Internal Medicine | Admitting: Internal Medicine

## 2023-03-16 DIAGNOSIS — R1013 Epigastric pain: Secondary | ICD-10-CM | POA: Diagnosis not present

## 2023-03-16 DIAGNOSIS — N184 Chronic kidney disease, stage 4 (severe): Secondary | ICD-10-CM | POA: Diagnosis present

## 2023-03-16 DIAGNOSIS — E43 Unspecified severe protein-calorie malnutrition: Secondary | ICD-10-CM | POA: Diagnosis present

## 2023-03-16 DIAGNOSIS — Z66 Do not resuscitate: Secondary | ICD-10-CM | POA: Diagnosis present

## 2023-03-16 DIAGNOSIS — M4856XA Collapsed vertebra, not elsewhere classified, lumbar region, initial encounter for fracture: Secondary | ICD-10-CM | POA: Diagnosis present

## 2023-03-16 DIAGNOSIS — R197 Diarrhea, unspecified: Secondary | ICD-10-CM | POA: Diagnosis not present

## 2023-03-16 DIAGNOSIS — E861 Hypovolemia: Secondary | ICD-10-CM | POA: Diagnosis present

## 2023-03-16 DIAGNOSIS — K5730 Diverticulosis of large intestine without perforation or abscess without bleeding: Secondary | ICD-10-CM | POA: Diagnosis present

## 2023-03-16 DIAGNOSIS — K8064 Calculus of gallbladder and bile duct with chronic cholecystitis without obstruction: Principal | ICD-10-CM | POA: Diagnosis present

## 2023-03-16 DIAGNOSIS — R64 Cachexia: Secondary | ICD-10-CM | POA: Diagnosis present

## 2023-03-16 DIAGNOSIS — Z9842 Cataract extraction status, left eye: Secondary | ICD-10-CM

## 2023-03-16 DIAGNOSIS — R112 Nausea with vomiting, unspecified: Secondary | ICD-10-CM | POA: Diagnosis not present

## 2023-03-16 DIAGNOSIS — K8044 Calculus of bile duct with chronic cholecystitis without obstruction: Secondary | ICD-10-CM | POA: Diagnosis present

## 2023-03-16 DIAGNOSIS — Z9841 Cataract extraction status, right eye: Secondary | ICD-10-CM

## 2023-03-16 DIAGNOSIS — J45909 Unspecified asthma, uncomplicated: Secondary | ICD-10-CM | POA: Diagnosis present

## 2023-03-16 DIAGNOSIS — Z79899 Other long term (current) drug therapy: Secondary | ICD-10-CM

## 2023-03-16 DIAGNOSIS — E878 Other disorders of electrolyte and fluid balance, not elsewhere classified: Secondary | ICD-10-CM | POA: Diagnosis present

## 2023-03-16 DIAGNOSIS — K828 Other specified diseases of gallbladder: Secondary | ICD-10-CM | POA: Diagnosis present

## 2023-03-16 DIAGNOSIS — N179 Acute kidney failure, unspecified: Secondary | ICD-10-CM | POA: Diagnosis present

## 2023-03-16 DIAGNOSIS — Z681 Body mass index (BMI) 19 or less, adult: Secondary | ICD-10-CM

## 2023-03-16 DIAGNOSIS — D631 Anemia in chronic kidney disease: Secondary | ICD-10-CM | POA: Diagnosis present

## 2023-03-16 DIAGNOSIS — K8020 Calculus of gallbladder without cholecystitis without obstruction: Secondary | ICD-10-CM

## 2023-03-16 LAB — URINALYSIS, W/ REFLEX TO CULTURE (INFECTION SUSPECTED)
Bilirubin Urine: NEGATIVE
Glucose, UA: NEGATIVE mg/dL
Hgb urine dipstick: NEGATIVE
Ketones, ur: NEGATIVE mg/dL
Leukocytes,Ua: NEGATIVE
Nitrite: NEGATIVE
Protein, ur: 100 mg/dL — AB
Specific Gravity, Urine: 1.013 (ref 1.005–1.030)
pH: 5 (ref 5.0–8.0)

## 2023-03-16 LAB — CBC WITH DIFFERENTIAL/PLATELET
Abs Immature Granulocytes: 1.32 10*3/uL — ABNORMAL HIGH (ref 0.00–0.07)
Basophils Absolute: 0 10*3/uL (ref 0.0–0.1)
Basophils Relative: 0 %
Eosinophils Absolute: 0 10*3/uL (ref 0.0–0.5)
Eosinophils Relative: 0 %
HCT: 29.5 % — ABNORMAL LOW (ref 36.0–46.0)
Hemoglobin: 8.8 g/dL — ABNORMAL LOW (ref 12.0–15.0)
Immature Granulocytes: 6 %
Lymphocytes Relative: 9 %
Lymphs Abs: 2 10*3/uL (ref 0.7–4.0)
MCH: 28.5 pg (ref 26.0–34.0)
MCHC: 29.8 g/dL — ABNORMAL LOW (ref 30.0–36.0)
MCV: 95.5 fL (ref 80.0–100.0)
Monocytes Absolute: 5.9 10*3/uL — ABNORMAL HIGH (ref 0.1–1.0)
Monocytes Relative: 27 %
Neutro Abs: 12.6 10*3/uL — ABNORMAL HIGH (ref 1.7–7.7)
Neutrophils Relative %: 58 %
Platelets: 158 10*3/uL (ref 150–400)
RBC: 3.09 MIL/uL — ABNORMAL LOW (ref 3.87–5.11)
RDW: 16.5 % — ABNORMAL HIGH (ref 11.5–15.5)
Smear Review: NORMAL
WBC: 21.9 10*3/uL — ABNORMAL HIGH (ref 4.0–10.5)
nRBC: 0.5 % — ABNORMAL HIGH (ref 0.0–0.2)

## 2023-03-16 LAB — LIPASE, BLOOD: Lipase: 56 U/L — ABNORMAL HIGH (ref 11–51)

## 2023-03-16 LAB — COMPREHENSIVE METABOLIC PANEL
ALT: 17 U/L (ref 0–44)
AST: 33 U/L (ref 15–41)
Albumin: 3.7 g/dL (ref 3.5–5.0)
Alkaline Phosphatase: 128 U/L — ABNORMAL HIGH (ref 38–126)
Anion gap: 11 (ref 5–15)
BUN: 49 mg/dL — ABNORMAL HIGH (ref 8–23)
CO2: 19 mmol/L — ABNORMAL LOW (ref 22–32)
Calcium: 8.8 mg/dL — ABNORMAL LOW (ref 8.9–10.3)
Chloride: 106 mmol/L (ref 98–111)
Creatinine, Ser: 1.82 mg/dL — ABNORMAL HIGH (ref 0.44–1.00)
GFR, Estimated: 25 mL/min — ABNORMAL LOW (ref >60)
Glucose, Bld: 137 mg/dL — ABNORMAL HIGH (ref 70–99)
Potassium: 4.3 mmol/L (ref 3.5–5.1)
Sodium: 136 mmol/L (ref 135–145)
Total Bilirubin: 0.9 mg/dL (ref 0.3–1.2)
Total Protein: 7.4 g/dL (ref 6.5–8.1)

## 2023-03-16 LAB — CREATININE, SERUM
Creatinine, Ser: 1.87 mg/dL — ABNORMAL HIGH (ref 0.44–1.00)
GFR, Estimated: 24 mL/min — ABNORMAL LOW (ref >60)

## 2023-03-16 LAB — TROPONIN I (HIGH SENSITIVITY)
Troponin I (High Sensitivity): 12 ng/L (ref <18)
Troponin I (High Sensitivity): 15 ng/L (ref <18)

## 2023-03-16 LAB — CBC
HCT: 21.7 % — ABNORMAL LOW (ref 36.0–46.0)
Hemoglobin: 6.7 g/dL — ABNORMAL LOW (ref 12.0–15.0)
MCH: 29.1 pg (ref 26.0–34.0)
MCHC: 30.9 g/dL (ref 30.0–36.0)
MCV: 94.3 fL (ref 80.0–100.0)
Platelets: 168 10*3/uL (ref 150–400)
RBC: 2.3 MIL/uL — ABNORMAL LOW (ref 3.87–5.11)
RDW: 16.8 % — ABNORMAL HIGH (ref 11.5–15.5)
WBC: 16.1 10*3/uL — ABNORMAL HIGH (ref 4.0–10.5)
nRBC: 0.4 % — ABNORMAL HIGH (ref 0.0–0.2)

## 2023-03-16 LAB — LACTIC ACID, PLASMA
Lactic Acid, Venous: 1.5 mmol/L (ref 0.5–1.9)
Lactic Acid, Venous: 1.2 mmol/L (ref 0.5–1.9)

## 2023-03-16 MED ORDER — HEPARIN SODIUM (PORCINE) 5000 UNIT/ML IJ SOLN
5000.0000 [IU] | Freq: Two times a day (BID) | INTRAMUSCULAR | Status: DC
  Administered 2023-03-16 – 2023-03-18 (×3): 5000 [IU] via SUBCUTANEOUS
  Filled 2023-03-16 (×4): qty 1

## 2023-03-16 MED ORDER — ONDANSETRON HCL 4 MG/2ML IJ SOLN
4.0000 mg | Freq: Once | INTRAMUSCULAR | Status: AC
  Administered 2023-03-16: 4 mg via INTRAVENOUS
  Filled 2023-03-16: qty 2

## 2023-03-16 MED ORDER — SODIUM CHLORIDE 0.9 % IV BOLUS
1000.0000 mL | Freq: Once | INTRAVENOUS | Status: AC
  Administered 2023-03-16: 1000 mL via INTRAVENOUS

## 2023-03-16 MED ORDER — ESCITALOPRAM OXALATE 10 MG PO TABS
5.0000 mg | ORAL_TABLET | Freq: Every day | ORAL | Status: DC
  Administered 2023-03-17 – 2023-03-18 (×2): 5 mg via ORAL
  Filled 2023-03-16 (×2): qty 1

## 2023-03-16 MED ORDER — METRONIDAZOLE 500 MG/100ML IV SOLN
500.0000 mg | Freq: Three times a day (TID) | INTRAVENOUS | Status: DC
  Filled 2023-03-16 (×2): qty 100

## 2023-03-16 MED ORDER — METRONIDAZOLE 500 MG/100ML IV SOLN
500.0000 mg | Freq: Once | INTRAVENOUS | Status: AC
  Administered 2023-03-16: 500 mg via INTRAVENOUS
  Filled 2023-03-16: qty 100

## 2023-03-16 MED ORDER — METRONIDAZOLE 500 MG/100ML IV SOLN
500.0000 mg | Freq: Two times a day (BID) | INTRAVENOUS | Status: DC
  Administered 2023-03-17 – 2023-03-18 (×3): 500 mg via INTRAVENOUS
  Filled 2023-03-16 (×5): qty 100

## 2023-03-16 MED ORDER — ACETAMINOPHEN 325 MG PO TABS: 650.0000 mg | ORAL_TABLET | Freq: Four times a day (QID) | ORAL | Status: DC | PRN

## 2023-03-16 MED ORDER — SODIUM CHLORIDE 0.9 % IV SOLN
1.0000 g | INTRAVENOUS | Status: DC
  Administered 2023-03-17: 1 g via INTRAVENOUS
  Filled 2023-03-16 (×2): qty 10

## 2023-03-16 MED ORDER — SODIUM CHLORIDE 0.9 % IV SOLN
1.0000 g | Freq: Once | INTRAVENOUS | Status: AC
  Administered 2023-03-16: 1 g via INTRAVENOUS
  Filled 2023-03-16: qty 10

## 2023-03-16 MED ORDER — SODIUM CHLORIDE 0.9% FLUSH
3.0000 mL | Freq: Two times a day (BID) | INTRAVENOUS | Status: DC
  Administered 2023-03-16 – 2023-03-18 (×4): 3 mL via INTRAVENOUS

## 2023-03-16 MED ORDER — SODIUM CHLORIDE 0.9 % IV SOLN: INTRAVENOUS | Status: AC

## 2023-03-16 MED ORDER — SODIUM BICARBONATE 650 MG PO TABS
650.0000 mg | ORAL_TABLET | Freq: Two times a day (BID) | ORAL | Status: DC
  Administered 2023-03-16 – 2023-03-18 (×3): 650 mg via ORAL
  Filled 2023-03-16 (×5): qty 1

## 2023-03-16 MED ORDER — ACETAMINOPHEN 650 MG RE SUPP: 650.0000 mg | Freq: Four times a day (QID) | RECTAL | Status: DC | PRN

## 2023-03-16 NOTE — ED Notes (Signed)
X-ray at bedside

## 2023-03-16 NOTE — Assessment & Plan Note (Signed)
Lab Results  Component Value Date   CREATININE 1.82 (H) 03/16/2023   CREATININE 1.56 (H) 01/30/2023   CREATININE 1.47 (H) 01/29/2023  Mild worsening in setting of hypovolemia and n/v and diarrhea. Cont with gentle MIVF hydration.

## 2023-03-16 NOTE — ED Provider Notes (Signed)
[facility] Provider Note   Event Date/Time   First MD Initiated Contact with Patient 03/16/23 1255     (approximate) History  Abdominal Pain  HPI Julia Kerr is a 89 y.o. female who presents via EMS with complaints of abdominal pain.  Patient is a poor historian and most history taken from patient's son who is at bedside stating that patient began having nausea/vomiting after eating breakfast this morning and complaining of midepigastric and right upper quadrant abdominal pain.  Son denies patient having any similar symptoms in the past or recently.  Patient's son denies any recent travel or sick contacts. ROS: Unable to assess   Physical Exam  Triage Vital Signs: ED Triage Vitals  Enc Vitals Group     BP 03/16/23 1257 135/64     Pulse Rate 03/16/23 1257 85     Resp 03/16/23 1257 (!) 25     Temp 03/16/23 1257 (!) 97.3 F (36.3 C)     Temp Source 03/16/23 1257 Oral     SpO2 03/16/23 1257 100 %     Weight 03/16/23 1253 92 lb 2.4 oz (41.8 kg)     Height 03/16/23 1253 4\' 8"  (1.422 m)     Head Circumference --      Peak Flow --      Pain Score 03/16/23 1252 0     Pain Loc --      Pain Edu? --      Excl. in [place]? --    Most recent vital signs: Vitals:   03/16/23 1400 03/16/23 1415  BP: (!) 117/55   Pulse: 82 81  Resp: (!) 23 (!) 25  Temp:    SpO2: 100% 100%   General: Awake, uncooperative. CV:  Good peripheral perfusion.  Resp:  Normal effort.  Abd:  No distention.  Other:  Elderly cachectic Hispanic female laying in bed in no acute distress ED Results / Procedures / Treatments  Labs (all labs ordered are listed, but only abnormal results are displayed) Labs Reviewed  COMPREHENSIVE METABOLIC PANEL - Abnormal; Notable for the following components:      Result Value   CO2 19 (*)    Glucose, Bld 137 (*)    BUN 49 (*)    Creatinine, Ser 1.82 (*)    Calcium 8.8 (*)    Alkaline Phosphatase 128 (*)    GFR, Estimated 25 (*)    All other  components within normal limits  CBC WITH DIFFERENTIAL/PLATELET - Abnormal; Notable for the following components:   WBC 21.9 (*)    RBC 3.09 (*)    Hemoglobin 8.8 (*)    HCT 29.5 (*)    MCHC 29.8 (*)    RDW 16.5 (*)    nRBC 0.5 (*)    Neutro Abs 12.6 (*)    Monocytes Absolute 5.9 (*)    Abs Immature Granulocytes 1.32 (*)    All other components within normal limits  LIPASE, BLOOD - Abnormal; Notable for the following components:   Lipase 56 (*)    All other components within normal limits  CULTURE, BLOOD (ROUTINE X 2)  CULTURE, BLOOD (ROUTINE X 2)  LACTIC ACID, PLASMA  LACTIC ACID, PLASMA  URINALYSIS, W/ REFLEX TO CULTURE (INFECTION SUSPECTED)  TROPONIN I (HIGH SENSITIVITY)  TROPONIN I (HIGH SENSITIVITY)   EKG ED ECG REPORT I, Naaman Plummer, the attending physician, personally viewed and interpreted this ECG. Date: 03/16/2023 EKG Time: 1254 Rate: 85 rhythm Rhythm: normal sinus rhythm QRS Axis:  normal Intervals: RBBB ST/T Wave abnormalities: normal Narrative Interpretation: Regular sinus rhythm with right bundle branch block and premature atrial contractions.  No evidence of acute ischemia RADIOLOGY ED MD interpretation: Pending -Agree with radiology assessment Official radiology report(s): No results found. PROCEDURES: Critical Care performed: No Procedures MEDICATIONS ORDERED IN ED: Medications  metroNIDAZOLE (FLAGYL) IVPB 500 mg (has no administration in time range)  ondansetron (ZOFRAN) injection 4 mg (4 mg Intravenous Given 03/16/23 1351)  sodium chloride 0.9 % bolus 1,000 mL (1,000 mLs Intravenous New Bag/Given 03/16/23 1357)   IMPRESSION / MDM / ASSESSMENT AND PLAN / ED COURSE  I reviewed the triage vital signs and the nursing notes.                             The patient is on the cardiac monitor to evaluate for evidence of arrhythmia and/or significant heart rate changes. Patient's presentation is most consistent with acute presentation with potential  threat to life or bodily function. Patients symptoms not typical for emergent causes of abdominal pain such as, but not limited to, appendicitis, abdominal aortic aneurysm, surgical biliary disease, pancreatitis, SBO, mesenteric ischemia, serious intra-abdominal bacterial illness. Presentation also not typical of gynecologic emergencies such as TOA, Ovarian Torsion, PID. Not Ectopic. Doubt atypical ACS.  Patient pending right upper quadrant ultrasound Care of this patient will be signed out to the oncoming physician at the end of my shift.  All pertinent patient information conveyed and all questions answered.  All further care and disposition decisions will be made by the oncoming physician. FINAL CLINICAL IMPRESSION(S) / ED DIAGNOSES   Final diagnoses:  Epigastric pain   Rx / DC Orders   ED Discharge Orders     None      Note:  This document was prepared using Dragon voice recognition software and may include unintentional dictation errors.   Naaman Plummer, MD 03/16/23 [phone]

## 2023-03-16 NOTE — Assessment & Plan Note (Signed)
Swallow test.  Nutritionist consult.  Npo except for meds overnight.

## 2023-03-16 NOTE — Assessment & Plan Note (Addendum)
2/2 cholelithiasis. ? If pt had a CBD stone that she passed.  Pt has BCD dilation on CT.  IV PPI. Gen surg  Consult order and am message sent.  Pain control with tylenol.

## 2023-03-16 NOTE — ED Notes (Addendum)
Patient given water and applesauce per request. Patient tolerated well per swallow screen.

## 2023-03-16 NOTE — Hospital Course (Signed)
Abd pain n/v/ ? Cholelithiasis. No evidence of cholecystitis.  Exam was benign. Dr.Cintron not good surgical candidate.  24 hour OBS. DNR.

## 2023-03-16 NOTE — ED Triage Notes (Addendum)
Pt comes in via [facility] from home with complaints of abdominal pain. According to EMS, the the pt has a history of gallstones and gallbladder flair up's, and when she has the flair up, she experiences nausea, vomiting and diarrhea. EMS also states that the pt complains of pain when she is having an instance of diarrhea. Pt was vomiting when EMS arrived on scene.  Pt is a poor historian. Pt's son is on the way to the hospital. Pt is alert and oriented x1, and sleeping off and on during triage questions. N no obvious signs of distress at this time. EMS Vitals: 124/52 92 HR 100%RA 157 Glucose

## 2023-03-16 NOTE — ED Notes (Signed)
ED TO INPATIENT HANDOFF REPORT  ED Nurse Name and Phone #: Lanette Hampshire [phone]  S Name/Age/Gender Julia Kerr 89 y.o. female Room/Bed: ED15A/ED15A  Code Status   Code Status: DNR  Home/SNF/Other Home Patient oriented to: self, place, time, and situation Is this baseline? Yes   Triage Complete: Triage complete  Chief Complaint Nausea vomiting and diarrhea [R11.2, R19.7]  Triage Note Pt comes in via [facility] from home with complaints of abdominal pain. According to EMS, the the pt has a history of gallstones and gallbladder flair up's, and when she has the flair up, she experiences nausea, vomiting and diarrhea. EMS also states that the pt complains of pain when she is having an instance of diarrhea. Pt was vomiting when EMS arrived on scene.  Pt is a poor historian. Pt's son is on the way to the hospital. Pt is alert and oriented x1, and sleeping off and on during triage questions. N no obvious signs of distress at this time. EMS Vitals: 124/52 92 HR 100%RA 157 Glucose   Allergies Allergies  Allergen Reactions   Eggs-Apples-Oats [Alitraq] Other (See Comments)    Strawberries and Apples cause burning of lips   Food Other (See Comments)    Apples and strawberries cause burning of lips   Strawberry (Diagnostic) Other (See Comments)    Strawberry & apple causes burning of the lips   Strawberry Extract Other (See Comments)    Strawberry & apple allergy----causes burning of the lips    Level of Care/Admitting Diagnosis ED Disposition     ED Disposition  Admit   Condition  --   [place]: [place] [[id number]]  Level of Care: Telemetry Medical [[id number]]  Covid Evaluation: Asymptomatic - no recent exposure (last 10 days) testing not required  Diagnosis: Nausea vomiting and diarrhea [id number]  Admitting Physician: Cherylann Ratel  Attending Physician: Cherylann Ratel          B Medical/Surgery History Past Medical  History:  Diagnosis Date   Anemia    Past Surgical History:  Procedure Laterality Date   CATARACT EXTRACTION, BILATERAL     SHOULDER SURGERY       A IV Location/Drains/Wounds Patient Lines/Drains/Airways Status     Active Line/Drains/Airways     Name Placement date Placement time Site Days   Peripheral IV 03/16/23 22 G Anterior;Right Forearm 03/16/23  1349  Forearm  less than 1   Peripheral IV 03/16/23 22 G Left Antecubital 03/16/23  1349  Antecubital  less than 1   Pressure Injury 01/29/23 Vertebral column Lower Stage 1 -  Intact skin with non-blanchable redness of a localized area usually over a bony prominence. Redness, blancable 01/29/23  0010  -- 46   Pressure Injury 01/29/23 Heel Right Stage 1 -  Intact skin with non-blanchable redness of a localized area usually over a bony prominence. scab redness 01/29/23  0015  -- 46   Wound / Incision (Open or Dehisced) 08/03/22 Skin tear Arm Left;Lower;Posterior 08/03/22  2130  Arm  225            Intake/Output Last 24 hours  Intake/Output Summary (Last 24 hours) at 03/16/2023 2028 Last data filed at 03/16/2023 1959 Gross per 24 hour  Intake 100 ml  Output --  Net 100 ml    Labs/Imaging Results for orders placed or performed during the hospital encounter of 03/16/23 (from the past 48 hour(s))  Lactic acid, plasma     Status: None  Collection Time: 03/16/23  1:48 PM  Result Value Ref Range   Lactic Acid, Venous 1.5 0.5 - 1.9 mmol/L    Comment: Performed at [facility], [address] [place], [place] [address]  Urinalysis, w/ Reflex to Culture (Infection Suspected) -Urine, Clean Catch     Status: Abnormal   Collection Time: 03/16/23  1:48 PM  Result Value Ref Range   Specimen Source URINE, CLEAN CATCH    Color, Urine YELLOW (A) YELLOW   APPearance CLOUDY (A) CLEAR   Specific Gravity, Urine 1.013 1.005 - 1.030   pH 5.0 5.0 - 8.0   Glucose, UA NEGATIVE NEGATIVE mg/dL   Hgb urine dipstick NEGATIVE NEGATIVE    Bilirubin Urine NEGATIVE NEGATIVE   Ketones, ur NEGATIVE NEGATIVE mg/dL   Protein, ur 100 (A) NEGATIVE mg/dL   Nitrite NEGATIVE NEGATIVE   Leukocytes,Ua NEGATIVE NEGATIVE   RBC / HPF 0-5 0 - 5 RBC/hpf   WBC, UA 11-20 0 - 5 WBC/hpf    Comment:        Reflex urine culture not performed if WBC <=10, OR if Squamous epithelial cells >5. If Squamous epithelial cells >5 suggest recollection.    Bacteria, UA FEW (A) NONE SEEN   Squamous Epithelial / HPF 0-5 0 - 5 /HPF    Comment: Performed at [facility], [address] [place], [place] [address]  Comprehensive metabolic panel     Status: Abnormal   Collection Time: 03/16/23  1:50 PM  Result Value Ref Range   Sodium 136 135 - 145 mmol/L   Potassium 4.3 3.5 - 5.1 mmol/L   Chloride 106 98 - 111 mmol/L   CO2 19 (L) 22 - 32 mmol/L   Glucose, Bld 137 (H) 70 - 99 mg/dL    Comment: Glucose reference range applies only to samples taken after fasting for at least 8 hours.   BUN 49 (H) 8 - 23 mg/dL   Creatinine, Ser 1.82 (H) 0.44 - 1.00 mg/dL   Calcium 8.8 (L) 8.9 - 10.3 mg/dL   Total Protein 7.4 6.5 - 8.1 g/dL   Albumin 3.7 3.5 - 5.0 g/dL   AST 33 15 - 41 U/L   ALT 17 0 - 44 U/L   Alkaline Phosphatase 128 (H) 38 - 126 U/L   Total Bilirubin 0.9 0.3 - 1.2 mg/dL   GFR, Estimated 25 (L) >60 mL/min    Comment: (NOTE) Calculated using the CKD-EPI Creatinine Equation (2021)    Anion gap 11 5 - 15    Comment: Performed at [facility], [address] [place], [place] [address]  CBC with Differential     Status: Abnormal   Collection Time: 03/16/23  1:50 PM  Result Value Ref Range   WBC 21.9 (H) 4.0 - 10.5 K/uL   RBC 3.09 (L) 3.87 - 5.11 MIL/uL   Hemoglobin 8.8 (L) 12.0 - 15.0 g/dL   HCT 29.5 (L) 36.0 - 46.0 %   MCV 95.5 80.0 - 100.0 fL   MCH 28.5 26.0 - 34.0 pg   MCHC 29.8 (L) 30.0 - 36.0 g/dL   RDW 16.5 (H) 11.5 - 15.5 %   Platelets 158 150 - 400 K/uL   nRBC 0.5 (H) 0.0 - 0.2 %   Neutrophils Relative % 58 %    Neutro Abs 12.6 (H) 1.7 - 7.7 K/uL   Lymphocytes Relative 9 %   Lymphs Abs 2.0 0.7 - 4.0 K/uL   Monocytes Relative 27 %   Monocytes Absolute  5.9 (H) 0.1 - 1.0 K/uL   Eosinophils Relative 0 %   Eosinophils Absolute 0.0 0.0 - 0.5 K/uL   Basophils Relative 0 %   Basophils Absolute 0.0 0.0 - 0.1 K/uL   WBC Morphology MORPHOLOGY UNREMARKABLE    RBC Morphology MIXED RBC POPULATION    Smear Review Normal platelet morphology    Immature Granulocytes 6 %   Abs Immature Granulocytes 1.32 (H) 0.00 - 0.07 K/uL    Comment: Performed at [facility], [address] [place], [place] [address]  Troponin I (High Sensitivity)     Status: None   Collection Time: 03/16/23  1:50 PM  Result Value Ref Range   Troponin I (High Sensitivity) 12 <18 ng/L    Comment: (NOTE) Elevated high sensitivity troponin I (hsTnI) values and significant  changes across serial measurements may suggest ACS but many other  chronic and acute conditions are known to elevate hsTnI results.  Refer to the "Links" section for chest pain algorithms and additional  guidance. Performed at [facility], [address] [place], [place] [address]   Lipase, blood     Status: Abnormal   Collection Time: 03/16/23  1:50 PM  Result Value Ref Range   Lipase 56 (H) 11 - 51 U/L    Comment: Performed at [facility], [place], [place] [address]  Troponin I (High Sensitivity)     Status: None   Collection Time: 03/16/23  3:24 PM  Result Value Ref Range   Troponin I (High Sensitivity) 15 <18 ng/L    Comment: (NOTE) Elevated high sensitivity troponin I (hsTnI) values and significant  changes across serial measurements may suggest ACS but many other  chronic and acute conditions are known to elevate hsTnI results.  Refer to the "Links" section for chest pain algorithms and additional  guidance. Performed at [facility], [address] [place], [place] [address]   Lactic acid,  plasma     Status: None   Collection Time: 03/16/23  3:26 PM  Result Value Ref Range   Lactic Acid, Venous 1.2 0.5 - 1.9 mmol/L    Comment: Performed at [facility], [address] [place], [place] [address]   CT ABDOMEN PELVIS WO CONTRAST  Result Date: 03/16/2023 CLINICAL DATA:  Abdominal pain, sepsis EXAM: CT ABDOMEN AND PELVIS WITHOUT CONTRAST TECHNIQUE: Multidetector CT imaging of the abdomen and pelvis was performed following the standard protocol without IV contrast. RADIATION DOSE REDUCTION: This exam was performed according to the departmental dose-optimization program which includes automated exposure control, adjustment of the mA and/or kV according to patient size and/or use of iterative reconstruction technique. COMPARISON:  None Available. FINDINGS: Lower chest: Abnormal paraspinal soft tissue at the inferior chest, including a 15 x 20 mm LEFT paraspinal mass, 16 x 10 mm RIGHT paraspinal mass, and a 26 x 18 mm RIGHT paraspinal mass. Additional nodular pleural thickening extends along the posterior pleural surface in the RIGHT chest. No intrapulmonary mass or nodule. Enlargement of cardiac chambers. Low-attenuation of circulating blood question anemia. Hepatobiliary: Large calculi within gallbladder. Questionable gallbladder wall thickening. Small focus of gas within the gallbladder versus gallbladder wall. Question mild intrahepatic biliary dilatation. Mass RIGHT lobe liver 13 x 12 mm image 21 likely small cyst. No additional focal hepatic abnormalities. Pancreas: Atrophic pancreas without mass Spleen: Splenomegaly, spleen measuring 13.1 x 4.5 x 17.2 cm (volume = 530 cm^3). No focal mass. Adrenals/Urinary Tract: Adrenal glands unremarkable. Multiple calcifications in the kidneys,  many renal vascular, some representing potentially small nonobstructing calculi. Small cyst inferior pole LEFT kidney 11 mm diameter; no follow-up imaging recommended. No hydronephrosis, hydroureter, or  ureteral calcification. Bladder unremarkable. Stomach/Bowel: Normal appendix. Scattered colonic diverticulosis without evidence of diverticulitis. Stomach and bowel loops otherwise unremarkable for technique. Vascular/Lymphatic: Extensive atherosclerotic calcifications. Aorta normal caliber. No adenopathy. Reproductive: Atrophic uterus and LEFT ovary. Cyst RIGHT ovary 21 x 20 mm; this is a simple appearing cyst and no follow-up imaging is recommended. Other: No free air or free fluid. No hernia or inflammatory process. Musculoskeletal: Osseous demineralization. Old healed fractures of LEFT superior and inferior pubic rami. Marked compression deformity of L5 vertebral body question minimally progressive since radiographs of 02/11/2023 superior endplate concavity L3 age indeterminate. IMPRESSION: Cholelithiasis with questionable gallbladder wall thickening and question mild intrahepatic biliary dilatation as well as a single focus of gas within the gallbladder or gallbladder wall; further evaluation by ultrasound recommended. Splenomegaly of uncertain chronicity. Colonic diverticulosis without evidence of diverticulitis. Abnormal paraspinal soft tissue nodules and thickening at the inferior chest as above; these are nonspecific, could represent pleural-based disease or tumor, paraspinal masses such as neural/ nerve sheath tumors, extramedullary hematopoiesis, and other etiologies; no prior imaging to assess chronicity and only assessed at the inferior chest, recommend CT chest assessment Marked compression deformity of L5 vertebral body with sclerosis question minimally progressive since 02/11/2023. Extensive atherosclerotic calcifications. Aortic Atherosclerosis (ICD10-I70.0). Electronically Signed   By: Lavonia Dana M.D.   On: 03/16/2023 16:14   DG Chest Portable 1 View  Result Date: 03/16/2023 CLINICAL DATA:  Abdominal pain EXAM: PORTABLE CHEST 1 VIEW COMPARISON:  X-ray 01/28/2023 FINDINGS: Diffuse interstitial  changes, unchanged from prior and likely chronic. No consolidation, pneumothorax or effusion. Normal cardiopericardial silhouette. Overlapping cardiac leads. Osteopenia with degenerative changes. Fixation hardware along the proximal right humerus. IMPRESSION: Hyperinflation with likely chronic interstitial changes. No effusion or consolidation. Electronically Signed   By: Jill Side M.D.   On: 03/16/2023 15:51   US Abdomen Limited RUQ (LIVER/GB)  Result Date: 03/16/2023 CLINICAL DATA:  Right upper quadrant pain EXAM: ULTRASOUND ABDOMEN LIMITED RIGHT UPPER QUADRANT COMPARISON:  None Available. FINDINGS: Gallbladder: Multiple calculi with distal shadowing in the gallbladder. No pericholecystic fluid. Sonographer describes no sonographic Murphy's sign. In the gallbladder limiting distal evaluation Common bile duct: Diameter: 2 mm. No intrahepatic biliary ductal dilatation identified. Liver: 1.1 cm simple appearing cyst in the right lobe, previously 1.3 cm on 12/11/2020. No new lesion identified. Within normal limits in parenchymal echogenicity. Portal vein is patent on color Doppler imaging with normal direction of blood flow towards the liver. Other: None. IMPRESSION: Cholelithiasis. Electronically Signed   By: Lucrezia Europe M.D.   On: 03/16/2023 15:23    Pending Labs Unresulted Labs (From admission, onward)     Start     Ordered   03/17/23 0500  CBC  Tomorrow morning,   STAT        03/16/23 2006   03/17/23 0500  Comprehensive metabolic panel  Tomorrow morning,   STAT        03/16/23 2006   03/17/23 0500  Magnesium  Tomorrow morning,   STAT        03/16/23 2006   03/17/23 0500  Phosphorus  Tomorrow morning,   STAT        03/16/23 2006   03/16/23 2001  CBC  (heparin)  Once,   STAT       Comments: Baseline for heparin therapy IF NOT ALREADY DRAWN.  Notify MD if PLT < 100 K.    03/16/23 2006   03/16/23 2001  Creatinine, serum  (heparin)  Once,   STAT       Comments: Baseline for heparin therapy IF  NOT ALREADY DRAWN.    03/16/23 2006   03/16/23 1515  Culture, blood (routine x 2)  BLOOD CULTURE X 2,   STAT      03/16/23 1514   03/16/23 1348  Urine Culture  Once,   R        03/16/23 1348            Vitals/Pain Today's Vitals   03/16/23 1600 03/16/23 1830 03/16/23 1900 03/16/23 1930  BP: (!) 117/50  (!) 101/52 (!) 108/52  Pulse: 83  85 86  Resp: 18  (!) 21 20  Temp:  98.2 F (36.8 C)    TempSrc:  Oral    SpO2: 100%  100% 99%  Weight:      Height:      PainSc:        Isolation Precautions No active isolations  Medications Medications  sodium bicarbonate tablet 650 mg (has no administration in time range)  escitalopram (LEXAPRO) tablet 5 mg (has no administration in time range)  heparin injection 5,000 Units (has no administration in time range)  sodium chloride flush (NS) 0.9 % injection 3 mL (has no administration in time range)  0.9 %  sodium chloride infusion (has no administration in time range)  acetaminophen (TYLENOL) tablet 650 mg (has no administration in time range)    Or  acetaminophen (TYLENOL) suppository 650 mg (has no administration in time range)  ondansetron (ZOFRAN) injection 4 mg (4 mg Intravenous Given 03/16/23 1351)  sodium chloride 0.9 % bolus 1,000 mL (0 mLs Intravenous Stopped 03/16/23 1527)  metroNIDAZOLE (FLAGYL) IVPB 500 mg (0 mg Intravenous Stopped 03/16/23 1830)  cefTRIAXone (ROCEPHIN) 1 g in sodium chloride 0.9 % 100 mL IVPB (0 g Intravenous Stopped 03/16/23 1959)    Mobility manual wheelchair       R Recommendations: See Admitting Provider Note  Report given to:   Additional Notes: Patient is spanish speaking, alert and oriented x 4.  Son has been at bedside.

## 2023-03-16 NOTE — ED Notes (Signed)
Ultrasound at bedside

## 2023-03-16 NOTE — Assessment & Plan Note (Addendum)
2/2 N/V/D Monitor and replace.

## 2023-03-16 NOTE — ED Notes (Signed)
Pt is a poor historian. Pt refusing to have iv placed at this time. Waiting for pt's son to arrive to discuss pt's plan of care. MD [place] notified.

## 2023-03-16 NOTE — H&P (Signed)
History and Physical    Chief Complaint: Abdominal pain.   HISTORY OF PRESENT ILLNESS: Julia Kerr is an 89 y.o. female seen today with c/o abdominal pain and gall bladder flare up and pt has h/o gall bladder colic cholecystitis since long time 2013 and son wishes they would have had it surgically treated then. Pt is cooperative and verbal but not able to give hpi.   Pt has  Past Medical History:  Diagnosis Date   Anemia    Review of Systems  Unable to perform ROS: Age  Gastrointestinal:  Positive for abdominal pain, diarrhea, nausea and vomiting.  All other systems reviewed and are negative.  Allergies  Allergen Reactions   Eggs-Apples-Oats [Alitraq] Other (See Comments)    Strawberries and Apples cause burning of lips   Food Other (See Comments)    Apples and strawberries cause burning of lips   Strawberry (Diagnostic) Other (See Comments)    Strawberry & apple causes burning of the lips   Strawberry Extract Other (See Comments)    Strawberry & apple allergy----causes burning of the lips   Past Surgical History:  Procedure Laterality Date   CATARACT EXTRACTION, BILATERAL     SHOULDER SURGERY      MEDICATIONS: Current Outpatient Medications  Medication Instructions   escitalopram (LEXAPRO) 5 mg, Oral, Daily   Multiple Vitamin (MULTIVITAMIN WITH MINERALS) TABS tablet 1 tablet, Oral, Daily   Polyethylene Glycol 400 (VISINE DRY EYE RELIEF OP) 1 drop, Both Eyes, As needed   sodium bicarbonate 650 mg, Oral, 2 times daily     [START ON 03/17/2023] escitalopram  5 mg Oral Daily   heparin  5,000 Units Subcutaneous Q12H   sodium bicarbonate  650 mg Oral BID   sodium chloride flush  3 mL Intravenous Q12H     sodium chloride     ED Course: Pt in Ed is awake. And afebrile. Son at bedside states that mo has had gall bladder issue since 2013 if he could go back he would have opted to have it surgically removed then.  Vitals:   03/16/23 2030 03/16/23 2100 03/16/23 2130  03/16/23 2154  BP: (!) 105/46 (!) 101/53 (!) 117/52 102/62  Pulse: 87 81 84 81  Resp: 17 (!) 21 16 16   Temp:  98 F (36.7 C)  98 F (36.7 C)  TempSrc:  Oral  Oral  SpO2: 97% 99% 99% 100%  Weight:      Height:       Total I/O In: 100 [IV Piggyback:100] Out: -  SpO2: 100 % Blood work in ed shows: Normal Urine.  CMP shows metabolic acidosis/ CKD elevated  alk phos, and chronic anemia.  Results for orders placed or performed during the hospital encounter of 03/16/23 (from the past 24 hour(s))  Lactic acid, plasma     Status: None   Collection Time: 03/16/23  1:48 PM  Result Value Ref Range   Lactic Acid, Venous 1.5 0.5 - 1.9 mmol/L  Urinalysis, w/ Reflex to Culture (Infection Suspected) -Urine, Clean Catch     Status: Abnormal   Collection Time: 03/16/23  1:48 PM  Result Value Ref Range   Specimen Source URINE, CLEAN CATCH    Color, Urine YELLOW (A) YELLOW   APPearance CLOUDY (A) CLEAR   Specific Gravity, Urine 1.013 1.005 - 1.030   pH 5.0 5.0 - 8.0   Glucose, UA NEGATIVE NEGATIVE mg/dL   Hgb urine dipstick NEGATIVE NEGATIVE   Bilirubin Urine NEGATIVE NEGATIVE   Ketones, ur  NEGATIVE NEGATIVE mg/dL   Protein, ur 100 (A) NEGATIVE mg/dL   Nitrite NEGATIVE NEGATIVE   Leukocytes,Ua NEGATIVE NEGATIVE   RBC / HPF 0-5 0 - 5 RBC/hpf   WBC, UA 11-20 0 - 5 WBC/hpf   Bacteria, UA FEW (A) NONE SEEN   Squamous Epithelial / HPF 0-5 0 - 5 /HPF  Comprehensive metabolic panel     Status: Abnormal   Collection Time: 03/16/23  1:50 PM  Result Value Ref Range   Sodium 136 135 - 145 mmol/L   Potassium 4.3 3.5 - 5.1 mmol/L   Chloride 106 98 - 111 mmol/L   CO2 19 (L) 22 - 32 mmol/L   Glucose, Bld 137 (H) 70 - 99 mg/dL   BUN 49 (H) 8 - 23 mg/dL   Creatinine, Ser 1.82 (H) 0.44 - 1.00 mg/dL   Calcium 8.8 (L) 8.9 - 10.3 mg/dL   Total Protein 7.4 6.5 - 8.1 g/dL   Albumin 3.7 3.5 - 5.0 g/dL   AST 33 15 - 41 U/L   ALT 17 0 - 44 U/L   Alkaline Phosphatase 128 (H) 38 - 126 U/L   Total Bilirubin  0.9 0.3 - 1.2 mg/dL   GFR, Estimated 25 (L) >60 mL/min   Anion gap 11 5 - 15  CBC with Differential     Status: Abnormal   Collection Time: 03/16/23  1:50 PM  Result Value Ref Range   WBC 21.9 (H) 4.0 - 10.5 K/uL   RBC 3.09 (L) 3.87 - 5.11 MIL/uL   Hemoglobin 8.8 (L) 12.0 - 15.0 g/dL   HCT 29.5 (L) 36.0 - 46.0 %   MCV 95.5 80.0 - 100.0 fL   MCH 28.5 26.0 - 34.0 pg   MCHC 29.8 (L) 30.0 - 36.0 g/dL   RDW 16.5 (H) 11.5 - 15.5 %   Platelets 158 150 - 400 K/uL   nRBC 0.5 (H) 0.0 - 0.2 %   Neutrophils Relative % 58 %   Neutro Abs 12.6 (H) 1.7 - 7.7 K/uL   Lymphocytes Relative 9 %   Lymphs Abs 2.0 0.7 - 4.0 K/uL   Monocytes Relative 27 %   Monocytes Absolute 5.9 (H) 0.1 - 1.0 K/uL   Eosinophils Relative 0 %   Eosinophils Absolute 0.0 0.0 - 0.5 K/uL   Basophils Relative 0 %   Basophils Absolute 0.0 0.0 - 0.1 K/uL   WBC Morphology MORPHOLOGY UNREMARKABLE    RBC Morphology MIXED RBC POPULATION    Smear Review Normal platelet morphology    Immature Granulocytes 6 %   Abs Immature Granulocytes 1.32 (H) 0.00 - 0.07 K/uL  Troponin I (High Sensitivity)     Status: None   Collection Time: 03/16/23  1:50 PM  Result Value Ref Range   Troponin I (High Sensitivity) 12 <18 ng/L  Lipase, blood     Status: Abnormal   Collection Time: 03/16/23  1:50 PM  Result Value Ref Range   Lipase 56 (H) 11 - 51 U/L  Troponin I (High Sensitivity)     Status: None   Collection Time: 03/16/23  3:24 PM  Result Value Ref Range   Troponin I (High Sensitivity) 15 <18 ng/L  Lactic acid, plasma     Status: None   Collection Time: 03/16/23  3:26 PM  Result Value Ref Range   Lactic Acid, Venous 1.2 0.5 - 1.9 mmol/L   Unresulted Labs (From admission, onward)     Start     Ordered   03/17/23  0500  CBC  Tomorrow morning,   STAT        03/16/23 2006   03/17/23 0500  Comprehensive metabolic panel  Tomorrow morning,   STAT        03/16/23 2006   03/17/23 0500  Magnesium  Tomorrow morning,   STAT        03/16/23  2006   03/17/23 0500  Phosphorus  Tomorrow morning,   STAT        03/16/23 2006   03/16/23 2001  CBC  (heparin)  Once,   STAT       Comments: Baseline for heparin therapy IF NOT ALREADY DRAWN.  Notify MD if PLT < 100 K.    03/16/23 2006   03/16/23 2001  Creatinine, serum  (heparin)  Once,   STAT       Comments: Baseline for heparin therapy IF NOT ALREADY DRAWN.    03/16/23 2006   03/16/23 1515  Culture, blood (routine x 2)  BLOOD CULTURE X 2,   STAT      03/16/23 1514   03/16/23 1348  Urine Culture  Once,   R        03/16/23 1348           Pt has received : Orders Placed This Encounter  Procedures   Culture, blood (routine x 2)    Standing Status:   Standing    Number of Occurrences:   2   Urine Culture    Standing Status:   Standing    Number of Occurrences:   1   US Abdomen Limited RUQ (LIVER/GB)    Standing Status:   Standing    Number of Occurrences:   1    Order Specific Question:   Symptom/Reason for Exam    Answer:   RUQ pain [id number]   DG Chest Portable 1 View    Standing Status:   Standing    Number of Occurrences:   1    Order Specific Question:   Reason for Exam (SYMPTOM  OR DIAGNOSIS REQUIRED)    Answer:   abd pain, eval for infiltrate   CT ABDOMEN PELVIS WO CONTRAST    Standing Status:   Standing    Number of Occurrences:   1    Order Specific Question:   Is Oral Contrast requested for this exam?    Answer:   No oral contrast    Order Specific Question:   Reason for No Oral Contrast    Answer:   Medical necessity (Time Sensitive)   Comprehensive metabolic panel    Standing Status:   Standing    Number of Occurrences:   1   CBC with Differential    Standing Status:   Standing    Number of Occurrences:   1   Lipase, blood    Standing Status:   Standing    Number of Occurrences:   1   Lactic acid, plasma    Standing Status:   Standing    Number of Occurrences:   2   Urinalysis, w/ Reflex to Culture (Infection Suspected) -Urine, Clean Catch     Standing Status:   Standing    Number of Occurrences:   1    Order Specific Question:   Specimen Source    Answer:   Urine, Clean Catch [76]   CBC    Baseline for heparin therapy IF NOT ALREADY DRAWN.  Notify MD if PLT < 100 K.    Standing Status:  Standing    Number of Occurrences:   1   Creatinine, serum    Baseline for heparin therapy IF NOT ALREADY DRAWN.    Standing Status:   Standing    Number of Occurrences:   1   CBC    Standing Status:   Standing    Number of Occurrences:   1   Comprehensive metabolic panel    Standing Status:   Standing    Number of Occurrences:   1   Magnesium    Standing Status:   Standing    Number of Occurrences:   1   Phosphorus    Standing Status:   Standing    Number of Occurrences:   1   Diet NPO time specified Except for: Sips with Meds, Ice Chips    Standing Status:   Standing    Number of Occurrences:   1    Order Specific Question:   Except for    Answer:   Sips with Meds    Order Specific Question:   Except for    Answer:   Ice Chips   Catherize if unable to void    Standing Status:   Standing    Number of Occurrences:   20   Maintain IV access    Standing Status:   Standing    Number of Occurrences:   1   Vital signs    Standing Status:   Standing    Number of Occurrences:   1   Notify physician (specify)    Standing Status:   Standing    Number of Occurrences:   20    Order Specific Question:   Notify Physician    Answer:   for pulse less than 55 or greater than 120    Order Specific Question:   Notify Physician    Answer:   for respiratory rate less than 12 or greater than 25    Order Specific Question:   Notify Physician    Answer:   for temperature greater than 100.5 F    Order Specific Question:   Notify Physician    Answer:   for urinary output less than 30 mL/hr for four hours    Order Specific Question:   Notify Physician    Answer:   for systolic BP less than 90 or greater than 0000000, diastolic BP less than 60 or  greater than 100    Order Specific Question:   Notify Physician    Answer:   for new hypoxia w/ oxygen saturations < 88%   Daily weights    Standing Status:   Standing    Number of Occurrences:   1   Intake and Output    Standing Status:   Standing    Number of Occurrences:   1   Initiate Oral Care Protocol    Standing Status:   Standing    Number of Occurrences:   1   Initiate Carrier Fluid Protocol    Standing Status:   Standing    Number of Occurrences:   1   RN may order General Admission PRN Orders utilizing "General Admission PRN medications" (through manage orders) for the following patient needs: allergy symptoms (Claritin), cold sores (Carmex), cough (Robitussin DM), eye irritation (Liquifilm Tears), hemorrhoids (Tucks), indigestion (Maalox), minor skin irritation (Hydrocortisone Cream), muscle pain (Ben Gay), nose irritation (saline nasal spray) and sore throat (Chloraseptic spray).    Standing Status:   Standing    Number of Occurrences:  99999   Cardiac Monitoring Continuous x 48 hours Indications for use: Other; Other indications for use: severe hypomagnesiemia.    Standing Status:   Standing    Number of Occurrences:   1    Order Specific Question:   Indications for use:    Answer:   Other    Order Specific Question:   Other indications for use:    Answer:   severe hypomagnesiemia.   Bed rest    Standing Status:   Standing    Number of Occurrences:   1   Swallow screen    Standing Status:   Standing    Number of Occurrences:   1   Do not attempt resuscitation (DNR)    Standing Status:   Standing    Number of Occurrences:   1    Order Specific Question:   If patient has no pulse and is not breathing    Answer:   Do Not Attempt Resuscitation    Order Specific Question:   If patient has a pulse and/or is breathing: Medical Treatment Goals    Answer:   LIMITED ADDITIONAL INTERVENTIONS: Use medication/IV fluids and cardiac monitoring as indicated; Do not use intubation  or mechanical ventilation (DNI), also provide comfort medications.  Transfer to Progressive/Stepdown as indicated, avoid Intensive Care.    Order Specific Question:   Consent:    Answer:   Discussion documented in EHR or advanced directives reviewed   Consult to hospitalist    Standing Status:   Standing    Number of Occurrences:   1    Order Specific Question:   Place call to:    Answer:   [id number]    Order Specific Question:   Reason for Consult    Answer:   Admit   Pulse oximetry check with vital signs    Standing Status:   Standing    Number of Occurrences:   1   Oxygen therapy Mode or (Route): Nasal cannula; Liters Per Minute: 2; Keep 02 saturation: greater than 92 %    Standing Status:   Standing    Number of Occurrences:   20    Order Specific Question:   Mode or (Route)    Answer:   Nasal cannula    Order Specific Question:   Liters Per Minute    Answer:   2    Order Specific Question:   Keep 02 saturation    Answer:   greater than 92 %   EKG 12-Lead    Standing Status:   Standing    Number of Occurrences:   1   Place in observation (patient's expected length of stay will be less than 2 midnights)    Standing Status:   Standing    Number of Occurrences:   1    Order Specific Question:   Hospital Area    Answer:   [facility] [[id number]]    Order Specific Question:   Level of Care    Answer:   Telemetry Medical [104]    Order Specific Question:   Covid Evaluation    Answer:   Asymptomatic - no recent exposure (last 10 days) testing not required    Order Specific Question:   Diagnosis    Answer:   Nausea vomiting and diarrhea [id number]    Order Specific Question:   Admitting Physician    Answer:   Cherylann Ratel    Order Specific Question:   Attending Physician  Answer:   Para Skeans [[id number]]   Aspiration precautions    Standing Status:   Standing    Number of Occurrences:   1   Fall precautions    Standing Status:   Standing    Number  of Occurrences:   1    Meds ordered this encounter  Medications   ondansetron (ZOFRAN) injection 4 mg   sodium chloride 0.9 % bolus 1,000 mL   metroNIDAZOLE (FLAGYL) IVPB 500 mg    Order Specific Question:   Antibiotic Indication:    Answer:   Intra-abdominal Infection   cefTRIAXone (ROCEPHIN) 1 g in sodium chloride 0.9 % 100 mL IVPB    Order Specific Question:   Antibiotic Indication:    Answer:   Intra-abdominal   sodium bicarbonate tablet 650 mg   escitalopram (LEXAPRO) tablet 5 mg   heparin injection 5,000 Units   sodium chloride flush (NS) 0.9 % injection 3 mL   0.9 %  sodium chloride infusion   OR Linked Order Group    acetaminophen (TYLENOL) tablet 650 mg    acetaminophen (TYLENOL) suppository 650 mg     Admission Imaging : CT ABDOMEN PELVIS WO CONTRAST  Result Date: 03/16/2023 CLINICAL DATA:  Abdominal pain, sepsis EXAM: CT ABDOMEN AND PELVIS WITHOUT CONTRAST TECHNIQUE: Multidetector CT imaging of the abdomen and pelvis was performed following the standard protocol without IV contrast. RADIATION DOSE REDUCTION: This exam was performed according to the departmental dose-optimization program which includes automated exposure control, adjustment of the mA and/or kV according to patient size and/or use of iterative reconstruction technique. COMPARISON:  None Available. FINDINGS: Lower chest: Abnormal paraspinal soft tissue at the inferior chest, including a 15 x 20 mm LEFT paraspinal mass, 16 x 10 mm RIGHT paraspinal mass, and a 26 x 18 mm RIGHT paraspinal mass. Additional nodular pleural thickening extends along the posterior pleural surface in the RIGHT chest. No intrapulmonary mass or nodule. Enlargement of cardiac chambers. Low-attenuation of circulating blood question anemia. Hepatobiliary: Large calculi within gallbladder. Questionable gallbladder wall thickening. Small focus of gas within the gallbladder versus gallbladder wall. Question mild intrahepatic biliary dilatation. Mass  RIGHT lobe liver 13 x 12 mm image 21 likely small cyst. No additional focal hepatic abnormalities. Pancreas: Atrophic pancreas without mass Spleen: Splenomegaly, spleen measuring 13.1 x 4.5 x 17.2 cm (volume = 530 cm^3). No focal mass. Adrenals/Urinary Tract: Adrenal glands unremarkable. Multiple calcifications in the kidneys, many renal vascular, some representing potentially small nonobstructing calculi. Small cyst inferior pole LEFT kidney 11 mm diameter; no follow-up imaging recommended. No hydronephrosis, hydroureter, or ureteral calcification. Bladder unremarkable. Stomach/Bowel: Normal appendix. Scattered colonic diverticulosis without evidence of diverticulitis. Stomach and bowel loops otherwise unremarkable for technique. Vascular/Lymphatic: Extensive atherosclerotic calcifications. Aorta normal caliber. No adenopathy. Reproductive: Atrophic uterus and LEFT ovary. Cyst RIGHT ovary 21 x 20 mm; this is a simple appearing cyst and no follow-up imaging is recommended. Other: No free air or free fluid. No hernia or inflammatory process. Musculoskeletal: Osseous demineralization. Old healed fractures of LEFT superior and inferior pubic rami. Marked compression deformity of L5 vertebral body question minimally progressive since radiographs of 02/11/2023 superior endplate concavity L3 age indeterminate. IMPRESSION: Cholelithiasis with questionable gallbladder wall thickening and question mild intrahepatic biliary dilatation as well as a single focus of gas within the gallbladder or gallbladder wall; further evaluation by ultrasound recommended. Splenomegaly of uncertain chronicity. Colonic diverticulosis without evidence of diverticulitis. Abnormal paraspinal soft tissue nodules and thickening at the inferior chest as above; these  are nonspecific, could represent pleural-based disease or tumor, paraspinal masses such as neural/ nerve sheath tumors, extramedullary hematopoiesis, and other etiologies; no prior  imaging to assess chronicity and only assessed at the inferior chest, recommend CT chest assessment Marked compression deformity of L5 vertebral body with sclerosis question minimally progressive since 02/11/2023. Extensive atherosclerotic calcifications. Aortic Atherosclerosis (ICD10-I70.0). Electronically Signed   By: Lavonia Dana M.D.   On: 03/16/2023 16:14   DG Chest Portable 1 View Result Date: 03/16/2023 CLINICAL DATA:  Abdominal pain EXAM: PORTABLE CHEST 1 VIEW COMPARISON:  X-ray 01/28/2023 FINDINGS: Diffuse interstitial changes, unchanged from prior and likely chronic. No consolidation, pneumothorax or effusion. Normal cardiopericardial silhouette. Overlapping cardiac leads. Osteopenia with degenerative changes. Fixation hardware along the proximal right humerus. IMPRESSION: Hyperinflation with likely chronic interstitial changes. No effusion or consolidation. Electronically Signed   By: Jill Side M.D.   On: 03/16/2023 15:51   US Abdomen Limited RUQ (LIVER/GB) Result Date: 03/16/2023 CLINICAL DATA:  Right upper quadrant pain EXAM: ULTRASOUND ABDOMEN LIMITED RIGHT UPPER QUADRANT COMPARISON:  None Available.  FINDINGS:  Gallbladder: Multiple calculi with distal shadowing in the gallbladder. No pericholecystic fluid. Sonographer describes no sonographic Murphy's sign. In the gallbladder limiting distal evaluation Common bile duct: Diameter: 2 mm. No intrahepatic biliary ductal dilatation identified. Liver: 1.1 cm simple appearing cyst in the right lobe, previously 1.3 cm on 12/11/2020. No new lesion identified. Within normal limits in parenchymal echogenicity. Portal vein is patent on color Doppler imaging with normal direction of blood flow towards the liver. Other: None. IMPRESSION: Cholelithiasis. Electronically Signed   By: Lucrezia Europe M.D.   On: 03/16/2023 15:23    Physical Examination: Vitals:   03/16/23 2030 03/16/23 2100 03/16/23 2130 03/16/23 2154  BP: (!) 105/46 (!) 101/53 (!) 117/52  102/62  Pulse: 87 81 84 81  Temp:  98 F (36.7 C)  98 F (36.7 C)  Resp: 17 (!) 21 16 16   Height:      Weight:      SpO2: 97% 99% 99% 100%  TempSrc:  Oral  Oral  BMI (Calculated):       Physical Exam Vitals and nursing note reviewed.  Constitutional:      General: She is not in acute distress.    Appearance: She is cachectic. She is not ill-appearing, toxic-appearing or diaphoretic.  HENT:     Head: Normocephalic and atraumatic.     Right Ear: Hearing and external ear normal.     Left Ear: Hearing and external ear normal.     Nose: Nose normal. No nasal deformity.     Mouth/Throat:     Lips: Pink.     Mouth: Mucous membranes are moist.     Tongue: No lesions.     Pharynx: Oropharynx is clear.  Eyes:     Extraocular Movements: Extraocular movements intact.  Cardiovascular:     Rate and Rhythm: Normal rate and regular rhythm.     Heart sounds: Normal heart sounds.  Pulmonary:     Effort: Pulmonary effort is normal.     Breath sounds: Normal breath sounds.  Abdominal:     General: Bowel sounds are normal. There is no distension.     Palpations: Abdomen is soft. There is no mass.     Tenderness: There is no abdominal tenderness. There is no guarding.     Hernia: No hernia is present.  Musculoskeletal:     Right lower leg: No edema.     Left lower leg: No  edema.  Skin:    General: Skin is warm.  Neurological:     General: No focal deficit present.     Mental Status: She is alert and oriented to person, place, and time.     Cranial Nerves: Cranial nerves 2-12 are intact.     Motor: Motor function is intact.  Psychiatric:        Attention and Perception: Attention normal.        Mood and Affect: Mood normal.        Speech: Speech normal.        Behavior: Behavior normal. Behavior is cooperative.        Cognition and Memory: Cognition normal.     Assessment and Plan: * Nausea vomiting and diarrhea 2/2 cholelithiasis. ? If pt had a CBD stone that she passed.  Pt  has BCD dilation on CT.  IV PPI. Gen surg  Consult order and am message sent.  Pain control with tylenol.      AKI (acute kidney injury) ([place]) in the setting of stage IIIb/IV chronic kidney disease Lab Results  Component Value Date   CREATININE 1.82 (H) 03/16/2023   CREATININE 1.56 (H) 01/30/2023   CREATININE 1.47 (H) 01/29/2023  Mild worsening in setting of hypovolemia and n/v and diarrhea. Cont with gentle MIVF hydration.     Choledocholithiasis with chronic cholecystitis- not amenable to surgery due to age Pt is not a good surgical candidate due to age.  ? If IR is needed.  Monitor clinically .   Protein-calorie malnutrition, severe Swallow test.  Nutritionist consult.  Npo except for meds overnight.   Electrolyte abnormality 2/2 N/V/D Monitor and replace.      DVT prophylaxis:  Heparin.  Code Status:  DNR.  Family Communication:  [name]) [phone]   Disposition Plan:  Home.   Consults called:  General surgery: Dr.Cintron.   Admission status: Inpatient.    Unit/ Expected LOS: Med tel/ 2 days    Para Skeans MD [facility]  6 PM- 2 AM. Please contact me via secure Chat 6 PM-2 AM. [phone]( Pager ) To contact the [facility] Attending or Consulting provider [place] or covering provider during after hours [place], for this patient.   Check the care team in [facility] and look for a) attending/consulting [facility] provider listed and b) the [facility] team listed Log into [email or web address] and use [facility] [place]'s universal password to access. If you do not have the password, please contact the hospital operator. Locate the [facility] provider you are looking for under [facility] and page to a number that you can be directly reached. If you still have difficulty reaching the provider, please page the [facility] (Director on Call) for the Hospitalists listed on amion for assistance. [email or web address] 03/16/2023, 10:44 PM

## 2023-03-16 NOTE — ED Provider Notes (Signed)
Case received in signout from Dr. Cheri Fowler pending follow-up imaging.  Right upper quadrant ultrasound was equivocal but given patient's pain age and risk factors CT imaging ordered.  Given questionable finding in the right upper quadrant discussed case in consultation with Dr. Peyton Najjar of surgery.  On my reexamination she is pain-free.  She would be exceedingly high risk for operative intervention.  Given her presentation and discussion with family plan will be admission for IV antibiotics and observation possible percutaneous cholecystostomy if she were to worsen.   Merlyn Lot, MD 03/16/23 Drema Halon

## 2023-03-16 NOTE — Assessment & Plan Note (Signed)
Pt is not a good surgical candidate due to age.  ? If IR is needed.  Monitor clinically .

## 2023-03-17 ENCOUNTER — Encounter: Payer: Self-pay | Admitting: Internal Medicine

## 2023-03-17 DIAGNOSIS — Z79899 Other long term (current) drug therapy: Secondary | ICD-10-CM | POA: Diagnosis not present

## 2023-03-17 DIAGNOSIS — K8064 Calculus of gallbladder and bile duct with chronic cholecystitis without obstruction: Secondary | ICD-10-CM | POA: Diagnosis present

## 2023-03-17 DIAGNOSIS — N184 Chronic kidney disease, stage 4 (severe): Secondary | ICD-10-CM | POA: Diagnosis present

## 2023-03-17 DIAGNOSIS — K5730 Diverticulosis of large intestine without perforation or abscess without bleeding: Secondary | ICD-10-CM | POA: Diagnosis present

## 2023-03-17 DIAGNOSIS — E43 Unspecified severe protein-calorie malnutrition: Secondary | ICD-10-CM | POA: Diagnosis present

## 2023-03-17 DIAGNOSIS — R197 Diarrhea, unspecified: Secondary | ICD-10-CM | POA: Diagnosis not present

## 2023-03-17 DIAGNOSIS — N179 Acute kidney failure, unspecified: Secondary | ICD-10-CM | POA: Diagnosis present

## 2023-03-17 DIAGNOSIS — R112 Nausea with vomiting, unspecified: Secondary | ICD-10-CM | POA: Diagnosis not present

## 2023-03-17 DIAGNOSIS — E878 Other disorders of electrolyte and fluid balance, not elsewhere classified: Secondary | ICD-10-CM | POA: Diagnosis present

## 2023-03-17 DIAGNOSIS — K828 Other specified diseases of gallbladder: Secondary | ICD-10-CM | POA: Diagnosis present

## 2023-03-17 DIAGNOSIS — E861 Hypovolemia: Secondary | ICD-10-CM | POA: Diagnosis present

## 2023-03-17 DIAGNOSIS — Z9841 Cataract extraction status, right eye: Secondary | ICD-10-CM | POA: Diagnosis not present

## 2023-03-17 DIAGNOSIS — R1013 Epigastric pain: Secondary | ICD-10-CM | POA: Diagnosis present

## 2023-03-17 DIAGNOSIS — J45909 Unspecified asthma, uncomplicated: Secondary | ICD-10-CM | POA: Diagnosis present

## 2023-03-17 DIAGNOSIS — Z9842 Cataract extraction status, left eye: Secondary | ICD-10-CM | POA: Diagnosis not present

## 2023-03-17 DIAGNOSIS — D631 Anemia in chronic kidney disease: Secondary | ICD-10-CM | POA: Diagnosis present

## 2023-03-17 DIAGNOSIS — R64 Cachexia: Secondary | ICD-10-CM | POA: Diagnosis present

## 2023-03-17 DIAGNOSIS — M4856XA Collapsed vertebra, not elsewhere classified, lumbar region, initial encounter for fracture: Secondary | ICD-10-CM | POA: Diagnosis present

## 2023-03-17 DIAGNOSIS — Z66 Do not resuscitate: Secondary | ICD-10-CM | POA: Diagnosis present

## 2023-03-17 DIAGNOSIS — Z681 Body mass index (BMI) 19 or less, adult: Secondary | ICD-10-CM | POA: Diagnosis not present

## 2023-03-17 LAB — PREPARE RBC (CROSSMATCH)

## 2023-03-17 LAB — CBC
HCT: 21 % — ABNORMAL LOW (ref 36.0–46.0)
Hemoglobin: 6.3 g/dL — ABNORMAL LOW (ref 12.0–15.0)
MCH: 28.1 pg (ref 26.0–34.0)
MCHC: 30 g/dL (ref 30.0–36.0)
MCV: 93.8 fL (ref 80.0–100.0)
Platelets: 151 10*3/uL (ref 150–400)
RBC: 2.24 MIL/uL — ABNORMAL LOW (ref 3.87–5.11)
RDW: 16.7 % — ABNORMAL HIGH (ref 11.5–15.5)
WBC: 14.5 10*3/uL — ABNORMAL HIGH (ref 4.0–10.5)
nRBC: 0.6 % — ABNORMAL HIGH (ref 0.0–0.2)

## 2023-03-17 LAB — COMPREHENSIVE METABOLIC PANEL
ALT: 13 U/L (ref 0–44)
AST: 24 U/L (ref 15–41)
Albumin: 3 g/dL — ABNORMAL LOW (ref 3.5–5.0)
Alkaline Phosphatase: 105 U/L (ref 38–126)
Anion gap: 7 (ref 5–15)
BUN: 47 mg/dL — ABNORMAL HIGH (ref 8–23)
CO2: 20 mmol/L — ABNORMAL LOW (ref 22–32)
Calcium: 7.6 mg/dL — ABNORMAL LOW (ref 8.9–10.3)
Chloride: 111 mmol/L (ref 98–111)
Creatinine, Ser: 1.81 mg/dL — ABNORMAL HIGH (ref 0.44–1.00)
GFR, Estimated: 25 mL/min — ABNORMAL LOW (ref >60)
Glucose, Bld: 80 mg/dL (ref 70–99)
Potassium: 4.1 mmol/L (ref 3.5–5.1)
Sodium: 138 mmol/L (ref 135–145)
Total Bilirubin: 0.9 mg/dL (ref 0.3–1.2)
Total Protein: 5.9 g/dL — ABNORMAL LOW (ref 6.5–8.1)

## 2023-03-17 LAB — PHOSPHORUS: Phosphorus: 4.9 mg/dL — ABNORMAL HIGH (ref 2.5–4.6)

## 2023-03-17 LAB — MAGNESIUM: Magnesium: 1.5 mg/dL — ABNORMAL LOW (ref 1.7–2.4)

## 2023-03-17 MED ORDER — MAGNESIUM SULFATE 2 GM/50ML IV SOLN
2.0000 g | Freq: Once | INTRAVENOUS | Status: AC
  Administered 2023-03-17: 2 g via INTRAVENOUS
  Filled 2023-03-17: qty 50

## 2023-03-17 MED ORDER — BOOST / RESOURCE BREEZE PO LIQD CUSTOM
1.0000 | Freq: Three times a day (TID) | ORAL | Status: DC
  Administered 2023-03-17 – 2023-03-18 (×2): 1 via ORAL

## 2023-03-17 MED ORDER — SODIUM CHLORIDE 0.9% IV SOLUTION: Freq: Once | INTRAVENOUS | Status: AC

## 2023-03-17 NOTE — Consult Note (Signed)
SURGICAL CONSULTATION NOTE   HISTORY OF PRESENT ILLNESS (HPI):  89 y.o. female presented to [facility] ED for evaluation of abdominal pain. Patient reports that actually the pain was mostly on the back.  Patient cannot remember any pain radiation.  Patient cannot identify any alleviating or aggravating factors.  Patient cannot recall how long she has been having pain but seems like it was yesterday.  Today the patient endorses that the pain has resolved.  Patient seems to be comfortable laying in bed.  She was smiling and saying that the pain has completely resolved.  Denies any nausea or vomiting.  At the ED she was found with leukocytosis.  There were normal bilirubin levels.  Ultrasound showed cholelithiasis without sign of cholecystitis.  CT scan shows distended gallbladder.  I personally evaluated the images.  Surgery is consulted by Dr. Posey Pronto in this context for evaluation and management of cholelithiasis.  PAST MEDICAL HISTORY (PMH):  Past Medical History:  Diagnosis Date   Anemia      PAST SURGICAL HISTORY ([place]):  Past Surgical History:  Procedure Laterality Date   CATARACT EXTRACTION, BILATERAL     SHOULDER SURGERY       MEDICATIONS:  Prior to Admission medications   Medication Sig Start Date End Date Taking? Authorizing Provider  escitalopram (LEXAPRO) 5 MG tablet Take 5 mg by mouth daily.    [provider]  Multiple Vitamin (MULTIVITAMIN WITH MINERALS) TABS tablet Take 1 tablet by mouth daily. 08/06/22   Annita Brod, MD  Polyethylene Glycol 400 (VISINE DRY EYE RELIEF OP) Place 1 drop into both eyes as needed (dry eyes).    [provider]  sodium bicarbonate 650 MG tablet Take 1 tablet (650 mg total) by mouth 2 (two) times daily. 01/30/23   Sharen Hones, MD     ALLERGIES:  Allergies  Allergen Reactions   Eggs-Apples-Oats [Alitraq] Other (See Comments)    Strawberries and Apples cause burning of lips   Food Other (See Comments)    Apples and  strawberries cause burning of lips   Strawberry (Diagnostic) Other (See Comments)    Strawberry & apple causes burning of the lips   Strawberry Extract Other (See Comments)    Strawberry & apple allergy----causes burning of the lips     SOCIAL HISTORY:  Social History   Socioeconomic History   Marital status: Widowed    Spouse name: Not on file   Number of children: Not on file   Years of education: Not on file   Highest education level: Not on file  Occupational History   Not on file  Tobacco Use   Smoking status: Never   Smokeless tobacco: Never  Vaping Use   Vaping Use: Never used  Substance and Sexual Activity   Alcohol use: Never   Drug use: Never   Sexual activity: Not Currently  Other Topics Concern   Not on file  Social History Narrative   Not on file   Social Determinants of Health   Financial Resource Strain: Not on file  Food Insecurity: No Food Insecurity (01/29/2023)   Hunger Vital Sign    Worried About Running Out of Food in the Last Year: Never true    Ran Out of Food in the Last Year: Never true  Transportation Needs: No Transportation Needs (01/29/2023)   PRAPARE - Hydrologist (Medical): No    Lack of Transportation (Non-Medical): No  Physical Activity: Not on file  Stress: Not on  file  Social Connections: Not on file  Intimate Partner Violence: Not At Risk (01/29/2023)   Humiliation, Afraid, Rape, and Kick questionnaire    Fear of Current or Ex-Partner: No    Emotionally Abused: No    Physically Abused: No    Sexually Abused: No      FAMILY HISTORY:  No family history on file.   REVIEW OF SYSTEMS:  Constitutional: denies weight loss, fever, chills, or sweats  Eyes: denies any other vision changes, history of eye injury  ENT: denies sore throat, hearing problems  Respiratory: denies shortness of breath, wheezing  Cardiovascular: denies chest pain, palpitations  Gastrointestinal: Positive abdominal  pain Genitourinary: denies burning with urination or urinary frequency Musculoskeletal: denies any other joint pains or cramps  Skin: denies any other rashes or skin discolorations  Neurological: denies any other headache, dizziness, weakness  Psychiatric: denies any other depression, anxiety   All other review of systems were negative   VITAL SIGNS:  Temp:  [97.3 F (36.3 C)-98.2 F (36.8 C)] 97.9 F (36.6 C) (03/28 0604) Pulse Rate:  [81-89] 81 (03/28 0604) Resp:  [12-25] 16 (03/28 0604) BP: (101-135)/(46-66) 106/54 (03/28 0604) SpO2:  [97 %-100 %] 100 % (03/28 0604) Weight:  [33.7 kg-41.8 kg] 33.7 kg (03/28 0500)     Height: 4\' 8"  (142.2 cm) Weight: 33.7 kg BMI (Calculated): 16.69   INTAKE/OUTPUT:  This shift: Total I/O In: 198.4 [I.V.:98.4; IV Piggyback:100] Out: -   Last 2 shifts: @IOLAST2SHIFTS @   PHYSICAL EXAM:  Constitutional:  -- Normal body habitus  -- Awake, alert, and oriented x3  Eyes:  -- Pupils equally round and reactive to light  -- No scleral icterus  Ear, nose, and throat:  -- No jugular venous distension  Pulmonary:  -- No crackles  -- Equal breath sounds bilaterally -- Breathing non-labored at rest Cardiovascular:  -- S1, S2 present  -- No pericardial rubs Gastrointestinal:  -- Abdomen soft, nontender, non-distended, no guarding or rebound tenderness -- No abdominal masses appreciated, pulsatile or otherwise  Musculoskeletal and Integumentary:  -- Wounds: None appreciated -- Extremities: B/L UE and LE FROM, hands and feet warm, no edema  Neurologic:  -- Motor function: intact and symmetric -- Sensation: intact and symmetric   Labs:     Latest Ref Rng & Units 03/17/2023    4:42 AM 03/16/2023   10:30 PM 03/16/2023    1:50 PM  CBC  WBC 4.0 - 10.5 K/uL 14.5  16.1  21.9   Hemoglobin 12.0 - 15.0 g/dL 6.3  6.7  8.8   Hematocrit 36.0 - 46.0 % 21.0  21.7  29.5   Platelets 150 - 400 K/uL 151  168  158       Latest Ref Rng & Units 03/17/2023     4:42 AM 03/16/2023   10:30 PM 03/16/2023    1:50 PM  CMP  Glucose 70 - 99 mg/dL 80   137   BUN 8 - 23 mg/dL 47   49   Creatinine 0.44 - 1.00 mg/dL 1.81  1.87  1.82   Sodium 135 - 145 mmol/L 138   136   Potassium 3.5 - 5.1 mmol/L 4.1   4.3   Chloride 98 - 111 mmol/L 111   106   CO2 22 - 32 mmol/L 20   19   Calcium 8.9 - 10.3 mg/dL 7.6   8.8   Total Protein 6.5 - 8.1 g/dL 5.9   7.4   Total Bilirubin 0.3 - 1.2  mg/dL 0.9   0.9   Alkaline Phos 38 - 126 U/L 105   128   AST 15 - 41 U/L 24   33   ALT 0 - 44 U/L 13   17     Imaging studies:  FINDINGS: Gallbladder:   Multiple calculi with distal shadowing in the gallbladder. No pericholecystic fluid. Sonographer describes no sonographic Murphy's sign. In the gallbladder limiting distal evaluation   Common bile duct:   Diameter: 2 mm. No intrahepatic biliary ductal dilatation identified.   Liver:   1.1 cm simple appearing cyst in the right lobe, previously 1.3 cm on 12/11/2020. No new lesion identified. Within normal limits in parenchymal echogenicity. Portal vein is patent on color Doppler imaging with normal direction of blood flow towards the liver.   Other: None.   IMPRESSION: Cholelithiasis.     Electronically Signed   By: Lucrezia Europe M.D.   On: 03/16/2023 15:23  Assessment/Plan:  89 y.o. female with cholelithiasis, complicated by pertinent comorbidities including asthma.  Cholelithiasis -As per history patient came with abdominal pain.  During my evaluation today patient does state the pain was mostly on her back -This morning the patient endorses that the pain has completely resolved.  She denies any abdominal pain. -Physical exam without tenderness on palpation of the abdomen -White blood cell count trending down.  This morning 14,000 which was 22,000 on admission. -I agree with medical management of biliary colic. -I will start liquid diet.  Diet can be advanced as tolerated -No surgical intervention  recommended  Anemia -No source of bleeding.  Patient denies any rectal bleeding.  No abdominal pain. -Transfuse PRBC as necessary.   Arnold Long, MD

## 2023-03-17 NOTE — Plan of Care (Signed)
  Problem: Education: Goal: Knowledge of General Education information will improve Description: Including pain rating scale, medication(s)/side effects and non-pharmacologic comfort measures Outcome: Progressing   Problem: Clinical Measurements: Goal: Respiratory complications will improve Outcome: Progressing   Problem: Clinical Measurements: Goal: Cardiovascular complication will be avoided Outcome: Progressing   Problem: Activity: Goal: Risk for activity intolerance will decrease Outcome: Progressing   Problem: Nutrition: Goal: Adequate nutrition will be maintained Outcome: Progressing   Problem: Coping: Goal: Level of anxiety will decrease Outcome: Progressing   Problem: Elimination: Goal: Will not experience complications related to bowel motility Outcome: Progressing Goal: Will not experience complications related to urinary retention Outcome: Progressing   Problem: Pain Managment: Goal: General experience of comfort will improve Outcome: Progressing   Problem: Safety: Goal: Ability to remain free from injury will improve Outcome: Progressing   Problem: Skin Integrity: Goal: Risk for impaired skin integrity will decrease Outcome: Progressing

## 2023-03-17 NOTE — Progress Notes (Signed)
Progress Note   Patient: Julia Kerr [id number] DOB: [birth year] DOA: 03/16/2023     0 DOS: the patient was seen and examined on 03/17/2023    Subjective:  Patient seen and examined at bedside this morning in the presence of patient's son She denied any ongoing bleeding Denied abdominal pain hematemesis melena or urinary complaints   Brief hospital course: Julia Kerr is an 89 y.o. female seen today with c/o abdominal pain and gall bladder flare up and pt has h/o gall bladder colic cholecystitis since long time 2013 and son wishes they would have had it surgically treated then.  Surgeon was called who evaluated patient and apparently patient did not endorse any abdominal pain.  She was actually seen smiling and in good spirits.  Given her age and medical condition surgeon recommends medical management with conservative management.   Assessment and Plan:  * Nausea vomiting and diarrhea 2/2 cholelithiasis. CT scan of the abdomen showed colonic diverticulosis, cholelithiasis with questionable gallbladder wall thickening, paraspinal soft tissue nodule which are nonspecific. Compression deformity of L5 vertebral body which has remained essentially stable Continue pantoprazole Surgeon on board we appreciate input Neurosurgeons recommendation reviewed and currently not planning any acute surgical intervention Pain control with tylenol.    Severe hypomagnesemia-continue repletion and monitoring  Acute on chronic anemia likely secondary to poor oral intake as well as chronic kidney disease Patient being transfused 2 units of packed RBC No evidence of ongoing bleeding Continue CBC  AKI (acute kidney injury) ([place]) in the setting of stage IIIb/IV chronic kidney disease Mild worsening in setting of hypovolemia and n/v and diarrhea. Cont with gentle MIVF hydration. Needed renal function closely     Choledocholithiasis with chronic cholecystitis- not amenable to surgery due to age Pt is  not a good surgical candidate due to age.  Surgeon consulted we appreciate input Monitor clinically .     Protein-calorie malnutrition, severe Swallow test.  Nutritionist consult.  Npo except for meds overnight.     Electrolyte abnormality 2/2 N/V/D Monitor and replace.     DVT prophylaxis:  Heparin.  Code Status:  DNR.  Family Communication:  Jiles Garter [phone]) [phone]   Data Reviewed: I have reviewed patient's Hb showing 6.3 and therefore being transfused packed RBC today, magnesium level of 1.5 undergoing repletion  Family Communication: Discussed with patient's son present at bedside  Disposition: Patient's status is inpatient given ongoing blood transfusion and severe life-threatening hypomagnesemia requiring repletion therapy   Planned Discharge Destination: Home Time spent: 40 minutes       Physical Exam: Constitutional:      General: She is not in acute distress.    Appearance: She appears cachectic HENT:     Head: Normocephalic and atraumatic.     Mouth/Throat:  Eyes:     Extraocular Movements: Extraocular movements intact.  Cardiovascular:     Rate and Rhythm: Normal rate and regular rhythm.  Pulmonary:     Effort: Pulmonary effort is normal.     Breath sounds: Normal breath sounds.  Abdominal:     General: Bowel sounds are normal. There is no distension.  Musculoskeletal:     Right lower leg: No edema.      Vitals:   03/16/23 2154 03/17/23 0500 03/17/23 0604 03/17/23 0822  BP: 102/62  (!) 106/54 (!) 117/52  Pulse: 81  81 79  Resp: 16  16 16   Temp: 98 F (36.7 C)  97.9 F (36.6 C) 98.1 F (36.7 C)  TempSrc: Oral  Oral  SpO2: 100%  100% 100%  Weight:  33.7 kg    Height:         Author: Verline Lema, MD 03/17/2023 3:05 PM  For on call review [email or web address].

## 2023-03-18 DIAGNOSIS — R197 Diarrhea, unspecified: Secondary | ICD-10-CM | POA: Diagnosis not present

## 2023-03-18 DIAGNOSIS — R112 Nausea with vomiting, unspecified: Secondary | ICD-10-CM | POA: Diagnosis not present

## 2023-03-18 LAB — CBC WITH DIFFERENTIAL/PLATELET
Abs Immature Granulocytes: 1.3 10*3/uL — ABNORMAL HIGH (ref 0.00–0.07)
Basophils Absolute: 0 10*3/uL (ref 0.0–0.1)
Basophils Relative: 0 %
Eosinophils Absolute: 0.1 10*3/uL (ref 0.0–0.5)
Eosinophils Relative: 1 %
HCT: 34.2 % — ABNORMAL LOW (ref 36.0–46.0)
Hemoglobin: 11.2 g/dL — ABNORMAL LOW (ref 12.0–15.0)
Lymphocytes Relative: 14 %
Lymphs Abs: 2 10*3/uL (ref 0.7–4.0)
MCH: 29.9 pg (ref 26.0–34.0)
MCHC: 32.7 g/dL (ref 30.0–36.0)
MCV: 91.2 fL (ref 80.0–100.0)
Metamyelocytes Relative: 3 %
Monocytes Absolute: 3.1 10*3/uL — ABNORMAL HIGH (ref 0.1–1.0)
Monocytes Relative: 22 %
Myelocytes: 5 %
Neutro Abs: 6.4 10*3/uL (ref 1.7–7.7)
Neutrophils Relative %: 45 %
Other: 9 %
Platelets: 158 10*3/uL (ref 150–400)
Promyelocytes Relative: 1 %
RBC: 3.75 MIL/uL — ABNORMAL LOW (ref 3.87–5.11)
RDW: 15.7 % — ABNORMAL HIGH (ref 11.5–15.5)
Smear Review: NORMAL
WBC: 14.2 10*3/uL — ABNORMAL HIGH (ref 4.0–10.5)
nRBC: 0.8 % — ABNORMAL HIGH (ref 0.0–0.2)

## 2023-03-18 LAB — BASIC METABOLIC PANEL
Anion gap: 10 (ref 5–15)
BUN: 39 mg/dL — ABNORMAL HIGH (ref 8–23)
CO2: 14 mmol/L — ABNORMAL LOW (ref 22–32)
Calcium: 8 mg/dL — ABNORMAL LOW (ref 8.9–10.3)
Chloride: 114 mmol/L — ABNORMAL HIGH (ref 98–111)
Creatinine, Ser: 1.71 mg/dL — ABNORMAL HIGH (ref 0.44–1.00)
GFR, Estimated: 27 mL/min — ABNORMAL LOW (ref >60)
Glucose, Bld: 82 mg/dL (ref 70–99)
Potassium: 4.2 mmol/L (ref 3.5–5.1)
Sodium: 138 mmol/L (ref 135–145)

## 2023-03-18 LAB — PATHOLOGIST SMEAR REVIEW

## 2023-03-18 MED ORDER — AMOXICILLIN-POT CLAVULANATE 500-125 MG PO TABS
1.0000 | ORAL_TABLET | Freq: Two times a day (BID) | ORAL | Status: DC
  Filled 2023-03-18: qty 1

## 2023-03-18 MED ORDER — AMOXICILLIN-POT CLAVULANATE 500-125 MG PO TABS: 1.0000 | ORAL_TABLET | Freq: Two times a day (BID) | ORAL | 0 refills | Status: AC

## 2023-03-18 NOTE — Discharge Summary (Signed)
  Physician Discharge Summary   Patient: Julia Kerr MRN: [id number] DOB: [birth year]  Admit date:     03/16/2023  Discharge date: 03/18/23  Discharge Physician: Verline Lema   PCP: System, Provider Not In    Discharge Diagnoses:  Nausea vomiting and diarrhea2/2 cholelithiasis. Severe hypomagnesemia-continue repletion and monitoring AKI (acute kidney injury) ([place]) in the setting of stage IIIb/IV chronic kidney disease Choledocholithiasis with chronic cholecystitis- not amenable to surgery due to age Protein-calorie malnutrition, severe Electrolyte abnormality    Hospital Course:  [place] Gonce is an 89 y.o. female seen today with c/o abdominal pain and gall bladder flare up and pt has h/o gall bladder colic cholecystitis since long time 2013 and son wishes they would have had it surgically treated then.  Surgeon was called who evaluated patient and apparently patient did not endorse any abdominal pain.  She was actually seen smiling and in good spirits.  Given her age and medical condition surgeon recommends medical management with conservative management.  She also underwent blood transfusion given anemia of 6.3.  Has been cleared by surgeon for discharge today does not need any surgical follow-up.        Consultants: Surgery Procedures performed: None Disposition: Home Diet recommendation:  Discharge Diet Orders (From admission, onward)     Start     Ordered   03/18/23 0000  Diet - low sodium heart healthy        03/18/23 1226           Cardiac diet DISCHARGE MEDICATION: Allergies as of 03/18/2023       Reactions   Eggs-apples-oats [alitraq] Other (See Comments)   Strawberries and Apples cause burning of lips   Food Other (See Comments)   Apples and strawberries cause burning of lips   Strawberry (diagnostic) Other (See Comments)   Strawberry & apple causes burning of the lips   Strawberry Extract Other (See Comments)   Strawberry & apple allergy----causes  burning of the lips        Medication List     TAKE these medications    amoxicillin-clavulanate 500-125 MG tablet Commonly known as: AUGMENTIN Take 1 tablet by mouth 2 (two) times daily for 5 days.   escitalopram 5 MG tablet Commonly known as: LEXAPRO Take 5 mg by mouth daily.   multivitamin with minerals Tabs tablet Take 1 tablet by mouth daily.   sodium bicarbonate 650 MG tablet Take 1 tablet (650 mg total) by mouth 2 (two) times daily.   VISINE DRY EYE RELIEF OP Place 1 drop into both eyes as needed (dry eyes).        Discharge Exam: Filed Weights   03/16/23 1253 03/17/23 0500  Weight: 41.8 kg 33.7 kg   Constitutional:      General: She is not in acute distress.    Appearance: She appears cachectic HENT:     Head: Normocephalic and atraumatic.     Mouth/Throat:  Eyes:     Extraocular Movements: Extraocular movements intact.  Cardiovascular:     Rate and Rhythm: Normal rate and regular rhythm.  Pulmonary:     Effort: Pulmonary effort is normal.     Breath sounds: Normal breath sounds.  Abdominal:     General: Bowel sounds are normal. There is no distension.  Musculoskeletal:     Right lower leg: No edema.   Condition at discharge: good  Discharge time spent: greater than 30 minutes.  Signed: Verline Lema, MD [facility] 03/18/2023

## 2023-03-19 LAB — URINE CULTURE: Culture: <10000 — AB

## 2023-03-20 LAB — BPAM RBC
Blood Product Expiration Date: 202404162359
Blood Product Expiration Date: 202404182359
Blood Product Expiration Date: 202404182359
Blood Product Expiration Date: 202404182359
ISSUE DATE / TIME: 202403281529
ISSUE DATE / TIME: 202403282207
Unit Type and Rh: 6200
Unit Type and Rh: 6200
Unit Type and Rh: 6200
Unit Type and Rh: 6200

## 2023-03-20 LAB — TYPE AND SCREEN
ABO/RH(D): A POS
Antibody Screen: POSITIVE
Unit division: 0
Unit division: 0
Unit division: 0
Unit division: 0

## 2023-03-21 LAB — CULTURE, BLOOD (ROUTINE X 2)
Culture: NO GROWTH
Culture: NO GROWTH
Special Requests: ADEQUATE

## 2023-03-30 ENCOUNTER — Encounter: Payer: Self-pay | Admitting: Intensive Care

## 2023-03-30 ENCOUNTER — Other Ambulatory Visit: Payer: Self-pay

## 2023-03-30 ENCOUNTER — Emergency Department: Payer: Medicare (Managed Care)

## 2023-03-30 ENCOUNTER — Emergency Department
Admission: EM | Admit: 2023-03-30 | Discharge: 2023-03-30 | Disposition: A | Discharge status: Home / Self Care | Payer: Medicare (Managed Care) | Attending: Emergency Medicine | Admitting: Emergency Medicine

## 2023-03-30 DIAGNOSIS — N189 Chronic kidney disease, unspecified: Secondary | ICD-10-CM | POA: Diagnosis not present

## 2023-03-30 DIAGNOSIS — D72829 Elevated white blood cell count, unspecified: Secondary | ICD-10-CM | POA: Insufficient documentation

## 2023-03-30 DIAGNOSIS — N390 Urinary tract infection, site not specified: Secondary | ICD-10-CM | POA: Insufficient documentation

## 2023-03-30 DIAGNOSIS — R319 Hematuria, unspecified: Secondary | ICD-10-CM | POA: Diagnosis present

## 2023-03-30 DIAGNOSIS — N200 Calculus of kidney: Secondary | ICD-10-CM

## 2023-03-30 DIAGNOSIS — N3001 Acute cystitis with hematuria: Secondary | ICD-10-CM

## 2023-03-30 HISTORY — DX: Chronic kidney disease, unspecified: N18.9

## 2023-03-30 LAB — URINALYSIS, ROUTINE W REFLEX MICROSCOPIC
Bacteria, UA: NONE SEEN
RBC / HPF: >50 RBC/hpf (ref 0–5)
Specific Gravity, Urine: 1.012 (ref 1.005–1.030)
WBC, UA: >50 WBC/hpf (ref 0–5)

## 2023-03-30 LAB — COMPREHENSIVE METABOLIC PANEL
ALT: 15 U/L (ref 0–44)
AST: 28 U/L (ref 15–41)
Albumin: 3.3 g/dL — ABNORMAL LOW (ref 3.5–5.0)
Alkaline Phosphatase: 109 U/L (ref 38–126)
Anion gap: 9 (ref 5–15)
BUN: 55 mg/dL — ABNORMAL HIGH (ref 8–23)
CO2: 16 mmol/L — ABNORMAL LOW (ref 22–32)
Calcium: 8.4 mg/dL — ABNORMAL LOW (ref 8.9–10.3)
Chloride: 106 mmol/L (ref 98–111)
Creatinine, Ser: 2.22 mg/dL — ABNORMAL HIGH (ref 0.44–1.00)
GFR, Estimated: 20 mL/min — ABNORMAL LOW (ref >60)
Glucose, Bld: 88 mg/dL (ref 70–99)
Potassium: 4.5 mmol/L (ref 3.5–5.1)
Sodium: 131 mmol/L — ABNORMAL LOW (ref 135–145)
Total Bilirubin: 0.9 mg/dL (ref 0.3–1.2)
Total Protein: 7 g/dL (ref 6.5–8.1)

## 2023-03-30 LAB — CBC WITH DIFFERENTIAL/PLATELET
Abs Immature Granulocytes: 1.68 10*3/uL — ABNORMAL HIGH (ref 0.00–0.07)
Basophils Absolute: 0 10*3/uL (ref 0.0–0.1)
Basophils Relative: 0 %
Eosinophils Absolute: 0.1 10*3/uL (ref 0.0–0.5)
Eosinophils Relative: 0 %
HCT: 32.2 % — ABNORMAL LOW (ref 36.0–46.0)
Hemoglobin: 10.1 g/dL — ABNORMAL LOW (ref 12.0–15.0)
Immature Granulocytes: 8 %
Lymphocytes Relative: 29 %
Lymphs Abs: 5.9 10*3/uL — ABNORMAL HIGH (ref 0.7–4.0)
MCH: 29.4 pg (ref 26.0–34.0)
MCHC: 31.4 g/dL (ref 30.0–36.0)
MCV: 93.6 fL (ref 80.0–100.0)
Monocytes Absolute: 3.5 10*3/uL — ABNORMAL HIGH (ref 0.1–1.0)
Monocytes Relative: 17 %
Neutro Abs: 9.3 10*3/uL — ABNORMAL HIGH (ref 1.7–7.7)
Neutrophils Relative %: 46 %
Platelets: 196 10*3/uL (ref 150–400)
RBC: 3.44 MIL/uL — ABNORMAL LOW (ref 3.87–5.11)
RDW: 16.1 % — ABNORMAL HIGH (ref 11.5–15.5)
Smear Review: NORMAL
WBC: 20.5 10*3/uL — ABNORMAL HIGH (ref 4.0–10.5)
nRBC: 0.1 % (ref 0.0–0.2)

## 2023-03-30 MED ORDER — SODIUM CHLORIDE 0.9 % IV SOLN
1.0000 g | Freq: Once | INTRAVENOUS | Status: AC
  Administered 2023-03-30: 1 g via INTRAVENOUS
  Filled 2023-03-30: qty 10

## 2023-03-30 MED ORDER — CEPHALEXIN 500 MG PO CAPS: 500.0000 mg | ORAL_CAPSULE | Freq: Two times a day (BID) | ORAL | 0 refills | Status: DC

## 2023-03-30 NOTE — ED Notes (Signed)
Writer spoke with son on the phone who stated he is on his way back to the hospital. Writer explained to son that patient needs an IV and IV ATB. Son explained this to patient over the phone and then told writer she said she understood. Writer then utilized interpreter to tell patient that I would be starting an IV. Pt then did not understand why she needed an IV and needed ATB. Writer attempted 3 additional times to explain to patient that her WBC is elevated which typically indicates an infection and that with the blood in her urine it's highly likely she has a urinary tract infection and that the ATB would treat the infection. After 3 unsuccessful attempts to explain this to the patient, writer opted to wait until son arrives back in ED to start IV. Paduchowski, MD notified.

## 2023-03-30 NOTE — Discharge Instructions (Addendum)
Please call the number provided for urology to arrange a follow-up appointment soon as possible.  Please take your antibiotic as prescribed for its entire course.  Return to the emergency department for any worsening bleeding or any other symptom personally concerning to yourself.

## 2023-03-30 NOTE — ED Provider Notes (Signed)
[facility] Provider Note    Event Date/Time   First MD Initiated Contact with Patient 03/30/23 1911     (approximate)  History   Chief Complaint: Hematuria  HPI  Julia Kerr is a 89 y.o. female with a past medical history of anemia, CKD, presents to the emergency department for hematuria.  According to the son since earlier today they have noticed blood within the urine.  Patient denies any pain when she urinates denies any abdominal pain.  No fever.  States patient has never had blood in her urine previously.  Physical Exam   Triage Vital Signs: ED Triage Vitals  Enc Vitals Group     BP 03/30/23 1826 (!) 138/48     Pulse Rate 03/30/23 1826 81     Resp 03/30/23 1826 16     Temp 03/30/23 1824 98.2 F (36.8 C)     Temp Source 03/30/23 1824 Oral     SpO2 03/30/23 1826 100 %     Weight 03/30/23 1827 80 lb (36.3 kg)     Height 03/30/23 1827 4\' 8"  (1.422 m)     Head Circumference --      Peak Flow --      Pain Score 03/30/23 1827 0     Pain Loc --      Pain Edu? --      Excl. in GC? --     Most recent vital signs: Vitals:   03/30/23 1824 03/30/23 1826  BP:  (!) 138/48  Pulse:  81  Resp:  16  Temp: 98.2 F (36.8 C)   SpO2:  100%    General: Awake, no distress.  CV:  Good peripheral perfusion.  Regular rate and rhythm  Resp:  Normal effort.  Equal breath sounds bilaterally.  Abd:  No distention.  Soft, nontender.  No rebound or guarding.  ED Results / Procedures / Treatments    RADIOLOGY  I have reviewed and interpreted the CT images.  No significant abnormality seen on my evaluation. Radiology is read the CT scan as bilateral nonobstructing renal calculi however one of the calculi in the left renal pelvis may cause intermittent obstruction.  Possible thrombus in the left renal pelvis as well.   MEDICATIONS ORDERED IN ED: Medications  cefTRIAXone (ROCEPHIN) 1 g in sodium chloride 0.9 % 100 mL IVPB (has no administration in time  range)     IMPRESSION / MDM / ASSESSMENT AND PLAN / ED COURSE  I reviewed the triage vital signs and the nursing notes.  Patient's presentation is most consistent with acute presentation with potential threat to life or bodily function.  Patient presents emergency department for hematuria.  No abdominal tenderness.  Given no history of hematuria previously we will check labs, urinalysis and obtain a CT renal scan to further evaluate.  Patient and son are agreeable to this plan of care.  Patient's CBC does show leukocytosis of 20,000 however this is largely unchanged from historical values.  Hemoglobin largely unchanged from 2 weeks ago.  Chemistry shows no significant findings besides chronic renal sufficiency not significantly changed from prior values.  Urinalysis does show greater than 50 red cells and white cells concerning for possible urinary tract infection or cystitis.  CT renal scan does show renal stones with a stone seen in the left renal pelvis with possible thrombus in left renal pelvis as well.  Given the patient's otherwise reassuring workup I believe we can discharge the patient home with antibiotics and  follow-up with urology for further recommendations given her advanced age.  FINAL CLINICAL IMPRESSION(S) / ED DIAGNOSES   Hematuria Urinary tract infection  Note:  This document was prepared using Dragon voice recognition software and may include unintentional dictation errors.   Minna Antis, MD 03/30/23 2133

## 2023-03-30 NOTE — ED Notes (Signed)
Writer went into room to insert IV and give patient ATB for her infection and utilized interpreter to talk to patient. Patient actively refusing to have an IV placed until son is back in the room. Writer is unaware of where patient's son is at this time as he did not notify staff prior to stepping away.

## 2023-03-30 NOTE — ED Triage Notes (Addendum)
Patient c/o blood in urine. Sent by PCP.  Was seen here around a month ago and gallbladder was inflamed and received a blood transfusion   Baseline ambulates with walker

## 2023-04-05 ENCOUNTER — Telehealth: Payer: Self-pay | Admitting: Hematology and Oncology

## 2023-04-05 NOTE — Progress Notes (Unsigned)
[facility] [phone]   [phone]  PROGRESS NOTE  Patient Care Team: System, Provider Not In as PCP - General  Hematological/Oncological History # Normocytic Anemia 08/04/2018: WBC 4.2, Hgb 9.4, MCV 89, Plt 264 05/05/2021: WBC 12.2, Hgb 8.9, MCV 94.9, Plt 229 09/23/2021: WBC 7.2, Hgb 6.6, MCV 101.9, Plt 173. Ferritin 1059, TIBC 245, Iron sat 27%. Retc 4.3% 09/24/2021: WBC 10.1, Hgb 8.9, MCV 92, Plt 186 09/30/2021: establish care with Dr. Leonides Schanz   Interval History:  Julia Kerr 89 y.o. female with medical history significant for normocytic anemia and axillary lymphadenopathy who presents for a follow up visit. The patient's last visit was on 09/29/2022. In the interim since the last visit she has had no major changes in her health and reportedly has continued to receive EPO shots with her nephrologist.  On exam today Mrs. Korpela ***  She reports no fevers, chills, sweats, nausea, vomiting or diarrhea.  A full 10 point ROS is listed below.  MEDICAL HISTORY:  Past Medical History:  Diagnosis Date   Anemia    Chronic kidney disease     SURGICAL HISTORY: Past Surgical History:  Procedure Laterality Date   CATARACT EXTRACTION, BILATERAL     SHOULDER SURGERY      SOCIAL HISTORY: Social History   Socioeconomic History   Marital status: Widowed    Spouse name: Not on file   Number of children: Not on file   Years of education: Not on file   Highest education level: Not on file  Occupational History   Not on file  Tobacco Use   Smoking status: Never   Smokeless tobacco: Never  Vaping Use   Vaping Use: Never used  Substance and Sexual Activity   Alcohol use: Never   Drug use: Never   Sexual activity: Not Currently  Other Topics Concern   Not on file  Social History Narrative   Not on file   Social Determinants of Health   Financial Resource Strain: Not on file  Food Insecurity: No Food Insecurity (01/29/2023)   Hunger Vital  Sign    Worried About Running Out of Food in the Last Year: Never true    Ran Out of Food in the Last Year: Never true  Transportation Needs: No Transportation Needs (01/29/2023)   PRAPARE - Administrator, Civil Service (Medical): No    Lack of Transportation (Non-Medical): No  Physical Activity: Not on file  Stress: Not on file  Social Connections: Not on file  Intimate Partner Violence: Not At Risk (01/29/2023)   Humiliation, Afraid, Rape, and Kick questionnaire    Fear of Current or Ex-Partner: No    Emotionally Abused: No    Physically Abused: No    Sexually Abused: No    FAMILY HISTORY: No family history on file.  ALLERGIES:  is allergic to eggs-apples-oats [alitraq], food, strawberry (diagnostic), and strawberry extract.  MEDICATIONS:  Current Outpatient Medications  Medication Sig Dispense Refill   cephALEXin (KEFLEX) 500 MG capsule Take 1 capsule (500 mg total) by mouth 2 (two) times daily. 14 capsule 0   escitalopram (LEXAPRO) 5 MG tablet Take 5 mg by mouth daily.     Multiple Vitamin (MULTIVITAMIN WITH MINERALS) TABS tablet Take 1 tablet by mouth daily. 30 tablet 1   Polyethylene Glycol 400 (VISINE DRY EYE RELIEF OP) Place 1 drop into both eyes as needed (dry eyes).     sodium bicarbonate 650 MG tablet Take 1 tablet (650 mg total)  by mouth 2 (two) times daily. 60 tablet 0   No current facility-administered medications for this visit.    REVIEW OF SYSTEMS:   Constitutional: ( - ) fevers, ( - )  chills , ( - ) night sweats Eyes: ( - ) blurriness of vision, ( - ) double vision, ( - ) watery eyes Ears, nose, mouth, throat, and face: ( - ) mucositis, ( - ) sore throat Respiratory: ( - ) cough, ( - ) dyspnea, ( - ) wheezes Cardiovascular: ( - ) palpitation, ( - ) chest discomfort, ( - ) lower extremity swelling Gastrointestinal:  ( - ) nausea, ( - ) heartburn, ( - ) change in bowel habits Skin: ( - ) abnormal skin rashes Lymphatics: ( - ) new lymphadenopathy,  ( - ) easy bruising Neurological: ( - ) numbness, ( - ) tingling, ( - ) new weaknesses Behavioral/Psych: ( - ) mood change, ( - ) new changes  All other systems were reviewed with the patient and are negative.  PHYSICAL EXAMINATION:  There were no vitals filed for this visit.  There were no vitals filed for this visit.   GENERAL: Well-appearing elderly Hispanic female, alert, no distress and comfortable SKIN: skin color, texture, turgor are normal, no rashes or significant lesions EYES: conjunctiva are pink and non-injected, sclera clear LYMPH:  no palpable lymphadenopathy in the cervical or inguinal LUNGS: clear to auscultation and percussion with normal breathing effort HEART: regular rate & rhythm and no murmurs and no lower extremity edema Musculoskeletal: no cyanosis of digits and no clubbing  PSYCH: alert & oriented x 3, fluent speech NEURO: no focal motor/sensory deficits  LABORATORY DATA:  I have reviewed the data as listed    Latest Ref Rng & Units 03/30/2023    6:37 PM 03/18/2023    6:47 AM 03/17/2023    4:42 AM  CBC  WBC 4.0 - 10.5 K/uL 20.5  14.2  14.5   Hemoglobin 12.0 - 15.0 g/dL 16.1  09.6  6.3   Hematocrit 36.0 - 46.0 % 32.2  34.2  21.0   Platelets 150 - 400 K/uL 196  158  151        Latest Ref Rng & Units 03/30/2023    6:37 PM 03/18/2023    6:47 AM 03/17/2023    4:42 AM  CMP  Glucose 70 - 99 mg/dL 88  82  80   BUN 8 - 23 mg/dL 55  39  47   Creatinine 0.44 - 1.00 mg/dL 0.45  4.09  8.11   Sodium 135 - 145 mmol/L 131  138  138   Potassium 3.5 - 5.1 mmol/L 4.5  4.2  4.1   Chloride 98 - 111 mmol/L 106  114  111   CO2 22 - 32 mmol/L 16  14  20    Calcium 8.9 - 10.3 mg/dL 8.4  8.0  7.6   Total Protein 6.5 - 8.1 g/dL 7.0   5.9   Total Bilirubin 0.3 - 1.2 mg/dL 0.9   0.9   Alkaline Phos 38 - 126 U/L 109   105   AST 15 - 41 U/L 28   24   ALT 0 - 44 U/L 15   13     Lab Results  Component Value Date   MPROTEIN Not Observed 09/30/2021    RADIOGRAPHIC  STUDIES: CT Renal Stone Study  Result Date: 03/30/2023 CLINICAL DATA:  Flank pain and hematuria, initial encounter EXAM: CT ABDOMEN AND PELVIS WITHOUT  CONTRAST TECHNIQUE: Multidetector CT imaging of the abdomen and pelvis was performed following the standard protocol without IV contrast. RADIATION DOSE REDUCTION: This exam was performed according to the departmental dose-optimization program which includes automated exposure control, adjustment of the mA and/or kV according to patient size and/or use of iterative reconstruction technique. COMPARISON:  03/16/2023 FINDINGS: Lower chest: Lung bases demonstrate some mild scarring. Persistent soft tissue density is noted adjacent to lower thoracic spine stable in appearance from the prior exam. Hepatobiliary: Multiple gallstones are noted. No wall thickening or pericholecystic fluid is noted. Liver shows a stable right hepatic cyst. This is stable from prior ultrasound. Pancreas: Unremarkable. No pancreatic ductal dilatation or surrounding inflammatory changes. Spleen: Prominent but stable in appearance. Adrenals/Urinary Tract: Adrenal glands are within normal limits. A few scattered small renal calculi are noted. No obstructive changes are seen. A small 2-3 mm stone is noted in the left renal pelvis. No definitive obstructive changes are seen. Slight increased density is seen within the left renal pelvis best noted on image number 26 of series 2 which may be related to the known hematuria and mild thrombus. More distal ureters are within normal limits. Bladder is well distended. Stomach/Bowel: Diverticular change of the colon is noted without evidence of diverticulitis. No obstructive or inflammatory changes of the colon are seen. The appendix is well visualized and air filled in the right lower quadrant. Small bowel and stomach are unremarkable. Vascular/Lymphatic: Aortic atherosclerosis. No enlarged abdominal or pelvic lymph nodes. Reproductive: Uterus and  bilateral adnexa are unremarkable. Other: No abdominal wall hernia or abnormality. No abdominopelvic ascites. Musculoskeletal: No acute or significant osseous findings. IMPRESSION: Bilateral nonobstructing renal calculi. One of these lies within the left renal pelvis and may cause some intermittent obstructive changes. Slight increased density is noted in the left renal pelvis as well which may be related to thrombus given the clinical history of hematuria. Cholelithiasis without complicating factors. Persistent paraspinal soft tissue along the lower thoracic spine stable from the prior exam. Follow-up as clinically indicated given the patient's advanced age. Electronically Signed   By: Alcide Clever M.D.   On: 03/30/2023 19:57   CT ABDOMEN PELVIS WO CONTRAST  Result Date: 03/16/2023 CLINICAL DATA:  Abdominal pain, sepsis EXAM: CT ABDOMEN AND PELVIS WITHOUT CONTRAST TECHNIQUE: Multidetector CT imaging of the abdomen and pelvis was performed following the standard protocol without IV contrast. RADIATION DOSE REDUCTION: This exam was performed according to the departmental dose-optimization program which includes automated exposure control, adjustment of the mA and/or kV according to patient size and/or use of iterative reconstruction technique. COMPARISON:  None Available. FINDINGS: Lower chest: Abnormal paraspinal soft tissue at the inferior chest, including a 15 x 20 mm LEFT paraspinal mass, 16 x 10 mm RIGHT paraspinal mass, and a 26 x 18 mm RIGHT paraspinal mass. Additional nodular pleural thickening extends along the posterior pleural surface in the RIGHT chest. No intrapulmonary mass or nodule. Enlargement of cardiac chambers. Low-attenuation of circulating blood question anemia. Hepatobiliary: Large calculi within gallbladder. Questionable gallbladder wall thickening. Small focus of gas within the gallbladder versus gallbladder wall. Question mild intrahepatic biliary dilatation. Mass RIGHT lobe liver 13 x  12 mm image 21 likely small cyst. No additional focal hepatic abnormalities. Pancreas: Atrophic pancreas without mass Spleen: Splenomegaly, spleen measuring 13.1 x 4.5 x 17.2 cm (volume = 530 cm^3). No focal mass. Adrenals/Urinary Tract: Adrenal glands unremarkable. Multiple calcifications in the kidneys, many renal vascular, some representing potentially small nonobstructing calculi. Small cyst inferior pole  LEFT kidney 11 mm diameter; no follow-up imaging recommended. No hydronephrosis, hydroureter, or ureteral calcification. Bladder unremarkable. Stomach/Bowel: Normal appendix. Scattered colonic diverticulosis without evidence of diverticulitis. Stomach and bowel loops otherwise unremarkable for technique. Vascular/Lymphatic: Extensive atherosclerotic calcifications. Aorta normal caliber. No adenopathy. Reproductive: Atrophic uterus and LEFT ovary. Cyst RIGHT ovary 21 x 20 mm; this is a simple appearing cyst and no follow-up imaging is recommended. Other: No free air or free fluid. No hernia or inflammatory process. Musculoskeletal: Osseous demineralization. Old healed fractures of LEFT superior and inferior pubic rami. Marked compression deformity of L5 vertebral body question minimally progressive since radiographs of 02/11/2023 superior endplate concavity L3 age indeterminate. IMPRESSION: Cholelithiasis with questionable gallbladder wall thickening and question mild intrahepatic biliary dilatation as well as a single focus of gas within the gallbladder or gallbladder wall; further evaluation by ultrasound recommended. Splenomegaly of uncertain chronicity. Colonic diverticulosis without evidence of diverticulitis. Abnormal paraspinal soft tissue nodules and thickening at the inferior chest as above; these are nonspecific, could represent pleural-based disease or tumor, paraspinal masses such as neural/ nerve sheath tumors, extramedullary hematopoiesis, and other etiologies; no prior imaging to assess chronicity  and only assessed at the inferior chest, recommend CT chest assessment Marked compression deformity of L5 vertebral body with sclerosis question minimally progressive since 02/11/2023. Extensive atherosclerotic calcifications. Aortic Atherosclerosis (ICD10-I70.0). Electronically Signed   By: Ulyses Southward M.D.   On: 03/16/2023 16:14   DG Chest Portable 1 View  Result Date: 03/16/2023 CLINICAL DATA:  Abdominal pain EXAM: PORTABLE CHEST 1 VIEW COMPARISON:  X-ray 01/28/2023 FINDINGS: Diffuse interstitial changes, unchanged from prior and likely chronic. No consolidation, pneumothorax or effusion. Normal cardiopericardial silhouette. Overlapping cardiac leads. Osteopenia with degenerative changes. Fixation hardware along the proximal right humerus. IMPRESSION: Hyperinflation with likely chronic interstitial changes. No effusion or consolidation. Electronically Signed   By: Karen Kays M.D.   On: 03/16/2023 15:51   US Abdomen Limited RUQ (LIVER/GB)  Result Date: 03/16/2023 CLINICAL DATA:  Right upper quadrant pain EXAM: ULTRASOUND ABDOMEN LIMITED RIGHT UPPER QUADRANT COMPARISON:  None Available. FINDINGS: Gallbladder: Multiple calculi with distal shadowing in the gallbladder. No pericholecystic fluid. Sonographer describes no sonographic Murphy's sign. In the gallbladder limiting distal evaluation Common bile duct: Diameter: 2 mm. No intrahepatic biliary ductal dilatation identified. Liver: 1.1 cm simple appearing cyst in the right lobe, previously 1.3 cm on 12/11/2020. No new lesion identified. Within normal limits in parenchymal echogenicity. Portal vein is patent on color Doppler imaging with normal direction of blood flow towards the liver. Other: None. IMPRESSION: Cholelithiasis. Electronically Signed   By: Corlis Leak M.D.   On: 03/16/2023 15:23    ASSESSMENT & PLAN Aaron Boeh 89 y.o. female with medical history significant for normocytic anemia and CKD who presents for a follow up visit.  #Anemia 2/2  to CKD -- We will discuss with nephrologist what dose of EPO she is receiving at this time --Labs today show white blood cell count *** --Iron labs appear consistent with anemia of chronic disease with markedly high ferritin and low total iron-binding opacity --We will need to consider bone marrow biopsy if hemoglobin levels or not improving with high levels of EPO --Patient would like to consider bone marrow biopsy.  She would like some time to think this over and does not wish to pursue it at this time. --RTC in 3 months time.   #Leukocytosis-resolved -- White blood cell count previoulsy14.9, neutrophilic predominance --Likely secondary to inflammation of the elbow --White blood cell count  9.3 today, resolved.  # Bilateral Axillary Lymphadenopathy-resolved -- Continue to monitor   No orders of the defined types were placed in this encounter.   All questions were answered. The patient knows to call the clinic with any problems, questions or concerns.  A total of more than 30 minutes were spent on this encounter with face-to-face time and non-face-to-face time, including preparing to see the patient, ordering tests and/or medications, counseling the patient and coordination of care as outlined above.   Ulysees Barns, MD Department of Hematology/Oncology [facility] Phone: [phone] Pager: [phone] Email: [email or web address]  04/05/2023 12:17 PM

## 2023-04-05 NOTE — Telephone Encounter (Signed)
Patients son called to cancel tomorrows appointments, he will call back to reschedule.

## 2023-04-06 ENCOUNTER — Inpatient Hospital Stay: Payer: Medicare (Managed Care) | Admitting: Hematology and Oncology

## 2023-04-06 ENCOUNTER — Inpatient Hospital Stay: Payer: Medicare (Managed Care)

## 2023-04-07 ENCOUNTER — Encounter (HOSPITAL_COMMUNITY): Payer: Self-pay

## 2023-04-16 ENCOUNTER — Emergency Department
Admission: EM | Admit: 2023-04-16 | Discharge: 2023-04-16 | Disposition: A | Discharge status: Home / Self Care | Payer: Medicare (Managed Care) | Attending: Emergency Medicine | Admitting: Emergency Medicine

## 2023-04-16 ENCOUNTER — Encounter (HOSPITAL_COMMUNITY): Payer: Self-pay

## 2023-04-16 DIAGNOSIS — N189 Chronic kidney disease, unspecified: Secondary | ICD-10-CM | POA: Insufficient documentation

## 2023-04-16 DIAGNOSIS — I129 Hypertensive chronic kidney disease with stage 1 through stage 4 chronic kidney disease, or unspecified chronic kidney disease: Secondary | ICD-10-CM | POA: Diagnosis not present

## 2023-04-16 DIAGNOSIS — D649 Anemia, unspecified: Secondary | ICD-10-CM | POA: Diagnosis not present

## 2023-04-16 DIAGNOSIS — R531 Weakness: Secondary | ICD-10-CM | POA: Diagnosis present

## 2023-04-16 LAB — COMPREHENSIVE METABOLIC PANEL
ALT: 16 U/L (ref 0–44)
AST: 23 U/L (ref 15–41)
Albumin: 3.2 g/dL — ABNORMAL LOW (ref 3.5–5.0)
Alkaline Phosphatase: 100 U/L (ref 38–126)
Anion gap: 11 (ref 5–15)
BUN: 91 mg/dL — ABNORMAL HIGH (ref 8–23)
CO2: 16 mmol/L — ABNORMAL LOW (ref 22–32)
Calcium: 8.2 mg/dL — ABNORMAL LOW (ref 8.9–10.3)
Chloride: 110 mmol/L (ref 98–111)
Creatinine, Ser: 3.98 mg/dL — ABNORMAL HIGH (ref 0.44–1.00)
GFR, Estimated: 10 mL/min — ABNORMAL LOW (ref >60)
Glucose, Bld: 119 mg/dL — ABNORMAL HIGH (ref 70–99)
Potassium: 5 mmol/L (ref 3.5–5.1)
Sodium: 137 mmol/L (ref 135–145)
Total Bilirubin: 1 mg/dL (ref 0.3–1.2)
Total Protein: 6.8 g/dL (ref 6.5–8.1)

## 2023-04-16 LAB — PREPARE RBC (CROSSMATCH)

## 2023-04-16 LAB — CBC WITH DIFFERENTIAL/PLATELET
Abs Immature Granulocytes: 1.8 10*3/uL — ABNORMAL HIGH (ref 0.00–0.07)
Band Neutrophils: 8 %
Basophils Absolute: 0 10*3/uL (ref 0.0–0.1)
Basophils Relative: 0 %
Eosinophils Absolute: 0 10*3/uL (ref 0.0–0.5)
Eosinophils Relative: 0 %
HCT: 22.8 % — ABNORMAL LOW (ref 36.0–46.0)
Hemoglobin: 7.1 g/dL — ABNORMAL LOW (ref 12.0–15.0)
Lymphocytes Relative: 14 %
Lymphs Abs: 3.2 10*3/uL (ref 0.7–4.0)
MCH: 29.7 pg (ref 26.0–34.0)
MCHC: 31.1 g/dL (ref 30.0–36.0)
MCV: 95.4 fL (ref 80.0–100.0)
Metamyelocytes Relative: 7 %
Monocytes Absolute: 0.9 10*3/uL (ref 0.1–1.0)
Monocytes Relative: 4 %
Myelocytes: 1 %
Neutro Abs: 14.5 10*3/uL — ABNORMAL HIGH (ref 1.7–7.7)
Neutrophils Relative %: 56 %
Other: 10 %
Platelets: 181 10*3/uL (ref 150–400)
RBC: 2.39 MIL/uL — ABNORMAL LOW (ref 3.87–5.11)
RDW: 15.9 % — ABNORMAL HIGH (ref 11.5–15.5)
Smear Review: NORMAL
WBC: 22.7 10*3/uL — ABNORMAL HIGH (ref 4.0–10.5)
nRBC: 0.5 % — ABNORMAL HIGH (ref 0.0–0.2)

## 2023-04-16 LAB — TROPONIN I (HIGH SENSITIVITY): Troponin I (High Sensitivity): 11 ng/L (ref <18)

## 2023-04-16 MED ORDER — SODIUM CHLORIDE 0.9 % IV BOLUS
1000.0000 mL | Freq: Once | INTRAVENOUS | Status: AC
  Administered 2023-04-16: 1000 mL via INTRAVENOUS

## 2023-04-16 MED ORDER — SODIUM CHLORIDE 0.9 % IV SOLN: 10.0000 mL/h | Freq: Once | INTRAVENOUS | Status: DC

## 2023-04-16 NOTE — ED Provider Notes (Signed)
Patient signed out to me pending discharge after transfusion.  On reassessment after unit of blood patient has stable vital signs and no complaints at this time.  Recommended she follow-up with her oncologist who typically manages the blood transfusions.  Patient appropriate for discharge.   Georga Hacking, MD 04/16/23 [phone]

## 2023-04-16 NOTE — ED Triage Notes (Signed)
Pt presents to the ED due to syncope episode. Pt's son states she went to [facility] to get her blood drawn to monitor hemoglobin. Pt get schedule blood transfusions. Pt Hemoglobin on Friday was 6.9 via [facility] and had a scheduled transfusion on Monday but son was concerned once she had a syncope episode. Pt confused at baseline. Pt appears pale.

## 2023-04-16 NOTE — ED Provider Notes (Signed)
[facility] Provider Note   Event Date/Time   First MD Initiated Contact with Patient 04/16/23 [phone]     (approximate) History  Loss of Consciousness  HPI Julia Kerr is a 89 y.o. female with history of hypertension and chronic kidney disease with associated anemia of chronic disease who presents with her son after a episode of generalized weakness in which she lost strength in her legs and went down to the floor.  Patient was assisted down to the floor and denies any head trauma or loss of consciousness.  Son at bedside states that patient had a hemoglobin of 6.9 that was found on routine lab work and was scheduled to follow-up on Monday for a transfusion however patient became acutely weak and presented to the emergency department today.  Patient also complains of orthostatic lightheadedness and pallor ROS: Patient currently denies any vision changes, tinnitus, difficulty speaking, facial droop, sore throat, chest pain, shortness of breath, abdominal pain, nausea/vomiting/diarrhea, dysuria, or numbness/paresthesias in any extremity   Physical Exam  Triage Vital Signs: ED Triage Vitals  Enc Vitals Group     BP 04/16/23 0813 (!) 99/44     Pulse Rate 04/16/23 0813 86     Resp 04/16/23 0813 16     Temp 04/16/23 0813 98.3 F (36.8 C)     Temp Source 04/16/23 0813 Oral     SpO2 04/16/23 0813 99 %     Weight --      Height --      Head Circumference --      Peak Flow --      Pain Score 04/16/23 0814 0     Pain Loc --      Pain Edu? --      Excl. in GC? --    Most recent vital signs: Vitals:   04/16/23 0813 04/16/23 0830  BP: (!) 99/44 (!) 112/49  Pulse: 86 80  Resp: 16 11  Temp: 98.3 F (36.8 C)   SpO2: 99% 100%   General: Awake, oriented x4. CV:  Good peripheral perfusion.  Resp:  Normal effort.  Abd:  No distention.  Other:  Elderly cachectic Hispanic female laying in bed with in no acute distress ED Results / Procedures / Treatments  Labs (all  labs ordered are listed, but only abnormal results are displayed) Labs Reviewed  COMPREHENSIVE METABOLIC PANEL - Abnormal; Notable for the following components:      Result Value   CO2 16 (*)    Glucose, Bld 119 (*)    BUN 91 (*)    Creatinine, Ser 3.98 (*)    Calcium 8.2 (*)    Albumin 3.2 (*)    GFR, Estimated 10 (*)    All other components within normal limits  CBC WITH DIFFERENTIAL/PLATELET - Abnormal; Notable for the following components:   WBC 22.7 (*)    RBC 2.39 (*)    Hemoglobin 7.1 (*)    HCT 22.8 (*)    RDW 15.9 (*)    nRBC 0.5 (*)    All other components within normal limits  TYPE AND SCREEN  PREPARE RBC (CROSSMATCH)  TROPONIN I (HIGH SENSITIVITY)  TROPONIN I (HIGH SENSITIVITY)   EKG ED ECG REPORT I, Merwyn Katos, the attending physician, personally viewed and interpreted this ECG. Date: 04/16/2023 EKG Time: 0817 Rate: 84 Rhythm: normal sinus rhythm QRS Axis: normal Intervals: normal ST/T Wave abnormalities: normal Narrative Interpretation: no evidence of acute ischemia PROCEDURES: Critical Care performed: No Procedures  MEDICATIONS ORDERED IN ED: Medications  0.9 %  sodium chloride infusion (has no administration in time range)   IMPRESSION / MDM / ASSESSMENT AND PLAN / ED COURSE  I reviewed the triage vital signs and the nursing notes.                             The patient is on the cardiac monitor to evaluate for evidence of arrhythmia and/or significant heart rate changes. Patient's presentation is most consistent with acute presentation with potential threat to life or bodily function. Patient presents for symptomatic anemia secondary to chronic kidney disease. Patient with known cause of anemia and follow up scheduled. Given 1 unit of blood with resolution of symptoms afterwards. Patient had no reaction to blood transfusion. Patient feels well on discharge with plan to follow up with PMD.   FINAL CLINICAL IMPRESSION(S) / ED DIAGNOSES   Final  diagnoses:  Symptomatic anemia  Generalized weakness   Rx / DC Orders   ED Discharge Orders     None      Note:  This document was prepared using Dragon voice recognition software and may include unintentional dictation errors.   Merwyn Katos, MD 04/16/23 [phone]

## 2023-04-17 LAB — PREPARE RBC (CROSSMATCH)

## 2023-04-18 LAB — TYPE AND SCREEN
ABO/RH(D): A POS
Antibody Screen: POSITIVE
Unit division: 0
Unit division: 0
Unit division: 0

## 2023-04-18 LAB — BPAM RBC
Blood Product Expiration Date: 202405162359
Blood Product Expiration Date: 202405232359
Blood Product Expiration Date: 202405242359
ISSUE DATE / TIME: 202404271422
ISSUE DATE / TIME: 202404282328
ISSUE DATE / TIME: 202404282328
Unit Type and Rh: 6200
Unit Type and Rh: 6200
Unit Type and Rh: 6200

## 2023-04-29 ENCOUNTER — Inpatient Hospital Stay
Admission: EM | Admit: 2023-04-29 | Discharge: 2023-05-04 | DRG: 683 | Disposition: A | Discharge status: Home / Self Care | Payer: Medicare (Managed Care) | Attending: Internal Medicine | Admitting: Internal Medicine

## 2023-04-29 ENCOUNTER — Other Ambulatory Visit: Payer: Self-pay

## 2023-04-29 ENCOUNTER — Encounter: Payer: Self-pay | Admitting: Emergency Medicine

## 2023-04-29 ENCOUNTER — Emergency Department: Payer: Medicare (Managed Care)

## 2023-04-29 DIAGNOSIS — R54 Age-related physical debility: Secondary | ICD-10-CM | POA: Insufficient documentation

## 2023-04-29 DIAGNOSIS — G9349 Other encephalopathy: Secondary | ICD-10-CM | POA: Diagnosis present

## 2023-04-29 DIAGNOSIS — N179 Acute kidney failure, unspecified: Secondary | ICD-10-CM | POA: Diagnosis not present

## 2023-04-29 DIAGNOSIS — Z681 Body mass index (BMI) 19 or less, adult: Secondary | ICD-10-CM

## 2023-04-29 DIAGNOSIS — D649 Anemia, unspecified: Secondary | ICD-10-CM | POA: Diagnosis not present

## 2023-04-29 DIAGNOSIS — Z91018 Allergy to other foods: Secondary | ICD-10-CM

## 2023-04-29 DIAGNOSIS — N189 Chronic kidney disease, unspecified: Secondary | ICD-10-CM

## 2023-04-29 DIAGNOSIS — Z515 Encounter for palliative care: Secondary | ICD-10-CM

## 2023-04-29 DIAGNOSIS — R627 Adult failure to thrive: Secondary | ICD-10-CM | POA: Diagnosis present

## 2023-04-29 DIAGNOSIS — E8729 Other acidosis: Secondary | ICD-10-CM | POA: Insufficient documentation

## 2023-04-29 DIAGNOSIS — D464 Refractory anemia, unspecified: Secondary | ICD-10-CM | POA: Diagnosis present

## 2023-04-29 DIAGNOSIS — Z91012 Allergy to eggs: Secondary | ICD-10-CM

## 2023-04-29 DIAGNOSIS — Z79899 Other long term (current) drug therapy: Secondary | ICD-10-CM

## 2023-04-29 DIAGNOSIS — K8044 Calculus of bile duct with chronic cholecystitis without obstruction: Secondary | ICD-10-CM | POA: Diagnosis present

## 2023-04-29 DIAGNOSIS — N184 Chronic kidney disease, stage 4 (severe): Secondary | ICD-10-CM | POA: Diagnosis present

## 2023-04-29 DIAGNOSIS — D631 Anemia in chronic kidney disease: Secondary | ICD-10-CM | POA: Diagnosis present

## 2023-04-29 DIAGNOSIS — K8050 Calculus of bile duct without cholangitis or cholecystitis without obstruction: Secondary | ICD-10-CM | POA: Diagnosis present

## 2023-04-29 DIAGNOSIS — Z66 Do not resuscitate: Secondary | ICD-10-CM | POA: Diagnosis present

## 2023-04-29 DIAGNOSIS — K9049 Malabsorption due to intolerance, not elsewhere classified: Secondary | ICD-10-CM | POA: Diagnosis present

## 2023-04-29 DIAGNOSIS — Z91011 Allergy to milk products: Secondary | ICD-10-CM

## 2023-04-29 DIAGNOSIS — E8722 Chronic metabolic acidosis: Secondary | ICD-10-CM | POA: Diagnosis present

## 2023-04-29 DIAGNOSIS — D72829 Elevated white blood cell count, unspecified: Secondary | ICD-10-CM

## 2023-04-29 DIAGNOSIS — R112 Nausea with vomiting, unspecified: Secondary | ICD-10-CM | POA: Diagnosis present

## 2023-04-29 DIAGNOSIS — I120 Hypertensive chronic kidney disease with stage 5 chronic kidney disease or end stage renal disease: Secondary | ICD-10-CM | POA: Diagnosis present

## 2023-04-29 DIAGNOSIS — F0390 Unspecified dementia without behavioral disturbance: Secondary | ICD-10-CM | POA: Diagnosis present

## 2023-04-29 DIAGNOSIS — R64 Cachexia: Secondary | ICD-10-CM | POA: Diagnosis present

## 2023-04-29 LAB — PROTIME-INR
INR: 1.5 — ABNORMAL HIGH (ref 0.8–1.2)
Prothrombin Time: 18.3 seconds — ABNORMAL HIGH (ref 11.4–15.2)

## 2023-04-29 LAB — CBC WITH DIFFERENTIAL/PLATELET
Abs Immature Granulocytes: 2.41 10*3/uL — ABNORMAL HIGH (ref 0.00–0.07)
Basophils Absolute: 0.1 10*3/uL (ref 0.0–0.1)
Basophils Relative: 0 %
Eosinophils Absolute: 0 10*3/uL (ref 0.0–0.5)
Eosinophils Relative: 0 %
HCT: 23.7 % — ABNORMAL LOW (ref 36.0–46.0)
Hemoglobin: 7.3 g/dL — ABNORMAL LOW (ref 12.0–15.0)
Immature Granulocytes: 8 %
Lymphocytes Relative: 26 %
Lymphs Abs: 7.5 10*3/uL — ABNORMAL HIGH (ref 0.7–4.0)
MCH: 29.4 pg (ref 26.0–34.0)
MCHC: 30.8 g/dL (ref 30.0–36.0)
MCV: 95.6 fL (ref 80.0–100.0)
Monocytes Absolute: 2.8 10*3/uL — ABNORMAL HIGH (ref 0.1–1.0)
Monocytes Relative: 10 %
Neutro Abs: 16.4 10*3/uL — ABNORMAL HIGH (ref 1.7–7.7)
Neutrophils Relative %: 56 %
Platelets: 241 10*3/uL (ref 150–400)
RBC: 2.48 MIL/uL — ABNORMAL LOW (ref 3.87–5.11)
RDW: 15.8 % — ABNORMAL HIGH (ref 11.5–15.5)
Smear Review: NORMAL
WBC: 29.4 10*3/uL — ABNORMAL HIGH (ref 4.0–10.5)
nRBC: 0.3 % — ABNORMAL HIGH (ref 0.0–0.2)

## 2023-04-29 LAB — COMPREHENSIVE METABOLIC PANEL
ALT: 11 U/L (ref 0–44)
AST: 18 U/L (ref 15–41)
Albumin: 3.4 g/dL — ABNORMAL LOW (ref 3.5–5.0)
Alkaline Phosphatase: 101 U/L (ref 38–126)
Anion gap: 18 — ABNORMAL HIGH (ref 5–15)
BUN: 132 mg/dL — ABNORMAL HIGH (ref 8–23)
CO2: 10 mmol/L — ABNORMAL LOW (ref 22–32)
Calcium: 7.4 mg/dL — ABNORMAL LOW (ref 8.9–10.3)
Chloride: 106 mmol/L (ref 98–111)
Creatinine, Ser: 6.67 mg/dL — ABNORMAL HIGH (ref 0.44–1.00)
GFR, Estimated: 5 mL/min — ABNORMAL LOW (ref >60)
Glucose, Bld: 141 mg/dL — ABNORMAL HIGH (ref 70–99)
Potassium: 4.5 mmol/L (ref 3.5–5.1)
Sodium: 134 mmol/L — ABNORMAL LOW (ref 135–145)
Total Bilirubin: 0.9 mg/dL (ref 0.3–1.2)
Total Protein: 6.9 g/dL (ref 6.5–8.1)

## 2023-04-29 LAB — TROPONIN I (HIGH SENSITIVITY)
Troponin I (High Sensitivity): 15 ng/L (ref <18)
Troponin I (High Sensitivity): 17 ng/L (ref <18)

## 2023-04-29 LAB — LIPASE, BLOOD: Lipase: 121 U/L — ABNORMAL HIGH (ref 11–51)

## 2023-04-29 LAB — PREPARE RBC (CROSSMATCH)

## 2023-04-29 MED ORDER — ESCITALOPRAM OXALATE 10 MG PO TABS
5.0000 mg | ORAL_TABLET | Freq: Every day | ORAL | Status: DC
  Administered 2023-04-30 – 2023-05-02 (×3): 5 mg via ORAL
  Filled 2023-04-29 (×4): qty 1

## 2023-04-29 MED ORDER — ENOXAPARIN SODIUM 40 MG/0.4ML IJ SOSY: 40.0000 mg | PREFILLED_SYRINGE | INTRAMUSCULAR | Status: DC

## 2023-04-29 MED ORDER — LACTATED RINGERS IV SOLN: INTRAVENOUS | Status: DC

## 2023-04-29 MED ORDER — ONDANSETRON HCL 4 MG PO TABS
4.0000 mg | ORAL_TABLET | Freq: Four times a day (QID) | ORAL | Status: DC | PRN
  Administered 2023-05-01: 4 mg via ORAL
  Filled 2023-04-29: qty 1

## 2023-04-29 MED ORDER — ACETAMINOPHEN 325 MG PO TABS: 650.0000 mg | ORAL_TABLET | Freq: Four times a day (QID) | ORAL | Status: DC | PRN

## 2023-04-29 MED ORDER — SODIUM BICARBONATE 650 MG PO TABS
650.0000 mg | ORAL_TABLET | Freq: Two times a day (BID) | ORAL | Status: DC
  Administered 2023-04-29 – 2023-05-02 (×6): 650 mg via ORAL
  Filled 2023-04-29 (×8): qty 1

## 2023-04-29 MED ORDER — SODIUM CHLORIDE 0.9 % IV SOLN
10.0000 mL/h | Freq: Once | INTRAVENOUS | Status: AC
  Administered 2023-04-30: 10 mL/h via INTRAVENOUS

## 2023-04-29 MED ORDER — ONDANSETRON HCL 4 MG/2ML IJ SOLN
4.0000 mg | Freq: Four times a day (QID) | INTRAMUSCULAR | Status: DC | PRN
  Administered 2023-05-01 – 2023-05-02 (×2): 4 mg via INTRAVENOUS
  Filled 2023-04-29 (×2): qty 2

## 2023-04-29 MED ORDER — ACETAMINOPHEN 650 MG RE SUPP: 650.0000 mg | Freq: Four times a day (QID) | RECTAL | Status: DC | PRN

## 2023-04-29 MED ORDER — HYDROCODONE-ACETAMINOPHEN 5-325 MG PO TABS
1.0000 | ORAL_TABLET | ORAL | Status: DC | PRN
  Administered 2023-05-01: 2 via ORAL
  Filled 2023-04-29 (×2): qty 2

## 2023-04-29 MED ORDER — HEPARIN SODIUM (PORCINE) 5000 UNIT/ML IJ SOLN
5000.0000 [IU] | Freq: Three times a day (TID) | INTRAMUSCULAR | Status: DC
  Administered 2023-04-29 – 2023-05-01 (×7): 5000 [IU] via SUBCUTANEOUS
  Filled 2023-04-29 (×7): qty 1

## 2023-04-29 MED ORDER — SODIUM CHLORIDE 0.9 % IV BOLUS
500.0000 mL | Freq: Once | INTRAVENOUS | Status: AC
  Administered 2023-04-30: 500 mL via INTRAVENOUS

## 2023-04-29 NOTE — ED Provider Triage Note (Signed)
Emergency Medicine Provider Triage Evaluation Note  Julia Kerr , a 89 y.o. female  was evaluated in triage.  Pt complains of shob, weakness, syncope. Symptoms today, HX symptomatic anemia. Needs transfusions, recent drop in hgb.  Review of Systems  Positive: Shob, weakness, syncope Negative: Fever, cough, CP  Physical Exam  Ht 4\' 9"  (1.448 m)   Wt 31.8 kg   BMI 15.15 kg/m  Gen:   Awake, no distress   Resp:  Normal effort  MSK:   Moves extremities without difficulty  Other:    Medical Decision Making  Medically screening exam initiated at 4:35 PM.  Appropriate orders placed.  Demetrice Sopher was informed that the remainder of the evaluation will be completed by another provider, this initial triage assessment does not replace that evaluation, and the importance of remaining in the ED until their evaluation is complete.  Labs, EKG, xray, urine   Racheal Patches, PA-C 04/29/23 1635

## 2023-04-29 NOTE — Assessment & Plan Note (Addendum)
High anion gap metabolic acidosis Secondary to fluid losses from diarrhea and intermittent vomiting as well as poor oral intake.  Anion gap improved. Creatinine 6.67 up from baseline of 1.78 -IV hydration -Continue oral bicarb - Monitor renal function and avoid nephrotoxins -Patient will not be a candidate for dialysis

## 2023-04-29 NOTE — Assessment & Plan Note (Addendum)
Diarrhea with intermittent nausea and vomiting Suspect symptomatic biliary colic LFTs WNL and abdominal exam benign - Add on lipase -Supportive care -Patient is not a surgical candidate -Monitor closely.  If acute worsening will consider consulting surgery

## 2023-04-29 NOTE — Assessment & Plan Note (Signed)
Possibly reactive CT abdomen and pelvis was nonacute with no acute cholecystitis - Continue to monitor WBCs - Holding off on antibiotics for now - GI panel and stool for C. Difficile-negative

## 2023-04-29 NOTE — H&P (Signed)
History and Physical    Patient: Julia Kerr HQI:[id number] DOB: Nov 13, 1923 DOA: 04/29/2023 DOS: the patient was seen and examined on 04/29/2023 PCP: System, Provider Not In  Patient coming from: Home  Chief Complaint:  Chief Complaint  Patient presents with   Shortness of Breath    HPI: Julia Kerr is a 89 y.o. female with medical history significant for CKD stage IIIb-IV, choledocholithiasis with chronic cholecystitis not amenable to surgery due to age, anemia of CKD on erythropoietin injections with frequent need for transfusions most recently 4/28 on the ED, who presents with a complaint of shortness of breath that started on the day of arrival.  Patient had a recent hospitalizationFrom 3/27 to 03/18/2023 for biliary colic that improved without surgical intervention.  Most of the history is provided by the son at the bedside who stated that since her recent past hospitalization,  her intake has been minimal because she is afraid to eat because of her biliary problem.  She has had intermittent vomiting which had improved  since her discharge.  Her intermittent diarrhea had also improved however restarted earlier in the day of arrival.  She has mild abdominal discomfort and had no fever or chills she has had no chest pain, cough . She has no black or bloody stool.  He states that the home health nurse checks her hemoglobin periodically and when he reported her symptoms of shortness of breath they advised him that her most recent check a few days prior was on the 7 and advised him to go to the emergency room.   ED course and dataReview: Soft blood pressure of 101/54 with otherwise normal vitals.  Labs significant for WBC of 29.4, hemoglobin 7.3, stable from 7.1 a couple weeks prior though she did receive a unit of blood in the interim.  Creatinine 6.67 up from baseline of 1.78, with BUN 132, anion gap 18 and bicarb of 10.  LFTs were unremarkable. Troponin 17 Stool guaiac negative. EKG, personally  viewed and interpreted shows NSR at 99 with no ischemic ST-T wave changes. Imaging: CT renal stone study was nonacute showing the following: IMPRESSION: 1. No cholelithiasis with no acute cholecystitis. 2. Colonic diverticulosis with no acute diverticulitis. 3. Stable splenomegaly. 4. Stable and L3 and L5 compression fractures. 5. Otherwise no acute intra-abdominal intrapelvic abnormality with limited evaluation on this noncontrast study.  Chest x-ray was nonacute Patient treated with an NS bolus and a unit of PRBCs ordered.  For symptomatic anemia Hospitalist consulted for admission.    Past Medical History:  Diagnosis Date   Anemia    Chronic kidney disease    Past Surgical History:  Procedure Laterality Date   CATARACT EXTRACTION, BILATERAL     SHOULDER SURGERY     Social History:  reports that she has never smoked. She has never used smokeless tobacco. She reports that she does not drink alcohol and does not use drugs.  Allergies  Allergen Reactions   Eggs-Apples-Oats [Alitraq] Other (See Comments)    Strawberries and Apples cause burning of lips   Food Other (See Comments)    Apples and strawberries cause burning of lips   Strawberry (Diagnostic) Other (See Comments)    Strawberry & apple causes burning of the lips   Strawberry Extract Other (See Comments)    Strawberry & apple allergy----causes burning of the lips    History reviewed. No pertinent family history.  Prior to Admission medications   Medication Sig Start Date End Date Taking? Authorizing Provider  cephALEXin (  KEFLEX) 500 MG capsule Take 1 capsule (500 mg total) by mouth 2 (two) times daily. 03/30/23   Minna Antis, MD  escitalopram (LEXAPRO) 5 MG tablet Take 5 mg by mouth daily.    [provider]  Multiple Vitamin (MULTIVITAMIN WITH MINERALS) TABS tablet Take 1 tablet by mouth daily. 08/06/22   Hollice Espy, MD  Polyethylene Glycol 400 (VISINE DRY EYE RELIEF OP) Place 1 drop into  both eyes as needed (dry eyes).    [provider]  sodium bicarbonate 650 MG tablet Take 1 tablet (650 mg total) by mouth 2 (two) times daily. 01/30/23   Marrion Coy, MD    Physical Exam: Vitals:   04/29/23 1632 04/29/23 1635 04/29/23 1900  BP:  (!) 101/54 (!) 114/51  Pulse:  97   Resp:  20 18  SpO2:  98%   Weight: 31.8 kg    Height: 4\' 9"  (1.448 m)     Physical Exam  Labs on Admission: I have personally reviewed following labs and imaging studies  CBC: Recent Labs  Lab 04/29/23 1636  WBC 29.4*  NEUTROABS 16.4*  HGB 7.3*  HCT 23.7*  MCV 95.6  PLT 241   Basic Metabolic Panel: Recent Labs  Lab 04/29/23 1636  NA 134*  K 4.5  CL 106  CO2 10*  GLUCOSE 141*  BUN 132*  CREATININE 6.67*  CALCIUM 7.4*   GFR: Estimated Creatinine Clearance: 2.4 mL/min (A) (by C-G formula based on SCr of 6.67 mg/dL (H)). Liver Function Tests: Recent Labs  Lab 04/29/23 1636  AST 18  ALT 11  ALKPHOS 101  BILITOT 0.9  PROT 6.9  ALBUMIN 3.4*   No results for input(s): "LIPASE", "AMYLASE" in the last 168 hours. No results for input(s): "AMMONIA" in the last 168 hours. Coagulation Profile: Recent Labs  Lab 04/29/23 1636  INR 1.5*   Cardiac Enzymes: No results for input(s): "CKTOTAL", "CKMB", "CKMBINDEX", "TROPONINI" in the last 168 hours. BNP (last 3 results) No results for input(s): "PROBNP" in the last 8760 hours. HbA1C: No results for input(s): "HGBA1C" in the last 72 hours. CBG: No results for input(s): "GLUCAP" in the last 168 hours. Lipid Profile: No results for input(s): "CHOL", "HDL", "LDLCALC", "TRIG", "CHOLHDL", "LDLDIRECT" in the last 72 hours. Thyroid Function Tests: No results for input(s): "TSH", "T4TOTAL", "FREET4", "T3FREE", "THYROIDAB" in the last 72 hours. Anemia Panel: No results for input(s): "VITAMINB12", "FOLATE", "FERRITIN", "TIBC", "IRON", "RETICCTPCT" in the last 72 hours. Urine analysis:    Component Value Date/Time   COLORURINE RED  (A) 03/30/2023 1944   APPEARANCEUR TURBID (A) 03/30/2023 1944   LABSPEC 1.012 03/30/2023 1944   PHURINE  03/30/2023 1944    TEST NOT REPORTED DUE TO COLOR INTERFERENCE OF URINE PIGMENT   GLUCOSEU (A) 03/30/2023 1944    TEST NOT REPORTED DUE TO COLOR INTERFERENCE OF URINE PIGMENT   HGBUR (A) 03/30/2023 1944    TEST NOT REPORTED DUE TO COLOR INTERFERENCE OF URINE PIGMENT   BILIRUBINUR (A) 03/30/2023 1944    TEST NOT REPORTED DUE TO COLOR INTERFERENCE OF URINE PIGMENT   KETONESUR (A) 03/30/2023 1944    TEST NOT REPORTED DUE TO COLOR INTERFERENCE OF URINE PIGMENT   PROTEINUR (A) 03/30/2023 1944    TEST NOT REPORTED DUE TO COLOR INTERFERENCE OF URINE PIGMENT   NITRITE (A) 03/30/2023 1944    TEST NOT REPORTED DUE TO COLOR INTERFERENCE OF URINE PIGMENT   LEUKOCYTESUR (A) 03/30/2023 1944    TEST NOT REPORTED DUE TO COLOR  INTERFERENCE OF URINE PIGMENT    Radiological Exams on Admission: CT Renal Stone Study  Result Date: 04/29/2023 CLINICAL DATA:  Abdominal/flank pain, stone suspected EXAM: CT ABDOMEN AND PELVIS WITHOUT CONTRAST TECHNIQUE: Multidetector CT imaging of the abdomen and pelvis was performed following the standard protocol without IV contrast. RADIATION DOSE REDUCTION: This exam was performed according to the departmental dose-optimization program which includes automated exposure control, adjustment of the mA and/or kV according to patient size and/or use of iterative reconstruction technique. COMPARISON:  CT abdomen pelvis 03/16/2023, CT renal 03/30/2023 FINDINGS: Lower chest: No acute abnormality. Hepatobiliary: Stable nonspecific 1.2 cm right posterior hepatic lobe lesion. Multiple large (up to 2.5 cm) calcified gallstone noted within the gallbladder lumen. No gallbladder wall thickening or pericholecystic fluid. No biliary dilatation. Pancreas: No focal lesion. Normal pancreatic contour. No surrounding inflammatory changes. No main pancreatic ductal dilatation. Spleen: Stable splenic  enlargement measuring up to 14.5 cm. No splenic lesion. Adrenals/Urinary Tract: No adrenal nodule bilaterally. Bilateral kidneys enhance symmetrically. No hydronephrosis. No hydroureter. The urinary bladder is unremarkable. Stomach/Bowel: Stomach is within normal limits. No evidence of bowel wall thickening or dilatation. Colonic diverticulosis with no acute diverticulitis. Appendix appears normal. Vascular/Lymphatic: No abdominal aorta or iliac aneurysm. Severe atherosclerotic plaque of the aorta and its branches. Prominent but nonenlarged retroperitoneal lymph nodes. No abdominal, pelvic, or inguinal lymphadenopathy. Reproductive: Uterus and bilateral adnexa are unremarkable. Other: No intraperitoneal free fluid. No intraperitoneal free gas. No organized fluid collection. Musculoskeletal: No abdominal wall hernia or abnormality. No suspicious lytic or blastic osseous lesions. No acute displaced fracture. Old healed left superior and inferior pubic rami fracture. Stable L3 superior endplate compression fracture. Stable L5 sclerotic lesion and compression fracture. Stable breast 4 mm retropulsion into the central canal. IMPRESSION: 1. No cholelithiasis with no acute cholecystitis. 2. Colonic diverticulosis with no acute diverticulitis. 3. Stable splenomegaly. 4. Stable and L3 and L5 compression fractures. 5. Otherwise no acute intra-abdominal intrapelvic abnormality with limited evaluation on this noncontrast study. Electronically Signed   By: Tish Frederickson M.D.   On: 04/29/2023 20:13   DG Chest 2 View  Result Date: 04/29/2023 CLINICAL DATA:  Shortness of breath.  Syncope EXAM: CHEST - 2 VIEW COMPARISON:  X-ray 03/16/2023 and older FINDINGS: Hyperinflation. Stable cardiopericardial silhouette. Film is rotated to the left. Mild interstitial prominence could be chronic. No consolidation, pneumothorax or effusion. No edema. Degenerative changes are seen along the spine. Fixation hardware seen along the proximal  right humerus. Prominent vascular calcifications in the upper abdomen. IMPRESSION: Hyperinflation with likely chronic changes. Electronically Signed   By: Karen Kays M.D.   On: 04/29/2023 17:07     Data Reviewed: Relevant notes from primary care and specialist visits, past discharge summaries as available in EHR, including Care Everywhere. Prior diagnostic testing as pertinent to current admission diagnoses Updated medications and problem lists for reconciliation ED course, including vitals, labs, imaging, treatment and response to treatment Triage notes, nursing and pharmacy notes and ED provider's notes Notable results as noted in HPI   Assessment and Plan: * Symptomatic anemia in the setting of in the from stage IIIb/IV chronic kidney disease Patient is chronically anemic and receives erythropoietin and as needed blood transfusions, most recently 4/28 No evidence of acute blood loss.  Stool guaiac negative Anemia secondary to CKD and chronic illness Per son, patient is not on hospice and continues to get as needed blood work and as needed transfusions. -Palliative care consult might be appropriate for goals of care discussion -  Continue transfusion of 1 unit PRBCs ordered from the ED (son is requesting 2 units)  AKI (acute kidney injury) (HCC) in the setting of stage IIIb/IV chronic kidney disease High anion gap metabolic acidosis Secondary to fluid losses from diarrhea and intermittent vomiting as well as poor oral intake Creatinine 6.67 up from baseline of 1.78 -IV hydration -Continue oral bicarb -Avoid nephrotoxins  Choledocholithiasis with chronic cholecystitis- not amenable to surgery due to age Diarrhea with intermittent nausea and vomiting Suspect symptomatic biliary colic LFTs WNL and abdominal exam benign - Add on lipase -Supportive care -Patient is not a surgical candidate -Monitor closely.  If acute worsening will consider consulting surgery  Leukocytosis, acute  on chronic Possibly reactive CT abdomen and pelvis was nonacute with no acute cholecystitis - Continue to monitor WBCs - Holding off on antibiotics for now  Frailty Suspect severe protein calorie malnutrition - Nutritionist evaluation as patient has very poor intake due to food intolerance related to her gallbladder issues      DVT prophylaxis: Lovenox  Consults: none  Advance Care Planning:   Code Status: Prior   Family Communication: Son [name] Prezioso  Disposition Plan: Back to previous home environment  Severity of Illness: The appropriate patient status for this patient is OBSERVATION. Observation status is judged to be reasonable and necessary in order to provide the required intensity of service to ensure the patient's safety. The patient's presenting symptoms, physical exam findings, and initial radiographic and laboratory data in the context of their medical condition is felt to place them at decreased risk for further clinical deterioration. Furthermore, it is anticipated that the patient will be medically stable for discharge from the hospital within 2 midnights of admission.   Author: Andris Baumann, MD 04/29/2023 9:01 PM  For on call review [email or web address].

## 2023-04-29 NOTE — ED Notes (Addendum)
No skin breakdown noted upon MD's rectal exam

## 2023-04-29 NOTE — Assessment & Plan Note (Addendum)
Patient is chronically anemic and receives erythropoietin and as needed blood transfusions, most recently 4/28 No evidence of acute blood loss.  Stool guaiac negative Anemia secondary to CKD and chronic illness. Received 1 unit of PRBC-pending repeat check Palliative care was consulted-son is now leaning more toward hospice-patient is currently paced patient and need to clarify from them.  No final decision about comfort care but most likely be started soon, another palliative care meeting tomorrow

## 2023-04-29 NOTE — ED Triage Notes (Signed)
Pt via POV from home. States pt's c/o SOB this AM. States that the nurse called him and said her hgb was low. Pt has a hx of anemia and was here last week for a transfusion. Denies cough. Denies chest pain. Pt is calm and cooperative.

## 2023-04-29 NOTE — Assessment & Plan Note (Signed)
Suspect severe protein calorie malnutrition - Nutritionist evaluation as patient has very poor intake due to food intolerance related to her gallbladder issues

## 2023-04-29 NOTE — ED Notes (Signed)
Report given to floor

## 2023-04-29 NOTE — ED Provider Notes (Signed)
[facility] Provider Note    Event Date/Time   First MD Initiated Contact with Patient 04/29/23 1911     (approximate)   History   Shortness of Breath   HPI  Julia Kerr is a 89 y.o. female   Past medical history of CKD and anemia who presents to the emergency department with symptomatic anemia, her hemoglobin checked and was low.  She denies GI bleeding.  She has shortness of breath, generalized weakness and fatigue.  She feels this way when her hemoglobin is low.     She denies any pain.  She denies any fever or chills.  She denies any trauma.  She denies any other acute medical complaints.     Independent Historian contributed to assessment above: Her son who is at bedside corroborates information given above and gives much of her past medical history given the patient's medical condition and some confusion     Physical Exam   Triage Vital Signs: ED Triage Vitals  Enc Vitals Group     BP 04/29/23 1635 (!) 101/54     Pulse Rate 04/29/23 1635 97     Resp 04/29/23 1635 20     Temp --      Temp src --      SpO2 04/29/23 1635 98 %     Weight 04/29/23 1632 70 lb (31.8 kg)     Height 04/29/23 1632 4\' 9"  (1.448 m)     Head Circumference --      Peak Flow --      Pain Score 04/29/23 1631 0     Pain Loc --      Pain Edu? --      Excl. in GC? --     Most recent vital signs: Vitals:   04/29/23 2122 04/29/23 2222  BP:  (!) 105/49  Pulse:  88  Resp:  20  Temp: (!) 97.5 F (36.4 C) 97.9 F (36.6 C)  SpO2: 99% 100%    General: Awake, no distress.  CV:  Good peripheral perfusion.  Resp:  Normal effort.  Abd:  No distention. Other:  Cachectic and chronically ill-appearing, soft nontender abdomen, lungs clear, rectal exam with brown stool guaiac negative.   ED Results / Procedures / Treatments   Labs (all labs ordered are listed, but only abnormal results are displayed) Labs Reviewed  COMPREHENSIVE METABOLIC PANEL - Abnormal; Notable  for the following components:      Result Value   Sodium 134 (*)    CO2 10 (*)    Glucose, Bld 141 (*)    BUN 132 (*)    Creatinine, Ser 6.67 (*)    Calcium 7.4 (*)    Albumin 3.4 (*)    GFR, Estimated 5 (*)    Anion gap 18 (*)    All other components within normal limits  CBC WITH DIFFERENTIAL/PLATELET - Abnormal; Notable for the following components:   WBC 29.4 (*)    RBC 2.48 (*)    Hemoglobin 7.3 (*)    HCT 23.7 (*)    RDW 15.8 (*)    nRBC 0.3 (*)    Neutro Abs 16.4 (*)    Lymphs Abs 7.5 (*)    Monocytes Absolute 2.8 (*)    Abs Immature Granulocytes 2.41 (*)    All other components within normal limits  PROTIME-INR - Abnormal; Notable for the following components:   Prothrombin Time 18.3 (*)    INR 1.5 (*)    All  other components within normal limits  LIPASE, BLOOD - Abnormal; Notable for the following components:   Lipase 121 (*)    All other components within normal limits  URINALYSIS, ROUTINE W REFLEX MICROSCOPIC  COMPREHENSIVE METABOLIC PANEL  CBC  HEMOGLOBIN  PREPARE RBC (CROSSMATCH)  TYPE AND SCREEN  TROPONIN I (HIGH SENSITIVITY)  TROPONIN I (HIGH SENSITIVITY)     I ordered and reviewed the above labs they are notable for white blood cell count is elevated at 29 she has a hemoglobin of 7.3  EKG  ED ECG REPORT I, Pilar Jarvis, the attending physician, personally viewed and interpreted this ECG.   Date: 04/29/2023  EKG Time: 1639  Rate: 99  Rhythm: sinus  Axis: LAD  Intervals:RBBB  ST&T Change: no stemi    RADIOLOGY I independently reviewed and interpreted chest x-ray and see no obvious focality pneumothorax   PROCEDURES:  Critical Care performed: Yes, see critical care procedure note(s)  .Critical Care  Performed by: Pilar Jarvis, MD Authorized by: Pilar Jarvis, MD   Critical care provider statement:    Critical care time (minutes):  30   Critical care was time spent personally by me on the following activities:  Development of treatment  plan with patient or surrogate, discussions with consultants, evaluation of patient's response to treatment, examination of patient, ordering and review of laboratory studies, ordering and review of radiographic studies, ordering and performing treatments and interventions, pulse oximetry, re-evaluation of patient's condition and review of old charts    MEDICATIONS ORDERED IN ED: Medications  0.9 %  sodium chloride infusion (has no administration in time range)  sodium chloride 0.9 % bolus 500 mL (has no administration in time range)  sodium bicarbonate tablet 650 mg (650 mg Oral Given 04/29/23 2322)  escitalopram (LEXAPRO) tablet 5 mg (has no administration in time range)  acetaminophen (TYLENOL) tablet 650 mg (has no administration in time range)    Or  acetaminophen (TYLENOL) suppository 650 mg (has no administration in time range)  ondansetron (ZOFRAN) tablet 4 mg (has no administration in time range)    Or  ondansetron (ZOFRAN) injection 4 mg (has no administration in time range)  lactated ringers infusion ( Intravenous New Bag/Given 04/29/23 2319)  HYDROcodone-acetaminophen (NORCO/VICODIN) 5-325 MG per tablet 1-2 tablet (has no administration in time range)  heparin injection 5,000 Units (5,000 Units Subcutaneous Given 04/29/23 2321)    External physician / consultants:  I spoke with hospitalist for admission and regarding care plan for this patient.   IMPRESSION / MDM / ASSESSMENT AND PLAN / ED COURSE  I reviewed the triage vital signs and the nursing notes.                                Patient's presentation is most consistent with acute presentation with potential threat to life or bodily function.  Differential diagnosis includes, but is not limited to, symptomatic anemia, acute GI bleed, anemia of chronic disease, infection, sepsis, renal failure   The patient is on the cardiac monitor to evaluate for evidence of arrhythmia and/or significant heart rate changes.  MDM:  This is a 89 year old chronically ill-appearing patient with refractory anemia got a blood transfusion just a couple weeks ago but is now anemic back to the same levels.  No evidence of GI bleeding.  Likely anemia of chronic disease.  Will get a blood transfusion for symptomatic anemia today.  Her creatinine is rapidly rising throughout  this month from 2 to now 6.  She is DNR.  Admission.       FINAL CLINICAL IMPRESSION(S) / ED DIAGNOSES   Final diagnoses:  Symptomatic anemia  Acute renal failure superimposed on chronic kidney disease, unspecified acute renal failure type, unspecified CKD stage (HCC)     Rx / DC Orders   ED Discharge Orders     None        Note:  This document was prepared using Dragon voice recognition software and may include unintentional dictation errors.    Pilar Jarvis, MD 04/30/23 [phone]

## 2023-04-30 DIAGNOSIS — D464 Refractory anemia, unspecified: Secondary | ICD-10-CM | POA: Diagnosis present

## 2023-04-30 DIAGNOSIS — Z91018 Allergy to other foods: Secondary | ICD-10-CM | POA: Diagnosis not present

## 2023-04-30 DIAGNOSIS — D631 Anemia in chronic kidney disease: Secondary | ICD-10-CM | POA: Diagnosis present

## 2023-04-30 DIAGNOSIS — R54 Age-related physical debility: Secondary | ICD-10-CM | POA: Diagnosis not present

## 2023-04-30 DIAGNOSIS — R64 Cachexia: Secondary | ICD-10-CM | POA: Diagnosis present

## 2023-04-30 DIAGNOSIS — Z7189 Other specified counseling: Secondary | ICD-10-CM

## 2023-04-30 DIAGNOSIS — R627 Adult failure to thrive: Secondary | ICD-10-CM | POA: Diagnosis present

## 2023-04-30 DIAGNOSIS — D72829 Elevated white blood cell count, unspecified: Secondary | ICD-10-CM | POA: Diagnosis not present

## 2023-04-30 DIAGNOSIS — Z681 Body mass index (BMI) 19 or less, adult: Secondary | ICD-10-CM | POA: Diagnosis not present

## 2023-04-30 DIAGNOSIS — F0390 Unspecified dementia without behavioral disturbance: Secondary | ICD-10-CM | POA: Diagnosis present

## 2023-04-30 DIAGNOSIS — K8044 Calculus of bile duct with chronic cholecystitis without obstruction: Secondary | ICD-10-CM | POA: Diagnosis present

## 2023-04-30 DIAGNOSIS — Z66 Do not resuscitate: Secondary | ICD-10-CM | POA: Diagnosis present

## 2023-04-30 DIAGNOSIS — Z515 Encounter for palliative care: Secondary | ICD-10-CM

## 2023-04-30 DIAGNOSIS — Z79899 Other long term (current) drug therapy: Secondary | ICD-10-CM | POA: Diagnosis not present

## 2023-04-30 DIAGNOSIS — N179 Acute kidney failure, unspecified: Secondary | ICD-10-CM | POA: Diagnosis present

## 2023-04-30 DIAGNOSIS — I120 Hypertensive chronic kidney disease with stage 5 chronic kidney disease or end stage renal disease: Secondary | ICD-10-CM | POA: Diagnosis present

## 2023-04-30 DIAGNOSIS — Z91012 Allergy to eggs: Secondary | ICD-10-CM | POA: Diagnosis not present

## 2023-04-30 DIAGNOSIS — G9349 Other encephalopathy: Secondary | ICD-10-CM | POA: Diagnosis present

## 2023-04-30 DIAGNOSIS — N184 Chronic kidney disease, stage 4 (severe): Secondary | ICD-10-CM | POA: Diagnosis present

## 2023-04-30 DIAGNOSIS — D649 Anemia, unspecified: Secondary | ICD-10-CM | POA: Diagnosis not present

## 2023-04-30 DIAGNOSIS — E8722 Chronic metabolic acidosis: Secondary | ICD-10-CM | POA: Diagnosis present

## 2023-04-30 DIAGNOSIS — N189 Chronic kidney disease, unspecified: Secondary | ICD-10-CM

## 2023-04-30 DIAGNOSIS — K9049 Malabsorption due to intolerance, not elsewhere classified: Secondary | ICD-10-CM | POA: Diagnosis present

## 2023-04-30 DIAGNOSIS — Z91011 Allergy to milk products: Secondary | ICD-10-CM | POA: Diagnosis not present

## 2023-04-30 LAB — GASTROINTESTINAL PANEL BY PCR, STOOL (REPLACES STOOL CULTURE)

## 2023-04-30 LAB — COMPREHENSIVE METABOLIC PANEL
ALT: 10 U/L (ref 0–44)
AST: 16 U/L (ref 15–41)
Albumin: 3.2 g/dL — ABNORMAL LOW (ref 3.5–5.0)
Alkaline Phosphatase: 97 U/L (ref 38–126)
Anion gap: 14 (ref 5–15)
BUN: 140 mg/dL — ABNORMAL HIGH (ref 8–23)
CO2: 14 mmol/L — ABNORMAL LOW (ref 22–32)
Calcium: 7.3 mg/dL — ABNORMAL LOW (ref 8.9–10.3)
Chloride: 105 mmol/L (ref 98–111)
Creatinine, Ser: 6.65 mg/dL — ABNORMAL HIGH (ref 0.44–1.00)
GFR, Estimated: 5 mL/min — ABNORMAL LOW (ref >60)
Glucose, Bld: 118 mg/dL — ABNORMAL HIGH (ref 70–99)
Potassium: 4.3 mmol/L (ref 3.5–5.1)
Sodium: 133 mmol/L — ABNORMAL LOW (ref 135–145)
Total Bilirubin: 0.9 mg/dL (ref 0.3–1.2)
Total Protein: 6.4 g/dL — ABNORMAL LOW (ref 6.5–8.1)

## 2023-04-30 LAB — CBC
HCT: 20.7 % — ABNORMAL LOW (ref 36.0–46.0)
Hemoglobin: 6.6 g/dL — ABNORMAL LOW (ref 12.0–15.0)
MCH: 29.3 pg (ref 26.0–34.0)
MCHC: 31.9 g/dL (ref 30.0–36.0)
MCV: 92 fL (ref 80.0–100.0)
Platelets: 208 10*3/uL (ref 150–400)
RBC: 2.25 MIL/uL — ABNORMAL LOW (ref 3.87–5.11)
RDW: 15.8 % — ABNORMAL HIGH (ref 11.5–15.5)
WBC: 25.7 10*3/uL — ABNORMAL HIGH (ref 4.0–10.5)
nRBC: 0.4 % — ABNORMAL HIGH (ref 0.0–0.2)

## 2023-04-30 LAB — TYPE AND SCREEN

## 2023-04-30 LAB — HEMOGLOBIN AND HEMATOCRIT, BLOOD
HCT: 27.5 % — ABNORMAL LOW (ref 36.0–46.0)
Hemoglobin: 9.2 g/dL — ABNORMAL LOW (ref 12.0–15.0)

## 2023-04-30 LAB — C DIFFICILE QUICK SCREEN W PCR REFLEX
C Diff antigen: NEGATIVE
C Diff interpretation: NOT DETECTED
C Diff toxin: NEGATIVE

## 2023-04-30 LAB — BPAM RBC
Blood Product Expiration Date: 202406122359
Blood Product Expiration Date: 202406142359
ISSUE DATE / TIME: 202405110207

## 2023-04-30 NOTE — Hospital Course (Addendum)
Taken from H&P.  Julia Kerr is a 89 y.o. female with medical history significant for CKD stage IIIb-IV, choledocholithiasis with chronic cholecystitis not amenable to surgery due to age, anemia of CKD on erythropoietin injections with frequent need for transfusions most recently 4/28 in the ED, who presents with a complaint of shortness of breath that started on the day of arrival.  Patient had a recent hospitalizationFrom 3/27 to 03/18/2023 for biliary colic that improved without surgical intervention.   ED course and data Review: Soft blood pressure of 101/54 with otherwise normal vitals.  Labs significant for WBC of 29.4, hemoglobin 7.3, stable from 7.1 a couple weeks prior though she did receive a unit of blood in the interim.  Creatinine 6.67 up from baseline of 1.78, with BUN 132, anion gap 18 and bicarb of 10.  LFTs were unremarkable. Troponin 17, Stool guaiac negative. EKG, shows NSR at 99 with no ischemic ST-T wave changes. Imaging: CT renal stone study was nonacute showing the following: IMPRESSION: 1. No cholelithiasis with no acute cholecystitis. 2. Colonic diverticulosis with no acute diverticulitis. 3. Stable splenomegaly. 4. Stable and L3 and L5 compression fractures. 5. Otherwise no acute intra-abdominal intrapelvic abnormality with limited evaluation on this noncontrast study.   Chest x-ray was nonacute Patient treated with an NS bolus and a unit of PRBCs ordered.   5/11: Vital stable.  Hemoglobin further decreased to 6.6-getting 1 unit of PRBC.  Renal function with slight worsening of BUN to 140 and creatinine stable at 6.67, anion gap improved.  Lipase elevated at 121, although no pancreatic abnormality or sign of inflammation noted on CT.  Palliative care met with son-he is leaning towards hospice but no final decision on comfort care at this point.  Received 1 unit of PRBC, pending labs for recheck of hemoglobin  5/12: Vital stable.  Posttransfusion hemoglobin increased to  9.2.  C. difficile and GI pathogen panel negative.  Some family discrepancy on proceeding with comfort focused care.  Another family meeting with palliative care today.  Son again backing off from comfort care on initial talk so we repeated labs with worsening renal function and hemoglobin 8.7.  He was very focused about getting second unit of blood but she does not need it.  Nephrology was also consulted at his request to explore dialysis options ??  5/13: Vital stable, developed some nausea and vomiting.  P.o. intake remains very poor.  Hemoglobin decreased to 7.9 and renal function seems stable with creatinine in 6.  Pending nephrology evaluation.  Another palliative care meeting planned for tomorrow as her second son is coming from [place].  Increasing the dose of bicarb, also added low-dose Dilaudid and Compazine as she was having some back pain and unable to tolerate p.o.

## 2023-04-30 NOTE — Consult Note (Signed)
Consultation Note Date: 04/30/2023   Patient Name: Julia Kerr  DOB: Apr 19, 1923  MRN: [id number]  Age / Sex: 89 y.o., female  PCP: System, Provider Not In Referring Physician: Arnetha Courser, MD  Reason for Consultation: Establishing goals of care   HPI/Brief Hospital Course: 89 y.o. female  with past medical history of CKD stage IIIb-IV, choledocholithiasis with chronic cholecystitis not amenable for surgery due to age, anemia of chronic disease (CKD) has been on erythropoietin injections with need for frequent blood transfusions most recently 4/28 in the ED admitted from home on 04/29/2023 with complaints of shortness of breath and weakness.   On arrival hemoglobin found to be 7.3 during her stay further decreased to 6.6 for which she was transfused 1 PRBC-repeat H&H pending Noted soft blood pressures Stool guaiac negative  Son-[name] notes diarrhea, N/V and poor PO intake contributing to AKI superimposed on CKD stage IIIb/IV-started on IV fluids  Noted 3 IP admissions and 2 ED visits in last 6 months  Palliative medicine was consulted for assisting with goals of care discussions.  Subjective:  Extensive chart review has been completed prior to meeting patient including labs, vital signs, imaging, progress notes, orders, and available advanced directive documents from current and previous encounters.  Introduced myself as a Publishing rights manager as a member of the palliative care team. Explained palliative medicine is specialized medical care for people living with serious illness. It focuses on providing relief from the symptoms and stress of a serious illness. The goal is to improve quality of life for both the patient and the family.   Visited with Julia Kerr at her bedside. In person medical interpreter present during visit. Initial visit earlier this AM with medical interpreter and son-[name] present at bedside. [name] with difficulty understanding and  being involved in conversation even with interpreter present, possible mild confusion at baseline.  Son-[name] shares that Julia Kerr has lived with him for the past few years and he has been her primary caretaker. Julia Kerr at baseline requires assistance with completing ADL's. She is an active [facility] patient and has personal care aids come into her home several times per week. [name] shares that his mother has struggled with ongoing anemia requiring frequent transfusions for well over a year. He shares that she has spoke with him on multiple occasions being tired of coming back and forth to the hospital and receiving recurrent transfusions. She has shared with him that she wishes to stay home and be comfortable and allow her to live out the time she has left at home with her family.  [name] shares he has spoken with [facility] program coordinator in the past about Hospice eligibility. Confusion around having [facility] and Hospice services concurrently-TOC aware and assisting with contacting [facility]. [name] shares that [facility] has provided medications to assist with nausea/vomiting but they were in agreement she was able to continue returning to the hospital for transfusions as needed.  Connected with Bing Ree, RN who has attempted to connect with [facility] unsuccessfully. Recommended [name] attempt to connect with his contact person through the program.  On return to room to share update with [name] he requested I have translator return as his sister is visiting at bedside and his brother-[name] available by phone.  Again with translator present, we reviewed current medical condition including chronic kidney disease and not being a candidate for dialysis. We reviewed expected chronic disease trajectory also further complicated by AKI. We reviewed chronic anemia and her ongoing need for recurrent blood transfusions.  We discussed blood transfusions as being a temporary solution to a chronic condition, meaning anemia will  continue and will likely worsen with time. Due to advanced age and multiple chronic conditions, [name]' prognosis is poor, time is limited with continuing or stopping interventions. We discussed the philosophy of hospice-transitioning focus to comfort. [name] and daughter in agreement shifting towards comfort is most appropriate. [name]-son struggling with this decision. [name] attempted multiple times to have Julia Kerr express her wishes which with translator present she voiced being tired and understanding her condition and wishes to be home and stay home to spend the time she has left with her family and working with her flowers.  [name] struggling with decision making with knowing the disconnect between him and his brother-[name]. [name] requests time for him to speak with all 5 of his sibling individually tonight and requests I return tomorrow for ongoing discussions.  At this time, no changes will be made to plan of care. Answered and addressed all questions and concerns.   Objective: Primary Diagnoses: Present on Admission:  Symptomatic anemia, suspect related to stage IIIb/IV chronic kidney disease  AKI (acute kidney injury) (HCC) superimposed on stage IIIb/IV chronic kidney disease  Leukocytosis, acute on chronic  Choledocholithiasis with chronic cholecystitis- not amenable to surgery due to age  Frailty   Physical Exam Constitutional:      General: She is not in acute distress.    Appearance: She is cachectic. She is ill-appearing.  Pulmonary:     Effort: Pulmonary effort is normal. No respiratory distress.  Skin:    General: Skin is warm and dry.     Findings: Bruising present.  Neurological:     Mental Status: She is alert.     Motor: Weakness present.     Vital Signs: BP (!) 121/53   Pulse 92   Temp (!) 97.1 F (36.2 C)   Resp 19   Ht 4\' 9"  (1.448 m)   Wt 31.8 kg   SpO2 100%   BMI 15.15 kg/m  Pain Scale: 0-10   Pain Score: 0-No pain  IO: Intake/output  summary:  Intake/Output Summary (Last 24 hours) at 04/30/2023 1455 Last data filed at 04/30/2023 7829 Gross per 24 hour  Intake 1139.59 ml  Output --  Net 1139.59 ml    LBM: Last BM Date : 04/29/23 Baseline Weight: Weight: 31.8 kg Most recent weight: Weight: 31.8 kg      Assessment and Plan  SUMMARY OF RECOMMENDATIONS   DNR Continue current plan of care Ongoing GOC needed-planned meeting tomorrow for further discussions  Discussed With: Primary team   Thank you for this consult and allowing Palliative Medicine to participate in the care of [facility]. Palliative medicine will continue to follow and assist as needed.   Time Total: 90 minutes  Time spent includes: Detailed review of medical records (labs, imaging, vital signs), medically appropriate exam (mental status, respiratory, cardiac, skin), discussed with treatment team, counseling and educating patient, family and staff, documenting clinical information, medication management and coordination of care.   Signed by: Leeanne Deed, DNP, AGNP-C Palliative Medicine    Please contact Palliative Medicine Team phone at [phone] for questions and concerns.  For individual provider: See Loretha Stapler

## 2023-04-30 NOTE — Progress Notes (Signed)
Mobility Specialist - Progress Note     04/30/23 1026  Mobility  Activity Transferred to/from BSC;Dangled on edge of bed;Ambulated with assistance in room  Level of Assistance Minimal assist, patient does 75% or more  Assistive Device Front wheel walker  Distance Ambulated (ft) 3 ft  Range of Motion/Exercises Active  Activity Response Tolerated well  $Mobility charge 1 Mobility  Mobility Specialist Start Time (ACUTE ONLY) 0945  Mobility Specialist Stop Time (ACUTE ONLY) 1015  Mobility Specialist Time Calculation (min) (ACUTE ONLY) 30 min   Pt resting in bed on RA upon entry. Pt STS and transfers to [facility] for BM. Pt hygiene/cleaned by NT and pt took 4-5 steps forward with RW and returned to bed (pivot turn). Pt left in bed with needs in reach and bed alarm activated. Pallative care entering upon exit.   Johnathan Hausen Mobility Specialist 04/30/23, 10:42 AM

## 2023-04-30 NOTE — Progress Notes (Signed)
Progress Note   Patient: Julia Kerr:[id number] DOB: [birth year] DOA: 04/29/2023     0 DOS: the patient was seen and examined on 04/30/2023   Brief hospital course: Taken from H&P.  Julia Kerr is a 89 y.o. female with medical history significant for CKD stage IIIb-IV, choledocholithiasis with chronic cholecystitis not amenable to surgery due to age, anemia of CKD on erythropoietin injections with frequent need for transfusions most recently 4/28 in the ED, who presents with a complaint of shortness of breath that started on the day of arrival.  Patient had a recent hospitalizationFrom 3/27 to 03/18/2023 for biliary colic that improved without surgical intervention.   ED course and data Review: Soft blood pressure of 101/54 with otherwise normal vitals.  Labs significant for WBC of 29.4, hemoglobin 7.3, stable from 7.1 a couple weeks prior though she did receive a unit of blood in the interim.  Creatinine 6.67 up from baseline of 1.78, with BUN 132, anion gap 18 and bicarb of 10.  LFTs were unremarkable. Troponin 17, Stool guaiac negative. EKG, shows NSR at 99 with no ischemic ST-T wave changes. Imaging: CT renal stone study was nonacute showing the following: IMPRESSION: 1. No cholelithiasis with no acute cholecystitis. 2. Colonic diverticulosis with no acute diverticulitis. 3. Stable splenomegaly. 4. Stable and L3 and L5 compression fractures. 5. Otherwise no acute intra-abdominal intrapelvic abnormality with limited evaluation on this noncontrast study.   Chest x-ray was nonacute Patient treated with an NS bolus and a unit of PRBCs ordered.   5/11: Vital stable.  Hemoglobin further decreased to 6.6-getting 1 unit of PRBC.  Renal function with slight worsening of BUN to 140 and creatinine stable at 6.67, anion gap improved.  Lipase elevated at 121, although no pancreatic abnormality or sign of inflammation noted on CT.  Palliative care met with son-he is leaning towards hospice but no  final decision on comfort care at this point.  Received 1 unit of PRBC, pending labs for recheck of hemoglobin   Assessment and Plan: * Symptomatic anemia, suspect related to stage IIIb/IV chronic kidney disease Patient is chronically anemic and receives erythropoietin and as needed blood transfusions, most recently 4/28 No evidence of acute blood loss.  Stool guaiac negative Anemia secondary to CKD and chronic illness. Received 1 unit of PRBC-pending repeat check Palliative care was consulted-son is now leaning more toward hospice-patient is currently paced patient and need to clarify from them.  No final decision about comfort care but most likely be started soon, another palliative care meeting tomorrow  AKI (acute kidney injury) (HCC) superimposed on stage IIIb/IV chronic kidney disease High anion gap metabolic acidosis Secondary to fluid losses from diarrhea and intermittent vomiting as well as poor oral intake.  Anion gap improved. Creatinine 6.67 up from baseline of 1.78 -IV hydration -Continue oral bicarb - Monitor renal function and avoid nephrotoxins -Patient will not be a candidate for dialysis  Choledocholithiasis with chronic cholecystitis- not amenable to surgery due to age Diarrhea with intermittent nausea and vomiting LFTs WNL and abdominal exam benign - Add on lipase -Stool studies-negative -Supportive care -Patient is not a surgical candidate   Leukocytosis, acute on chronic Possibly reactive CT abdomen and pelvis was nonacute with no acute cholecystitis - Continue to monitor WBCs - Holding off on antibiotics for now - GI panel and stool for C. Difficile-negative  Frailty Suspect severe protein calorie malnutrition - Nutritionist evaluation as patient has very poor intake due to food intolerance related to her gallbladder issues  Subjective: Patient was seen and examined today.  No family at bedside.  She was unable to tell me her name.  Very frail  and emaciated elderly lady with significant dementia.  Physical Exam: Vitals:   04/30/23 0217 04/30/23 0236 04/30/23 0527 04/30/23 0832  BP: (!) 104/46 (!) 102/50 (!) 114/50 (!) 121/53  Pulse: 88 87 85 92  Resp: 18 18 18 19   Temp: (!) 97.5 F (36.4 C) 97.6 F (36.4 C) 97.7 F (36.5 C) (!) 97.1 F (36.2 C)  TempSrc: Oral Oral Oral   SpO2: 100% 100% 100% 100%  Weight:      Height:       General.  Frail and emaciated elderly lady, in no acute distress. Pulmonary.  Lungs clear bilaterally, normal respiratory effort. CV.  Regular rate and rhythm, no JVD, rub or murmur. Abdomen.  Soft, nontender, nondistended, BS positive. CNS.  Alert  .  No apparent focal neurologic deficit. Extremities.  No edema, no cyanosis, pulses intact and symmetrical. Psychiatry.  Judgment and insight appears impaired  Data Reviewed: Prior data reviewed  Family Communication: Talked with son on phone.  Disposition: Status is: Inpatient Remains inpatient appropriate because: Severity of illness  Planned Discharge Destination:  To be determined  DVT prophylaxis.  Subcu heparin Time spent: 50 minutes  This record has been created using Conservation officer, historic buildings. Errors have been sought and corrected,but may not always be located. Such creation errors do not reflect on the standard of care.   Author: Arnetha Courser, MD 04/30/2023 2:29 PM  For on call review [email or web address].

## 2023-04-30 NOTE — TOC Progression Note (Signed)
Transition of Care [facility]) - Progression Note    Patient Details  Name: Julia Kerr MRN: [id number] Date of Birth: 1923-02-10  [facility] CM/SW Contact  Bing Quarry, RN Phone Number: 04/30/2023, 2:52 PM  Clinical Narrative: 04/30/23: Per Palliative NP, patient's son is interested in transitioning to comfort care/hospice but was unsure about [facility] insurance interface or options. Contacted [facility] and Victorino Dike who answered said, yes, they cover that transition, took last name and DOB. Then she placed RN CM was on hold for 16 minutes before hanging up or call was disconnected. RN CM  let Palliative know and will attempt to re-contact or see if she calls back.  Palliative NP will reach out to son to update and have him try to contact [facility] possibly from his end. Gabriel Cirri RN CM          Expected Discharge Plan and Services                                               Social Determinants of Health (SDOH) Interventions SDOH Screenings   Food Insecurity: No Food Insecurity (04/29/2023)  Housing: Low Risk  (04/30/2023)  Transportation Needs: No Transportation Needs (04/30/2023)  Utilities: Not At Risk (04/30/2023)  Depression (PHQ2-9): Low Risk  (04/12/2022)  Tobacco Use: Low Risk  (04/29/2023)    Readmission Risk Interventions     No data to display

## 2023-04-30 NOTE — Progress Notes (Signed)
As per son, Julia Kerr, patient is lactose intolerant.

## 2023-05-01 DIAGNOSIS — R54 Age-related physical debility: Secondary | ICD-10-CM | POA: Diagnosis not present

## 2023-05-01 DIAGNOSIS — D72829 Elevated white blood cell count, unspecified: Secondary | ICD-10-CM | POA: Diagnosis not present

## 2023-05-01 DIAGNOSIS — D649 Anemia, unspecified: Secondary | ICD-10-CM | POA: Diagnosis not present

## 2023-05-01 DIAGNOSIS — Z7189 Other specified counseling: Secondary | ICD-10-CM | POA: Diagnosis not present

## 2023-05-01 DIAGNOSIS — N179 Acute kidney failure, unspecified: Secondary | ICD-10-CM | POA: Diagnosis not present

## 2023-05-01 LAB — CBC
HCT: 26 % — ABNORMAL LOW (ref 36.0–46.0)
Hemoglobin: 8.7 g/dL — ABNORMAL LOW (ref 12.0–15.0)
MCH: 30.2 pg (ref 26.0–34.0)
MCHC: 33.5 g/dL (ref 30.0–36.0)
MCV: 90.3 fL (ref 80.0–100.0)
Platelets: 198 10*3/uL (ref 150–400)
RBC: 2.88 MIL/uL — ABNORMAL LOW (ref 3.87–5.11)
RDW: 15.9 % — ABNORMAL HIGH (ref 11.5–15.5)
WBC: 26 10*3/uL — ABNORMAL HIGH (ref 4.0–10.5)
nRBC: 0.2 % (ref 0.0–0.2)

## 2023-05-01 LAB — BASIC METABOLIC PANEL
Anion gap: 16 — ABNORMAL HIGH (ref 5–15)
BUN: 134 mg/dL — ABNORMAL HIGH (ref 8–23)
CO2: 10 mmol/L — ABNORMAL LOW (ref 22–32)
Calcium: 7.1 mg/dL — ABNORMAL LOW (ref 8.9–10.3)
Chloride: 110 mmol/L (ref 98–111)
Creatinine, Ser: 7.25 mg/dL — ABNORMAL HIGH (ref 0.44–1.00)
GFR, Estimated: 5 mL/min — ABNORMAL LOW (ref >60)
Glucose, Bld: 78 mg/dL (ref 70–99)
Potassium: 4.5 mmol/L (ref 3.5–5.1)
Sodium: 136 mmol/L (ref 135–145)

## 2023-05-01 NOTE — Assessment & Plan Note (Signed)
Patient is chronically anemic and receives erythropoietin and as needed blood transfusions, most recently 4/28 No evidence of acute blood loss.  Stool guaiac negative Anemia secondary to CKD and chronic illness. Received 1 unit of PRBC-hemoglobin improved to 9.2 >>8.7>>7.9 today Palliative care was consulted-another family meeting planned for tomorrow after her other son is here.  Initial plan was to comfort focused care with hospice but now son also wants to explore options with nephrology-pending consult.

## 2023-05-01 NOTE — Progress Notes (Signed)
Patient's son called, asking for very specific questions and explanations. Will make MD aware.

## 2023-05-01 NOTE — Progress Notes (Signed)
Progress Note   Patient: Julia Kerr ZOX:[id number] DOB: 08/11/1923 DOA: 04/29/2023     1 DOS: the patient was seen and examined on 05/01/2023   Brief hospital course: Taken from H&P.  Julia Kerr is a 89 y.o. female with medical history significant for CKD stage IIIb-IV, choledocholithiasis with chronic cholecystitis not amenable to surgery due to age, anemia of CKD on erythropoietin injections with frequent need for transfusions most recently 4/28 in the ED, who presents with a complaint of shortness of breath that started on the day of arrival.  Patient had a recent hospitalizationFrom 3/27 to 03/18/2023 for biliary colic that improved without surgical intervention.   ED course and data Review: Soft blood pressure of 101/54 with otherwise normal vitals.  Labs significant for WBC of 29.4, hemoglobin 7.3, stable from 7.1 a couple weeks prior though she did receive a unit of blood in the interim.  Creatinine 6.67 up from baseline of 1.78, with BUN 132, anion gap 18 and bicarb of 10.  LFTs were unremarkable. Troponin 17, Stool guaiac negative. EKG, shows NSR at 99 with no ischemic ST-T wave changes. Imaging: CT renal stone study was nonacute showing the following: IMPRESSION: 1. No cholelithiasis with no acute cholecystitis. 2. Colonic diverticulosis with no acute diverticulitis. 3. Stable splenomegaly. 4. Stable and L3 and L5 compression fractures. 5. Otherwise no acute intra-abdominal intrapelvic abnormality with limited evaluation on this noncontrast study.   Chest x-ray was nonacute Patient treated with an NS bolus and a unit of PRBCs ordered.   5/11: Vital stable.  Hemoglobin further decreased to 6.6-getting 1 unit of PRBC.  Renal function with slight worsening of BUN to 140 and creatinine stable at 6.67, anion gap improved.  Lipase elevated at 121, although no pancreatic abnormality or sign of inflammation noted on CT.  Palliative care met with son-he is leaning towards hospice but no  final decision on comfort care at this point.  Received 1 unit of PRBC, pending labs for recheck of hemoglobin  5/12: Vital stable.  Posttransfusion hemoglobin increased to 9.2.  C. difficile and GI pathogen panel negative.  Some family discrepancy on proceeding with comfort focused care.  Another family meeting with palliative care today.  Son again backing off from comfort care on initial talk so we repeated labs with worsening renal function and hemoglobin 8.7.  He was very focused about getting second unit of blood but she does not need it.  Nephrology was also consulted at his request to explore dialysis options ??   Assessment and Plan: * Symptomatic anemia, suspect related to stage IIIb/IV chronic kidney disease Patient is chronically anemic and receives erythropoietin and as needed blood transfusions, most recently 4/28 No evidence of acute blood loss.  Stool guaiac negative Anemia secondary to CKD and chronic illness. Received 1 unit of PRBC-hemoglobin improved to 9.2 >>8.7 today Palliative care was consulted-son is now leaning more toward hospice-patient is currently paced patient and need to clarify from them.  No final decision about comfort care but most likely be started soon, another palliative care meeting later today but now he wants to involve nephrology due to worsening renal function  AKI (acute kidney injury) (HCC) superimposed on stage IIIb/IV chronic kidney disease High anion gap metabolic acidosis Secondary to fluid losses from diarrhea and intermittent vomiting as well as poor oral intake.  Anion gap improved. Creatinine 6.67>>7.25, up from baseline of 1.78 -IV hydration-renal functions continue to get worse -Continue oral bicarb - Monitor renal function and avoid nephrotoxins -  Patient will not be a candidate for dialysis, nephrology was consulted at son's request  Choledocholithiasis with chronic cholecystitis- not amenable to surgery due to age Diarrhea with  intermittent nausea and vomiting LFTs WNL and abdominal exam benign - Add on lipase -Stool studies-negative -Supportive care -Patient is not a surgical candidate   Leukocytosis, acute on chronic Possibly reactive CT abdomen and pelvis was nonacute with no acute cholecystitis - Continue to monitor WBCs - Holding off on antibiotics for now - GI panel and stool for C. Difficile-negative  Frailty Suspect severe protein calorie malnutrition - Nutritionist evaluation as patient has very poor intake due to food intolerance related to her gallbladder issues       Subjective: Patient was sleeping when seen today,easily arousable but drifting back to sleep quickly.  Physical Exam: Vitals:   04/30/23 1657 04/30/23 2012 05/01/23 0426 05/01/23 0842  BP: (!) 125/55 138/62 (!) 141/60 (!) 107/47  Pulse: 92 91 92 97  Resp: 19 20 20 16   Temp: 97.7 F (36.5 C) (!) 97.5 F (36.4 C) (!) 97.4 F (36.3 C) 97.9 F (36.6 C)  TempSrc:  Oral Oral   SpO2: 100% 100% 100% 99%  Weight:      Height:       General.  Frail and emaciated elderly lady, in no acute distress. Pulmonary.  Lungs clear bilaterally, normal respiratory effort. CV.  Regular rate and rhythm, no JVD, rub or murmur. Abdomen.  Soft, nontender, nondistended, BS positive. CNS.  Somnolent and lethargic Extremities.  No edema, no cyanosis, pulses intact and symmetrical. Psychiatry.  Judgment and insight appears impaired.   Data Reviewed: Prior data reviewed  Family Communication: Talked with son on phone.  Disposition: Status is: Inpatient Remains inpatient appropriate because: Severity of illness  Planned Discharge Destination:  To be determined  DVT prophylaxis.  Subcu heparin Time spent: 45 minutes  This record has been created using Conservation officer, historic buildings. Errors have been sought and corrected,but may not always be located. Such creation errors do not reflect on the standard of care.   Author: Arnetha Courser, MD 05/01/2023 3:01 PM  For on call review [email or web address].

## 2023-05-01 NOTE — Progress Notes (Signed)
Daily Progress Note   Patient Name: Julia Kerr       Date: 05/01/2023 DOB: 1923/11/03  Age: 89 y.o. MRN#: [id number] Attending Physician: Julia Courser, MD Primary Care Physician: System, Provider Not In Admit Date: 04/29/2023  Reason for Consultation/Follow-up: Establishing goals of care  HPI/Brief Hospital Review:  89 y.o. female  with past medical history of CKD stage IIIb-IV, choledocholithiasis with chronic cholecystitis not amenable for surgery due to age, anemia of chronic disease (CKD) has been on erythropoietin injections with need for frequent blood transfusions most recently 4/28 in the ED admitted from home on 04/29/2023 with complaints of shortness of breath and weakness.    On arrival hemoglobin found to be 7.3 during her stay further decreased to 6.6 for which she was transfused 1 PRBC-repeat H&H pending Noted soft blood pressures Stool guaiac negative   Son-Julia Kerr notes diarrhea, N/V and poor PO intake contributing to AKI superimposed on CKD stage IIIb/IV-started on IV fluids   Noted 3 IP admissions and 2 ED visits in last 6 months   Palliative medicine was consulted for assisting with goals of care discussions.  Subjective: Extensive chart review has been completed prior to meeting patient including labs, vital signs, imaging, progress notes, orders, and available advanced directive documents from current and previous encounters.    Visited with Julia Kerr at her bedside. Resting with eyes closed, does not acknowledge my presence. Son-Julia Kerr at bedside shares she has been less interactive today and resting more.  Julia Kerr shares he was able to speak with all of his siblings overnight. His brother-Julia Kerr is planning to visit tomorrow from [place], he feels it will be better to  have ongoing GOC conversations with Julia Kerr present in person. HE requests PMT assistance in communicating with his brother and other family for ongoing GOC discussions. He is unsure what time Julia Kerr will arrive but encouraged to reach out to PMT when a time has been established.  Objective:        Vital Signs: BP (!) 117/47 (BP Location: Right Arm)   Pulse (!) 103   Temp (!) 97.3 F (36.3 C)   Resp 18   Ht 4\' 9"  (1.448 m)   Wt 31.8 kg   SpO2 100%   BMI 15.15 kg/m  SpO2: SpO2: 100 % O2 Device: O2 Device: Room ONEOK  O2 Flow Rate:     Palliative Care Assessment & Plan   Assessment/Recommendation/Plan  DNR Anticipated family meeting tomorrow once son-Julia Kerr arrives, Julia Kerr to connect with PMT once time has been set for his arrival PMT to continue to follow for ongoing needs and support  Thank you for allowing the Palliative Medicine Team to assist in the care of this patient.  Total time:  25 minutes  Time spent includes: Detailed review of medical records (labs, imaging, vital signs), medically appropriate exam (mental status, respiratory, cardiac, skin), discussed with treatment team, counseling and educating patient, family and staff, documenting clinical information, medication management and coordination of care.  Julia Deed, DNP, AGNP-C Palliative Medicine   Please contact Palliative Medicine Team phone at [phone] for questions and concerns.

## 2023-05-01 NOTE — Assessment & Plan Note (Signed)
High anion gap metabolic acidosis Secondary to fluid losses from diarrhea and intermittent vomiting as well as poor oral intake.  Anion gap improved. Creatinine 6.67>>7.25>>6.86, GFR remained the same at 5, up from baseline of 1.78 -IV hydration- -Continue oral bicarb-dose was increased today - Monitor renal function and avoid nephrotoxins -Patient will not be a candidate for dialysis, nephrology was consulted at son's request

## 2023-05-02 ENCOUNTER — Ambulatory Visit
Admission: RE | Admit: 2023-05-02 | Discharge: 2023-05-02 | Disposition: A | Discharge status: Home / Self Care | Payer: Medicare (Managed Care) | Source: Ambulatory Visit | Attending: Vascular Surgery | Admitting: Vascular Surgery

## 2023-05-02 ENCOUNTER — Encounter (HOSPITAL_COMMUNITY): Payer: Self-pay

## 2023-05-02 DIAGNOSIS — Z7189 Other specified counseling: Secondary | ICD-10-CM | POA: Diagnosis not present

## 2023-05-02 DIAGNOSIS — R54 Age-related physical debility: Secondary | ICD-10-CM | POA: Diagnosis not present

## 2023-05-02 DIAGNOSIS — D72829 Elevated white blood cell count, unspecified: Secondary | ICD-10-CM | POA: Diagnosis not present

## 2023-05-02 DIAGNOSIS — D649 Anemia, unspecified: Secondary | ICD-10-CM | POA: Diagnosis not present

## 2023-05-02 DIAGNOSIS — D631 Anemia in chronic kidney disease: Secondary | ICD-10-CM

## 2023-05-02 DIAGNOSIS — N179 Acute kidney failure, unspecified: Secondary | ICD-10-CM | POA: Diagnosis not present

## 2023-05-02 LAB — BASIC METABOLIC PANEL
Anion gap: 15 (ref 5–15)
BUN: 128 mg/dL — ABNORMAL HIGH (ref 8–23)
CO2: 12 mmol/L — ABNORMAL LOW (ref 22–32)
Calcium: 6.9 mg/dL — ABNORMAL LOW (ref 8.9–10.3)
Chloride: 110 mmol/L (ref 98–111)
Creatinine, Ser: 6.86 mg/dL — ABNORMAL HIGH (ref 0.44–1.00)
GFR, Estimated: 5 mL/min — ABNORMAL LOW (ref >60)
Glucose, Bld: 94 mg/dL (ref 70–99)
Potassium: 4.3 mmol/L (ref 3.5–5.1)
Sodium: 137 mmol/L (ref 135–145)

## 2023-05-02 LAB — CBC
HCT: 24 % — ABNORMAL LOW (ref 36.0–46.0)
Hemoglobin: 7.9 g/dL — ABNORMAL LOW (ref 12.0–15.0)
MCH: 29.6 pg (ref 26.0–34.0)
MCHC: 32.9 g/dL (ref 30.0–36.0)
MCV: 89.9 fL (ref 80.0–100.0)
Platelets: 180 10*3/uL (ref 150–400)
RBC: 2.67 MIL/uL — ABNORMAL LOW (ref 3.87–5.11)
RDW: 16.1 % — ABNORMAL HIGH (ref 11.5–15.5)
WBC: 21 10*3/uL — ABNORMAL HIGH (ref 4.0–10.5)
nRBC: 0.2 % (ref 0.0–0.2)

## 2023-05-02 LAB — TYPE AND SCREEN

## 2023-05-02 LAB — BPAM RBC
Unit Type and Rh: 5100
Unit Type and Rh: 6200

## 2023-05-02 MED ORDER — PROCHLORPERAZINE EDISYLATE 10 MG/2ML IJ SOLN: 5.0000 mg | Freq: Four times a day (QID) | INTRAMUSCULAR | Status: DC | PRN

## 2023-05-02 MED ORDER — ALUM & MAG HYDROXIDE-SIMETH 200-200-20 MG/5ML PO SUSP
15.0000 mL | ORAL | Status: DC | PRN
  Administered 2023-05-02: 15 mL via ORAL
  Filled 2023-05-02: qty 30

## 2023-05-02 MED ORDER — SODIUM BICARBONATE 650 MG PO TABS
1300.0000 mg | ORAL_TABLET | Freq: Two times a day (BID) | ORAL | Status: DC
  Filled 2023-05-02: qty 2

## 2023-05-02 MED ORDER — KATE FARMS STANDARD 1.4 PO LIQD: 325.0000 mL | Freq: Two times a day (BID) | ORAL | Status: DC

## 2023-05-02 MED ORDER — ADULT MULTIVITAMIN W/MINERALS CH
1.0000 | ORAL_TABLET | Freq: Every day | ORAL | Status: DC
  Filled 2023-05-02: qty 1

## 2023-05-02 MED ORDER — HYDROMORPHONE HCL 1 MG/ML IJ SOLN
0.5000 mg | INTRAMUSCULAR | Status: DC | PRN
  Administered 2023-05-02: 0.5 mg via INTRAVENOUS
  Filled 2023-05-02: qty 0.5

## 2023-05-02 NOTE — Progress Notes (Addendum)
Daily Progress Note   Patient Name: Julia Kerr       Date: 05/02/2023 DOB: [birth year]  Age: 89 y.o. MRN#: [id number] Attending Physician: Arnetha Courser, MD Primary Care Physician: System, Provider Not In Admit Date: 04/29/2023  Reason for Consultation/Follow-up: Establishing goals of care  Subjective: Notes and labs reviewed. In to see patient.  Son [name] is at bedside.  He states he is H POA.  Patient is resting in bed in with eyes closed.  Family discusses his brother coming into town tomorrow.  The family then plans for brother to be able to sit and spend time with the patient at bedside.  He states his sister is already here.  He states that they have an additional sister who is estranged from patient and her care.  We discussed her diagnosis, prognosis, GOC, EOL wishes disposition and options.  Created space and opportunity for patient  to explore thoughts and feelings regarding current medical information.   A detailed discussion was had today regarding advanced directives.  Concepts specific to code status, artifical feeding and hydration, IV antibiotics and rehospitalization were discussed.  The difference between an aggressive medical intervention path and a comfort care path was discussed.  Values and goals of care important to patient and family were attempted to be elicited.  Discussed limitations of medical interventions to prolong quality of life in some situations and discussed the concept of human mortality.  We discussed pain and suffering.  We discussed various scenarios.  He states his mother has been clear that "she is tired of being stuck with needles, and is ready to be done with all of this".  Discussed speaking further with family tomorrow.  Spoke with nephrology and  attending.  Length of Stay: 2  Current Medications: Scheduled Meds:   escitalopram  5 mg Oral Daily   heparin injection (subcutaneous)  5,000 Units Subcutaneous Q8H   sodium bicarbonate  1,300 mg Oral BID    Continuous Infusions:  lactated ringers 100 mL/hr at 05/02/23 0842    PRN Meds: acetaminophen **OR** acetaminophen, alum & mag hydroxide-simeth, HYDROcodone-acetaminophen, ondansetron **OR** ondansetron (ZOFRAN) IV  Physical Exam Constitutional:      Comments: Eyes closed.  Pulmonary:     Effort: Pulmonary effort is normal.  Vital Signs: BP (!) 124/54   Pulse 96   Temp 98.1 F (36.7 C)   Resp 16   Ht 4\' 9"  (1.448 m)   Wt 31.8 kg   SpO2 100%   BMI 15.15 kg/m  SpO2: SpO2: 100 % O2 Device: O2 Device: Room Air O2 Flow Rate:    Intake/output summary:  Intake/Output Summary (Last 24 hours) at 05/02/2023 1142 Last data filed at 05/02/2023 0805 Gross per 24 hour  Intake 988.89 ml  Output --  Net 988.89 ml   LBM: Last BM Date : 05/01/23 Baseline Weight: Weight: 31.8 kg Most recent weight: Weight: 31.8 kg         Patient Active Problem List   Diagnosis Date Noted   Leukocytosis, acute on chronic 04/29/2023   Frailty 04/29/2023   High anion gap metabolic acidosis 04/29/2023   Nausea vomiting and diarrhea 03/16/2023   Electrolyte abnormality 03/16/2023   Hyponatremia 01/29/2023   Depression 01/29/2023   Pressure injury of skin 01/29/2023   Thrombocytopenia (HCC) 01/29/2023   Hypomagnesemia 01/29/2023   CAP (community acquired pneumonia) 01/28/2023   Protein-calorie malnutrition, severe 08/04/2022   Anemia of chronic kidney failure, stage 4 (severe) (HCC) 08/03/2022   AKI (acute kidney injury) (HCC) superimposed on stage IIIb/IV chronic kidney disease 08/03/2022   Axillary lymphadenopathy 03/19/2022   History of pelvic fracture 12/2021 03/18/2022   Fracture of superior pubic ramus (HCC) 02/15/2022   Multiple closed pelvic fractures without  disruption of pelvic ring (HCC) 01/18/2022   Multiple closed pelvic fractures without disruption of pelvic circle (HCC) 01/17/2022   Anemia of chronic renal failure, stage 3b (HCC) 01/17/2022   Seasonal allergic rhinitis 04/12/2021   Left sided sciatica 04/12/2021   Chronic rhinitis 02/06/2020   Adverse food reaction 02/06/2020   Vaccine counseling 02/06/2020   Cellulitis of lower extremity 08/18/2019   Insect bite 08/01/2019   Low serum thyroid stimulating hormone (TSH) 08/17/2018   Symptomatic anemia, suspect related to stage IIIb/IV chronic kidney disease 08/17/2018   Stage 3b chronic kidney disease (CKD) (HCC) 08/17/2018   Dehydration, moderate 08/17/2018   Low HDL (under 40) 08/17/2018   Vitamin D deficiency 08/04/2018   B12 deficiency-requiring injections in past 08/04/2018   Bruit of right carotid artery 08/04/2018   Choledocholithiasis with chronic cholecystitis- not amenable to surgery due to age 84/16/2019   Biliary colic symptom- intolerant to dairy and fats due to gallstone presence 08/04/2018   Environmental and seasonal allergies 08/04/2018   Postmenopausal bone loss 08/04/2018   Chronic fatigue- with B12 def 08/04/2018    Palliative Care Assessment & Plan   Recommendations/Plan: Discussed son bringing H POA papers. Other son coming in to town tomorrow. PMT will follow    Code Status:    Code Status Orders  (From admission, onward)           Start     Ordered   04/29/23 2133  Do not attempt resuscitation (DNR)  Continuous       Question Answer Comment  If patient has no pulse and is not breathing Do Not Attempt Resuscitation   If patient has a pulse and/or is breathing: Medical Treatment Goals LIMITED ADDITIONAL INTERVENTIONS: Use medication/IV fluids and cardiac monitoring as indicated; Do not use intubation or mechanical ventilation (DNI), also provide comfort medications.  Transfer to Progressive/Stepdown as indicated, avoid Intensive Care.   Consent:  Discussion documented in EHR or advanced directives reviewed      04/29/23 2133  Code Status History     Date Active Date Inactive Code Status Order ID Comments User Context   03/16/2023 2006 03/18/2023 1817 DNR [id number]  Gertha Calkin, MD ED   03/16/2023 1858 03/16/2023 2006 DNR [id number]  Willy Eddy, MD ED   01/30/2023 1104 01/30/2023 1817 DNR [id number]  Marrion Coy, MD Inpatient   01/28/2023 2348 01/30/2023 1104 Full Code [id number]  Mansy, Vernetta Honey, MD ED   08/03/2022 1901 08/05/2022 2209 Full Code [id number]  Cox, Amy N, DO ED   03/18/2022 2011 03/19/2022 1856 Full Code [id number]  Andris Baumann, MD ED   02/19/2022 2134 02/20/2022 2129 Full Code [id number]  Gertha Calkin, MD ED   01/17/2022 1249 01/19/2022 2324 Full Code [id number]  Jonah Blue, MD ED       Prognosis: Poor overall   Care plan was discussed with nephrology  Thank you for allowing the Palliative Medicine Team to assist in the care of this patient.    Morton Stall, NP  Please contact Palliative Medicine Team phone at [phone] for questions and concerns.

## 2023-05-02 NOTE — Progress Notes (Signed)
Progress Note   Patient: Julia Kerr ZOX:[id number] DOB: 01-21-1923 DOA: 04/29/2023     2 DOS: the patient was seen and examined on 05/02/2023   Brief hospital course: Taken from H&P.  Shakora Vance is a 89 y.o. female with medical history significant for CKD stage IIIb-IV, choledocholithiasis with chronic cholecystitis not amenable to surgery due to age, anemia of CKD on erythropoietin injections with frequent need for transfusions most recently 4/28 in the ED, who presents with a complaint of shortness of breath that started on the day of arrival.  Patient had a recent hospitalizationFrom 3/27 to 03/18/2023 for biliary colic that improved without surgical intervention.   ED course and data Review: Soft blood pressure of 101/54 with otherwise normal vitals.  Labs significant for WBC of 29.4, hemoglobin 7.3, stable from 7.1 a couple weeks prior though she did receive a unit of blood in the interim.  Creatinine 6.67 up from baseline of 1.78, with BUN 132, anion gap 18 and bicarb of 10.  LFTs were unremarkable. Troponin 17, Stool guaiac negative. EKG, shows NSR at 99 with no ischemic ST-T wave changes. Imaging: CT renal stone study was nonacute showing the following: IMPRESSION: 1. No cholelithiasis with no acute cholecystitis. 2. Colonic diverticulosis with no acute diverticulitis. 3. Stable splenomegaly. 4. Stable and L3 and L5 compression fractures. 5. Otherwise no acute intra-abdominal intrapelvic abnormality with limited evaluation on this noncontrast study.   Chest x-ray was nonacute Patient treated with an NS bolus and a unit of PRBCs ordered.   5/11: Vital stable.  Hemoglobin further decreased to 6.6-getting 1 unit of PRBC.  Renal function with slight worsening of BUN to 140 and creatinine stable at 6.67, anion gap improved.  Lipase elevated at 121, although no pancreatic abnormality or sign of inflammation noted on CT.  Palliative care met with son-he is leaning towards hospice but no  final decision on comfort care at this point.  Received 1 unit of PRBC, pending labs for recheck of hemoglobin  5/12: Vital stable.  Posttransfusion hemoglobin increased to 9.2.  C. difficile and GI pathogen panel negative.  Some family discrepancy on proceeding with comfort focused care.  Another family meeting with palliative care today.  Son again backing off from comfort care on initial talk so we repeated labs with worsening renal function and hemoglobin 8.7.  He was very focused about getting second unit of blood but she does not need it.  Nephrology was also consulted at his request to explore dialysis options ??  5/13: Vital stable, developed some nausea and vomiting.  P.o. intake remains very poor.  Hemoglobin decreased to 7.9 and renal function seems stable with creatinine in 6.  Pending nephrology evaluation.  Another palliative care meeting planned for tomorrow as her second son is coming from out of state.  Increasing the dose of bicarb, also added low-dose Dilaudid and Compazine as she was having some back pain and unable to tolerate p.o.   Assessment and Plan: * Symptomatic anemia, suspect related to stage IIIb/IV chronic kidney disease Patient is chronically anemic and receives erythropoietin and as needed blood transfusions, most recently 4/28 No evidence of acute blood loss.  Stool guaiac negative Anemia secondary to CKD and chronic illness. Received 1 unit of PRBC-hemoglobin improved to 9.2 >>8.7>>7.9 today Palliative care was consulted-another family meeting planned for tomorrow after her other son is here.  Initial plan was to comfort focused care with hospice but now son also wants to explore options with nephrology-pending consult.  AKI (acute kidney injury) (HCC) superimposed on stage IIIb/IV chronic kidney disease High anion gap metabolic acidosis Secondary to fluid losses from diarrhea and intermittent vomiting as well as poor oral intake.  Anion gap improved. Creatinine  6.67>>7.25>>6.86, GFR remained the same at 5, up from baseline of 1.78 -IV hydration- -Continue oral bicarb-dose was increased today - Monitor renal function and avoid nephrotoxins -Patient will not be a candidate for dialysis, nephrology was consulted at son's request  Choledocholithiasis with chronic cholecystitis- not amenable to surgery due to age Diarrhea with intermittent nausea and vomiting LFTs WNL and abdominal exam benign - Lipase was elevated at 121 -Stool studies-negative -Supportive care -Patient is not a surgical candidate   Leukocytosis, acute on chronic Possibly reactive CT abdomen and pelvis was nonacute with no acute cholecystitis - Continue to monitor WBCs-slowly improving - Holding off on antibiotics for now - GI panel and stool for C. Difficile-negative  Frailty Suspect severe protein calorie malnutrition - Nutritionist evaluation as patient has very poor intake due to food intolerance related to her gallbladder issues       Subjective: Patient was having some lower back pain and nausea and vomiting when seen today.  Refusing all p.o. intake, stating that she is not feeling well.  Physical Exam: Vitals:   05/01/23 1959 05/02/23 0423 05/02/23 0835 05/02/23 0942  BP: (!) 111/52 (!) 107/48 (!) 106/47 (!) 124/54  Pulse: (!) 110 96 93 96  Resp: 18 17 16    Temp: 98.2 F (36.8 C) 98.1 F (36.7 C) 98.4 F (36.9 C) 98.1 F (36.7 C)  TempSrc:      SpO2: 100% 98% 100% 100%  Weight:      Height:       General.  Frail and emaciated elderly lady, in no acute distress. Pulmonary.  Lungs clear bilaterally, normal respiratory effort. CV.  Regular rate and rhythm, no JVD, rub or murmur. Abdomen.  Soft, nontender, nondistended, BS positive. CNS.  Lethargic but following commands.  No focal neurologic deficit. Extremities.  No edema, no cyanosis, pulses intact and symmetrical. Psychiatry.  Judgment and insight appears impaired  Data Reviewed: Prior data  reviewed  Family Communication: Discussed with son at bedside  Disposition: Status is: Inpatient Remains inpatient appropriate because: Severity of illness  Planned Discharge Destination:  To be determined  DVT prophylaxis.  Subcu heparin Time spent: 40 minutes  This record has been created using Conservation officer, historic buildings. Errors have been sought and corrected,but may not always be located. Such creation errors do not reflect on the standard of care.   Author: Arnetha Courser, MD 05/02/2023 12:32 PM  For on call review [email or web address].

## 2023-05-02 NOTE — Progress Notes (Addendum)
Initial Nutrition Assessment  DOCUMENTATION CODES:   Severe malnutrition in context of social or environmental circumstances  INTERVENTION:   Molli Posey Standard 1.0 chocolate po BID- each supplement provides 325kcal and 16g protein  MVI po daily   Pt at high refeed risk; recommend monitor potassium, magnesium and phosphorus labs daily until stable  Daily weights   NUTRITION DIAGNOSIS:   Severe Malnutrition related to social / environmental circumstances as evidenced by severe muscle depletion, severe fat depletion, percent weight loss.  GOAL:   Patient will meet greater than or equal to 90% of their needs  MONITOR:   PO intake, Supplement acceptance, Labs, Weight trends, I & O's, Skin  REASON FOR ASSESSMENT:   Malnutrition Screening Tool    ASSESSMENT:   89 y.o. female with medical history significant for CKD stage IIIb-IV, choledocholithiasis with chronic cholecystitis not amenable to surgery due to age, anemia of CKD on erythropoietin injections with frequent need for transfusions and recent hospitalization from 3/27 to 03/18/2023 for biliary colic who is admitted with anemia, AKI and FTT.  Met with pt and son in room today. History was provided by son at bedside. Son reports that's pt's appetite and oral intake has been decreasing over the past 6 months to one year. Son reports that patient has lost a lot of weight over the past year. Son reports that patient is barely eating much of anything in the hospital; pt is only eating sips and bites. Son has been providing patient with Boost Breeze mixed with water but reports that pt will only drink a few sips. Pt is lactose intolerant and is unable to tolerate anything with milk (including yogurt). Pt is also unable to tolerate Ensure/Boost. At baseline, pt eats a lot of chicken and barley soups. RD will advance pt to a mechanical soft diet as most items on the full liquid diet contain milk. Son would like to try and offer pt the  Molli Posey supplements that are plant based. Pt is at high refeed risk. Per chart, pt is down ~10lbs(12%) over the past month; this is significant weight loss. Palliative care following.     Medications reviewed and include: heparin, Na bicarbonate, LRS @75ml /hr   Labs reviewed: K 4.3 wnl, BUN 128(H), Ca 6.9(L) Wbc- 21.0(H), Hgb 7.9(L), Hct 24.0(L)  NUTRITION - FOCUSED PHYSICAL EXAM:  Flowsheet Row Most Recent Value  Orbital Region Severe depletion  Upper Arm Region Severe depletion  Thoracic and Lumbar Region Severe depletion  Buccal Region Moderate depletion  Temple Region Moderate depletion  Clavicle Bone Region Severe depletion  Clavicle and Acromion Bone Region Severe depletion  Scapular Bone Region Severe depletion  Dorsal Hand Severe depletion  Patellar Region Severe depletion  Anterior Thigh Region Severe depletion  Posterior Calf Region Severe depletion  Edema (RD Assessment) None  Hair Reviewed  Eyes Reviewed  Mouth Reviewed  Skin Reviewed  Nails Reviewed   Diet Order:   Diet Order             Diet full liquid Room service appropriate? Yes; Fluid consistency: Thin  Diet effective now                  EDUCATION NEEDS:   No education needs have been identified at this time  Skin:  Skin Assessment: Reviewed RN Assessment (ecchymosis)  Last BM:  5/12  Height:   Ht Readings from Last 1 Encounters:  04/29/23 4\' 9"  (1.448 m)    Weight:   Wt Readings from Last  1 Encounters:  04/29/23 31.8 kg    Ideal Body Weight:  44 kg  BMI:  Body mass index is 15.15 kg/m.  Estimated Nutritional Needs:   Kcal:  1000-1200kcal/day  Protein:  50-60g/day  Fluid:  800-1068ml/day  Julia Holiday MS, RD, LDN Please refer to [facility] for RD and/or RD on-call/weekend/after hours pager

## 2023-05-02 NOTE — Assessment & Plan Note (Signed)
Possibly reactive CT abdomen and pelvis was nonacute with no acute cholecystitis - Continue to monitor WBCs-slowly improving - Holding off on antibiotics for now - GI panel and stool for C. Difficile-negative

## 2023-05-02 NOTE — Assessment & Plan Note (Signed)
Diarrhea with intermittent nausea and vomiting LFTs WNL and abdominal exam benign - Lipase was elevated at 121 -Stool studies-negative -Supportive care -Patient is not a surgical candidate

## 2023-05-02 NOTE — Progress Notes (Signed)
Patient verbalized chest discomfort. Dr Nelson Chimes notified. EKG obtained

## 2023-05-02 NOTE — TOC Progression Note (Signed)
Transition of Care [facility]) - Progression Note    Patient Details  Name: Julia Kerr MRN: [id number] Date of Birth: [birth year]  Transition of Care [facility] CM/SW Contact  Margarito Liner, LCSW Phone Number: 05/02/2023, 1:59 PM  Clinical Narrative:  Updated [facility] social worker, Mamie Levers [phone]).   Expected Discharge Plan and Services                                               Social Determinants of Health (SDOH) Interventions SDOH Screenings   Food Insecurity: No Food Insecurity (04/29/2023)  Housing: Low Risk  (04/30/2023)  Transportation Needs: No Transportation Needs (04/30/2023)  Utilities: Not At Risk (04/30/2023)  Depression (PHQ2-9): Low Risk  (04/12/2022)  Tobacco Use: Low Risk  (04/29/2023)    Readmission Risk Interventions     No data to display

## 2023-05-02 NOTE — Consult Note (Signed)
[facility]  CONSULT NOTE    Date: 05/02/2023                  Patient Name:  Julia Kerr  MRN: [id number]  DOB: [birth year]  Age / Sex: 89 y.o., female         PCP: System, Provider Not In                 Service Requesting Consult: Dr. Nelson Chimes                 Reason for Consult: Acute kidney injury            History of Present Illness: Julia Kerr is a Spanish Speaking female who follows with [facility] in [place] (Dr. Ronalee Belts) for chronic kidney disease stage V. Patient also with anemia of chronic kidney disease requiring multiple PRBC transfusions.   Patient admitted to [facility] with shortness of breath. Found to have hemoglobin of 6.6 and given PRBC transfusion.   Patient had GI complaints but this has resolved.   History taken with assistance of son who is at bedside.    Medications: Outpatient medications: Medications Prior to Admission  Medication Sig Dispense Refill Last Dose   cephALEXin (KEFLEX) 500 MG capsule Take 1 capsule (500 mg total) by mouth 2 (two) times daily. 14 capsule 0    escitalopram (LEXAPRO) 5 MG tablet Take 5 mg by mouth daily.      Multiple Vitamin (MULTIVITAMIN WITH MINERALS) TABS tablet Take 1 tablet by mouth daily. 30 tablet 1    Polyethylene Glycol 400 (VISINE DRY EYE RELIEF OP) Place 1 drop into both eyes as needed (dry eyes).      sodium bicarbonate 650 MG tablet Take 1 tablet (650 mg total) by mouth 2 (two) times daily. 60 tablet 0     Current medications: Current Facility-Administered Medications  Medication Dose Route Frequency Provider Last Rate Last Admin   acetaminophen (TYLENOL) tablet 650 mg  650 mg Oral Q6H PRN Andris Baumann, MD       Or   acetaminophen (TYLENOL) suppository 650 mg  650 mg Rectal Q6H PRN Andris Baumann, MD       alum & mag hydroxide-simeth (MAALOX/MYLANTA) 200-200-20 MG/5ML suspension 15 mL  15 mL Oral Q4H PRN Arnetha Courser, MD   15 mL at 05/02/23 1100   escitalopram (LEXAPRO)  tablet 5 mg  5 mg Oral Daily Lindajo Royal V, MD   5 mg at 05/02/23 0839   feeding supplement (KATE FARMS STANDARD 1.4) liquid 325 mL  325 mL Oral BID BM Arnetha Courser, MD       heparin injection 5,000 Units  5,000 Units Subcutaneous Q8H Sharen Hones, RPH   5,000 Units at 05/01/23 2214   HYDROcodone-acetaminophen (NORCO/VICODIN) 5-325 MG per tablet 1-2 tablet  1-2 tablet Oral Q4H PRN Andris Baumann, MD   2 tablet at 05/01/23 0433   HYDROmorphone (DILAUDID) injection 0.5 mg  0.5 mg Intravenous Q4H PRN Arnetha Courser, MD   0.5 mg at 05/02/23 1335   lactated ringers infusion   Intravenous Continuous Arnetha Courser, MD 75 mL/hr at 05/02/23 1100 Rate Change at 05/02/23 1100   [START ON 05/03/2023] multivitamin with minerals tablet 1 tablet  1 tablet Oral Daily Arnetha Courser, MD       ondansetron (ZOFRAN) tablet 4 mg  4 mg Oral Q6H PRN Andris Baumann, MD   4 mg at 05/01/23 0945   Or  ondansetron (ZOFRAN) injection 4 mg  4 mg Intravenous Q6H PRN Andris Baumann, MD   4 mg at 05/02/23 0859   prochlorperazine (COMPAZINE) injection 5 mg  5 mg Intravenous Q6H PRN Arnetha Courser, MD       sodium bicarbonate tablet 1,300 mg  1,300 mg Oral BID Arnetha Courser, MD          Allergies: Allergies  Allergen Reactions   Eggs-Apples-Oats [Alitraq] Other (See Comments)    Strawberries and Apples cause burning of lips   Food Other (See Comments)    Apples and strawberries cause burning of lips   Lactose Intolerance (Gi) Diarrhea   Strawberry (Diagnostic) Other (See Comments)    Strawberry & apple causes burning of the lips   Strawberry Extract Other (See Comments)    Strawberry & apple allergy----causes burning of the lips      Past Medical History: Past Medical History:  Diagnosis Date   Anemia    Chronic kidney disease      Past Surgical History: Past Surgical History:  Procedure Laterality Date   CATARACT EXTRACTION, BILATERAL     SHOULDER SURGERY       Family History: History reviewed.  No pertinent family history.   Social History: Social History   Socioeconomic History   Marital status: Widowed    Spouse name: Not on file   Number of children: Not on file   Years of education: Not on file   Highest education level: Not on file  Occupational History   Not on file  Tobacco Use   Smoking status: Never   Smokeless tobacco: Never  Vaping Use   Vaping Use: Never used  Substance and Sexual Activity   Alcohol use: Never   Drug use: Never   Sexual activity: Not Currently  Other Topics Concern   Not on file  Social History Narrative   Not on file   Social Determinants of Health   Financial Resource Strain: Not on file  Food Insecurity: No Food Insecurity (04/29/2023)   Hunger Vital Sign    Worried About Running Out of Food in the Last Year: Never true    Ran Out of Food in the Last Year: Never true  Transportation Needs: No Transportation Needs (04/30/2023)   PRAPARE - Administrator, Civil Service (Medical): No    Lack of Transportation (Non-Medical): No  Physical Activity: Not on file  Stress: Not on file  Social Connections: Not on file  Intimate Partner Violence: Not At Risk (04/29/2023)   Humiliation, Afraid, Rape, and Kick questionnaire    Fear of Current or Ex-Partner: No    Emotionally Abused: No    Physically Abused: No    Sexually Abused: No     Vital Signs: Blood pressure (!) 124/54, pulse 96, temperature 98.1 F (36.7 C), resp. rate 16, height 4\' 9"  (1.448 m), weight 31.8 kg, SpO2 100 %.  Weight trends: Filed Weights   04/29/23 1632  Weight: 31.8 kg    Physical Exam: General: NAD, frail  Head: Normocephalic, atraumatic. Moist oral mucosal membranes  Eyes: Anicteric, PERRL  Neck: Supple, trachea midline  Lungs:  Clear to auscultation  Heart: Regular rate and rhythm  Abdomen:  Soft, nontender,   Extremities:  no peripheral edema.  Neurologic: Nonfocal, moving all four extremities  Skin: No lesions  Access: none      Lab results: Basic Metabolic Panel: Recent Labs  Lab 04/30/23 0122 05/01/23 1406 05/02/23 0904  NA 133* 136 137  K 4.3 4.5 4.3  CL 105 110 110  CO2 14* 10* 12*  GLUCOSE 118* 78 94  BUN 140* 134* 128*  CREATININE 6.65* 7.25* 6.86*  CALCIUM 7.3* 7.1* 6.9*    Liver Function Tests: Recent Labs  Lab 04/29/23 1636 04/30/23 0122  AST 18 16  ALT 11 10  ALKPHOS 101 97  BILITOT 0.9 0.9  PROT 6.9 6.4*  ALBUMIN 3.4* 3.2*   Recent Labs  Lab 04/29/23 1916  LIPASE 121*   No results for input(s): "AMMONIA" in the last 168 hours.  CBC: Recent Labs  Lab 04/29/23 1636 04/30/23 0122 04/30/23 1444 05/01/23 1406 05/02/23 0904  WBC 29.4* 25.7*  --  26.0* 21.0*  NEUTROABS 16.4*  --   --   --   --   HGB 7.3* 6.6* 9.2* 8.7* 7.9*  HCT 23.7* 20.7* 27.5* 26.0* 24.0*  MCV 95.6 92.0  --  90.3 89.9  PLT 241 208  --  198 180    Cardiac Enzymes: No results for input(s): "CKTOTAL", "CKMB", "CKMBINDEX", "TROPONINI" in the last 168 hours.  BNP: Invalid input(s): "POCBNP"  CBG: No results for input(s): "GLUCAP" in the last 168 hours.  Microbiology: Results for orders placed or performed during the hospital encounter of 04/29/23  Gastrointestinal Panel by PCR , Stool     Status: None   Collection Time: 04/30/23 10:30 AM   Specimen: Stool  Result Value Ref Range Status   Campylobacter species NOT DETECTED NOT DETECTED Final   Plesimonas shigelloides NOT DETECTED NOT DETECTED Final   Salmonella species NOT DETECTED NOT DETECTED Final   Yersinia enterocolitica NOT DETECTED NOT DETECTED Final   Vibrio species NOT DETECTED NOT DETECTED Final   Vibrio cholerae NOT DETECTED NOT DETECTED Final   Enteroaggregative E coli (EAEC) NOT DETECTED NOT DETECTED Final   Enteropathogenic E coli (EPEC) NOT DETECTED NOT DETECTED Final   Enterotoxigenic E coli (ETEC) NOT DETECTED NOT DETECTED Final   Shiga like toxin producing E coli (STEC) NOT DETECTED NOT DETECTED Final    Shigella/Enteroinvasive E coli (EIEC) NOT DETECTED NOT DETECTED Final   Cryptosporidium NOT DETECTED NOT DETECTED Final   Cyclospora cayetanensis NOT DETECTED NOT DETECTED Final   Entamoeba histolytica NOT DETECTED NOT DETECTED Final   Giardia lamblia NOT DETECTED NOT DETECTED Final   Adenovirus F40/41 NOT DETECTED NOT DETECTED Final   Astrovirus NOT DETECTED NOT DETECTED Final   Norovirus GI/GII NOT DETECTED NOT DETECTED Final   Rotavirus A NOT DETECTED NOT DETECTED Final   Sapovirus (I, II, IV, and V) NOT DETECTED NOT DETECTED Final    Comment: Performed at [facility], [address] [place], [place] [address]  C Difficile Quick Screen w PCR reflex     Status: None   Collection Time: 04/30/23 10:30 AM   Specimen: STOOL  Result Value Ref Range Status   C Diff antigen NEGATIVE NEGATIVE Final   C Diff toxin NEGATIVE NEGATIVE Final   C Diff interpretation No C. difficile detected.  Final    Comment: Performed at [facility], [address] [place], [place] [address]    Coagulation Studies: Recent Labs    04/29/23 1636  LABPROT 18.3*  INR 1.5*    Urinalysis: No results for input(s): "COLORURINE", "LABSPEC", "PHURINE", "GLUCOSEU", "HGBUR", "BILIRUBINUR", "KETONESUR", "PROTEINUR", "UROBILINOGEN", "NITRITE", "LEUKOCYTESUR" in the last 72 hours.  Invalid input(s): "APPERANCEUR"    Imaging: No results found.   Assessment & Plan: Ms. Bridgitte Baselice is a 89 y.o. hispanic female with  chronic kidney disease stage V followed by Dr. Ronalee Belts, who was admitted to [facility] on 04/29/2023 for Symptomatic anemia [D64.9] Acute renal failure superimposed on chronic kidney disease, unspecified acute renal failure type, unspecified CKD stage (HCC) [N17.9, N18.9]  Acute kidney injury on chronic kidney disease stage V: with chronic metabolic acidosis - patient and son both in agreement that patient does not want dialysis.  - continue supportive care.   Acute anemia on chronic  anemia of chronic kidney disease: status post PRBC transfusion.  Hypertension with chronic kidney disease: off blood pressure agents.      LOS: 2 Selia Wareing 5/13/20244:25 PM

## 2023-05-03 DIAGNOSIS — Z7189 Other specified counseling: Secondary | ICD-10-CM | POA: Diagnosis not present

## 2023-05-03 DIAGNOSIS — D649 Anemia, unspecified: Secondary | ICD-10-CM | POA: Diagnosis not present

## 2023-05-03 LAB — BPAM RBC
Blood Product Expiration Date: 202406032359
Unit Type and Rh: 5100

## 2023-05-03 LAB — TYPE AND SCREEN
ABO/RH(D): A POS
Unit division: 0
Unit division: 0
Unit division: 0

## 2023-05-03 NOTE — Progress Notes (Signed)
Progress Note   Patient: Julia Kerr XBJ:[id number] DOB: 12/31/1922 DOA: 04/29/2023     3 DOS: the patient was seen and examined on 05/03/2023   Brief hospital course: Julia Kerr is a 89 y.o. female with medical history significant for CKD stage IIIb-IV, choledocholithiasis with chronic cholecystitis not amenable to surgery due to age, anemia of CKD on erythropoietin injections with frequent need for transfusions most recently 4/28 in the ED, she presents with a complaint of shortness of breath that started on the day of arrival.  Patient had a recent hospitalization from 3/27 to 03/18/2023 for biliary colic that improved without surgical intervention.   5/11: Hemoglobin downtrending, repeat transfusion ordered: Renal function continues to deteriorate.  Son discussed initiating comfort care with palliative team 5/12: Family discrepancy on goals of care, comfort care on hold 5/13: Nephrology consulted for formal discussion about dialysis -patient is not a candidate given her advanced age and comorbid conditions 5/14: Continue to discuss goals of care with family, only daughter at bedside today, unable to contact the remainder of her siblings.  Apparently there are 5 children that need to be on "the same page" before any decision can be made. Holding off on further labs in the interim given patient's mental status, p.o. intake continues to deteriorate and she progresses from worsening uremia.  Continue bicarb, IV Dilaudid and Compazine for worsening pain unable to take p.o.   Assessment and Plan:   Goals of care -Lengthy discussion with daughter at bedside, apparently their 5 children that are in disagreement about goals of care.  Patient currently appears comfortable in the setting of progressive kidney disease unable to tolerate dialysis she would likely become more uremic over the next few days.  Discussed with family that there is very little that can be done without dialysis and given her  advanced age and comorbid conditions and the further procedures or interventions would carry their own risk likely outweigh any benefit. -Family meeting initially planned for later today with palliative care, unclear if family has been available as they were not answering the phone for me or patient's daughter at bedside this morning.   Acute on chronic symptomatic anemia Likely secondary to stage IIIb/IV chronic kidney disease -Chronically anemic requiring multiple transfusions and EPO in the past, most recent transfusion prior to hospitalization 4/28  -No clear indication or evidence of blood loss, fecal occult negative, no overt bruising or bleeding -Likely in the setting of advanced CKD, chronic illness, malnutrition -Status post 1 unit PRBC since admit -Hemoglobin continues to downtrend -Limit blood draws to avoid excessive phlebotomy, will check every 72 hours moving forward  AKI (acute kidney injury) (HCC) superimposed on stage IIIb/IV chronic kidney disease High anion gap metabolic acidosis -Secondary to fluid losses from diarrhea and intermittent vomiting as well as poor oral intake.   -Anion gap improved with fluids but borderline elevated in the setting of uremia -Creatinine baseline of 1.8 -markedly elevated > 6 - IV LR ongoing given poor p.o. intake - Patient not a candidate for dialysis, nephrology was consulted at son's request for formal evaluation  Acute metabolic encephalopathy, likely secondary to uremia -Again discussed comfort with family given risk of dialysis and patient  Choledocholithiasis with chronic cholecystitis- not amenable to surgery due to age Diarrhea with intermittent nausea and vomiting -Continue to follow clinically; Patient is not a surgical candidate given her increased risk of morbidity mortality in the setting of procedure and anesthesia.  Leukocytosis, acute on chronic -Likely reactive -no signs  or symptoms of infection at this time, no  indication for antibiotics -CT abdomen and pelvis was nonacute with no acute cholecystitis -GI panel and stool for C. Difficile-negative  Severe protein calorie malnutrition, failure to thrive - Nutritionist evaluation as patient has very poor intake due to food intolerance related to her gallbladder issues, advanced age      Subjective: Patient was having some lower back pain and nausea and vomiting when seen today.  Refusing all p.o. intake, stating that she is not feeling well.  Physical Exam: Vitals:   05/02/23 0942 05/02/23 1657 05/02/23 1935 05/03/23 0630  BP: (!) 124/54 (!) 101/46 (!) 112/50 (!) 107/57  Pulse: 96 (!) 109 (!) 106 (!) 103  Resp:  16 14 16   Temp: 98.1 F (36.7 C) 98.1 F (36.7 C) 98.8 F (37.1 C) 98.5 F (36.9 C)  TempSrc:   Axillary Oral  SpO2: 100% 98% 99% 98%  Weight:      Height:       General.  Frail and emaciated elderly lady, in no acute distress. Pulmonary.  Lungs clear bilaterally, normal respiratory effort. CV.  Regular rate and rhythm, no JVD, rub or murmur. Abdomen.  Soft, nontender, nondistended, BS positive. CNS.  Lethargic but following commands.  No focal neurologic deficit. Extremities.  No edema, no cyanosis, pulses intact and symmetrical. Psychiatry.  Judgment and insight appears impaired  Data Reviewed: Prior data reviewed  Family Communication: Discussed with daughter at bedside; attempted to call other family members with no answer  Disposition: Status is: Inpatient Remains inpatient appropriate because: Severity of illness  Planned Discharge Destination: To be determined, questionably discharge home with hospice versus facility pending family's discussion  DVT prophylaxis.  Subcu heparin Time spent: 40 minutes  Author: Carma Leaven DO 05/03/2023 7:15 AM  For on call review [email or web address].

## 2023-05-03 NOTE — Progress Notes (Signed)
[facility]  ROUNDING NOTE   Subjective:   Family meeting today with patient's multiple children.   Patient speaks Spanish only but prefers her family to speak for her.   Objective:  Vital signs in last 24 hours:  Temp:  [98 F (36.7 C)-98.8 F (37.1 C)] 98.5 F (36.9 C) (05/14 1708) Pulse Rate:  [102-111] 111 (05/14 1708) Resp:  [14-18] 18 (05/14 1708) BP: (107-114)/(47-57) 114/52 (05/14 1708) SpO2:  [96 %-99 %] 96 % (05/14 1708)  Weight change:  Filed Weights   04/29/23 1632  Weight: 31.8 kg    Intake/Output: I/O last 3 completed shifts: In: 1714.4 [I.V.:1714.4] Out: 50 [Urine:50]   Intake/Output this shift:  Total I/O In: 360 [P.O.:360] Out: -   Physical Exam: General: Frail, laying in bed  Head: Normocephalic, atraumatic. Moist oral mucosal membranes  Eyes: Anicteric, PERRL  Neck: Supple, trachea midline  Lungs:  Clear to auscultation  Heart: Regular rate and rhythm  Abdomen:  Soft, nontender,   Extremities:  no peripheral edema.  Neurologic: Nonfocal, moving all four extremities  Skin: No lesions  Access: none    Basic Metabolic Panel: Recent Labs  Lab 04/29/23 1636 04/30/23 0122 05/01/23 1406 05/02/23 0904  NA 134* 133* 136 137  K 4.5 4.3 4.5 4.3  CL 106 105 110 110  CO2 10* 14* 10* 12*  GLUCOSE 141* 118* 78 94  BUN 132* 140* 134* 128*  CREATININE 6.67* 6.65* 7.25* 6.86*  CALCIUM 7.4* 7.3* 7.1* 6.9*    Liver Function Tests: Recent Labs  Lab 04/29/23 1636 04/30/23 0122  AST 18 16  ALT 11 10  ALKPHOS 101 97  BILITOT 0.9 0.9  PROT 6.9 6.4*  ALBUMIN 3.4* 3.2*   Recent Labs  Lab 04/29/23 1916  LIPASE 121*   No results for input(s): "AMMONIA" in the last 168 hours.  CBC: Recent Labs  Lab 04/29/23 1636 04/30/23 0122 04/30/23 1444 05/01/23 1406 05/02/23 0904  WBC 29.4* 25.7*  --  26.0* 21.0*  NEUTROABS 16.4*  --   --   --   --   HGB 7.3* 6.6* 9.2* 8.7* 7.9*  HCT 23.7* 20.7* 27.5* 26.0* 24.0*  MCV 95.6 92.0   --  90.3 89.9  PLT 241 208  --  198 180    Cardiac Enzymes: No results for input(s): "CKTOTAL", "CKMB", "CKMBINDEX", "TROPONINI" in the last 168 hours.  BNP: Invalid input(s): "POCBNP"  CBG: No results for input(s): "GLUCAP" in the last 168 hours.  Microbiology: Results for orders placed or performed during the hospital encounter of 04/29/23  Gastrointestinal Panel by PCR , Stool     Status: None   Collection Time: 04/30/23 10:30 AM   Specimen: Stool  Result Value Ref Range Status   Campylobacter species NOT DETECTED NOT DETECTED Final   Plesimonas shigelloides NOT DETECTED NOT DETECTED Final   Salmonella species NOT DETECTED NOT DETECTED Final   Yersinia enterocolitica NOT DETECTED NOT DETECTED Final   Vibrio species NOT DETECTED NOT DETECTED Final   Vibrio cholerae NOT DETECTED NOT DETECTED Final   Enteroaggregative E coli (EAEC) NOT DETECTED NOT DETECTED Final   Enteropathogenic E coli (EPEC) NOT DETECTED NOT DETECTED Final   Enterotoxigenic E coli (ETEC) NOT DETECTED NOT DETECTED Final   Shiga like toxin producing E coli (STEC) NOT DETECTED NOT DETECTED Final   Shigella/Enteroinvasive E coli (EIEC) NOT DETECTED NOT DETECTED Final   Cryptosporidium NOT DETECTED NOT DETECTED Final   Cyclospora cayetanensis NOT DETECTED NOT DETECTED Final  Entamoeba histolytica NOT DETECTED NOT DETECTED Final   Giardia lamblia NOT DETECTED NOT DETECTED Final   Adenovirus F40/41 NOT DETECTED NOT DETECTED Final   Astrovirus NOT DETECTED NOT DETECTED Final   Norovirus GI/GII NOT DETECTED NOT DETECTED Final   Rotavirus A NOT DETECTED NOT DETECTED Final   Sapovirus (I, II, IV, and V) NOT DETECTED NOT DETECTED Final    Comment: Performed at [facility], [address] [place], [place] [address]  C Difficile Quick Screen w PCR reflex     Status: None   Collection Time: 04/30/23 10:30 AM   Specimen: STOOL  Result Value Ref Range Status   C Diff antigen NEGATIVE NEGATIVE Final   C  Diff toxin NEGATIVE NEGATIVE Final   C Diff interpretation No C. difficile detected.  Final    Comment: Performed at [facility], [address] [place], [place] [address]    Coagulation Studies: No results for input(s): "LABPROT", "INR" in the last 72 hours.  Urinalysis: No results for input(s): "COLORURINE", "LABSPEC", "PHURINE", "GLUCOSEU", "HGBUR", "BILIRUBINUR", "KETONESUR", "PROTEINUR", "UROBILINOGEN", "NITRITE", "LEUKOCYTESUR" in the last 72 hours.  Invalid input(s): "APPERANCEUR"    Imaging: No results found.   Medications:    lactated ringers 75 mL/hr at 05/02/23 2109    escitalopram  5 mg Oral Daily   feeding supplement (KATE FARMS STANDARD 1.4)  325 mL Oral BID BM   heparin injection (subcutaneous)  5,000 Units Subcutaneous Q8H   multivitamin with minerals  1 tablet Oral Daily   sodium bicarbonate  1,300 mg Oral BID   acetaminophen **OR** acetaminophen, alum & mag hydroxide-simeth, HYDROcodone-acetaminophen, HYDROmorphone (DILAUDID) injection, ondansetron **OR** ondansetron (ZOFRAN) IV, prochlorperazine  Assessment/ Plan:  [name] is a 89 y.o.  female with chronic kidney disease stage V followed by Dr. Ronalee Belts, who was admitted to [facility] on 04/29/2023 for Symptomatic anemia [D64.9] Acute renal failure superimposed on chronic kidney disease, unspecified acute renal failure type, unspecified CKD stage (HCC) [N17.9, N18.9]   Acute kidney injury on chronic kidney disease stage V: with chronic metabolic acidosis - patient and family all agree that patient will not benefit from dialysis.    - continue supportive care.  - Overall prognosis is poor despite frequent blood transfusions.  - Recommend hospice as patient's life expectancy is less than 6 months   Acute anemia on chronic anemia of chronic kidney disease: status post PRBC transfusions.   Hypertension with chronic kidney disease: off blood pressure agents.  Discussed case in detail with  patient's outpatient primary nephrologist, Dr. Ronalee Belts.    LOS: 3 [name] 5/14/20245:31 PM

## 2023-05-03 NOTE — Progress Notes (Addendum)
Daily Progress Note   Patient Name: Julia Kerr       Date: 05/03/2023 DOB: 1923/03/22  Age: 89 y.o. MRN#: [id number] Attending Physician: Azucena Fallen, MD Primary Care Physician: System, Provider Not In Admit Date: 04/29/2023  Reason for Consultation/Follow-up: Establishing goals of care  Subjective: Notes and labs reviewed.  In to see patient.  She is currently resting in bed quietly with her sister at bedside.  Stepped out to call son [name] who has stated he is the H POA.  Boneta Lucks states he wants all 5 of the siblings on the same page prior to making a decision.  He states that his brother [name] has came to [place], and is waiting to speak with nephrology.   [name] states that he has spoken with [facility], and they have discussed a plan for patient to discharge to a nursing facility that [facility] works with, with hospice level care there.  ADDENDUM: Returned to bedside to meet with son [name], and 3 siblings. [name] advises the 5th sibling (second daughter) is in [place] and is unavailable. Family speaks English and advises they are comfortable having conversation in [place], besides daughter who is present, and with options of hospital translator and family translator, she indicates she wants the family members to translate.   With this, nephrology entered to join and lead meeting with patient and family. With conversation, discussed patient is not a candidate from dialysis.  Discussed poor prognosis and planning. [name] discusses favoring option of [facility] dispo as he is alone with caring for their mother and does not have help. At this time, they would want to continue blood transfusions, but would like patient tucked in with comfort focused care for what time patient has left.       Length of Stay: 3  Current Medications: Scheduled Meds:   escitalopram  5 mg Oral Daily   feeding supplement (KATE FARMS STANDARD 1.4)  325 mL Oral BID BM   heparin injection (subcutaneous)  5,000 Units Subcutaneous Q8H   multivitamin with minerals  1 tablet Oral Daily   sodium bicarbonate  1,300 mg Oral BID    Continuous Infusions:  lactated ringers 75 mL/hr at 05/02/23 2109    PRN Meds: acetaminophen **OR** acetaminophen, alum & mag hydroxide-simeth, HYDROcodone-acetaminophen, HYDROmorphone (DILAUDID) injection, ondansetron **OR** ondansetron (ZOFRAN) IV,  prochlorperazine  Physical Exam Constitutional:      Comments: Eyes closed  Pulmonary:     Effort: Pulmonary effort is normal.             Vital Signs: BP (!) 107/47 (BP Location: Left Arm)   Pulse (!) 102   Temp 98 F (36.7 C)   Resp 18   Ht 4\' 9"  (1.448 m)   Wt 31.8 kg   SpO2 97%   BMI 15.15 kg/m  SpO2: SpO2: 97 % O2 Device: O2 Device: Room Air O2 Flow Rate:    Intake/output summary:  Intake/Output Summary (Last 24 hours) at 05/03/2023 1143 Last data filed at 05/03/2023 1045 Gross per 24 hour  Intake 845.53 ml  Output 50 ml  Net 795.53 ml   LBM: Last BM Date : 05/02/23 Baseline Weight: Weight: 31.8 kg Most recent weight: Weight: 31.8 kg    Patient Active Problem List   Diagnosis Date Noted   Leukocytosis, acute on chronic 04/29/2023   Frailty 04/29/2023   High anion gap metabolic acidosis 04/29/2023   Nausea vomiting and diarrhea 03/16/2023   Electrolyte abnormality 03/16/2023   Hyponatremia 01/29/2023   Depression 01/29/2023   Pressure injury of skin 01/29/2023   Thrombocytopenia (HCC) 01/29/2023   Hypomagnesemia 01/29/2023   CAP (community acquired pneumonia) 01/28/2023   Protein-calorie malnutrition, severe 08/04/2022   Anemia of chronic kidney failure, stage 4 (severe) (HCC) 08/03/2022   AKI (acute kidney injury) (HCC) superimposed on stage IIIb/IV chronic kidney disease 08/03/2022    Axillary lymphadenopathy 03/19/2022   History of pelvic fracture 12/2021 03/18/2022   Fracture of superior pubic ramus (HCC) 02/15/2022   Multiple closed pelvic fractures without disruption of pelvic ring (HCC) 01/18/2022   Multiple closed pelvic fractures without disruption of pelvic circle (HCC) 01/17/2022   Anemia of chronic renal failure, stage 3b (HCC) 01/17/2022   Seasonal allergic rhinitis 04/12/2021   Left sided sciatica 04/12/2021   Chronic rhinitis 02/06/2020   Adverse food reaction 02/06/2020   Vaccine counseling 02/06/2020   Cellulitis of lower extremity 08/18/2019   Insect bite 08/01/2019   Low serum thyroid stimulating hormone (TSH) 08/17/2018   Symptomatic anemia, suspect related to stage IIIb/IV chronic kidney disease 08/17/2018   Stage 3b chronic kidney disease (CKD) (HCC) 08/17/2018   Dehydration, moderate 08/17/2018   Low HDL (under 40) 08/17/2018   Vitamin D deficiency 08/04/2018   B12 deficiency-requiring injections in past 08/04/2018   Bruit of right carotid artery 08/04/2018   Choledocholithiasis with chronic cholecystitis- not amenable to surgery due to age 47/16/2019   Biliary colic symptom- intolerant to dairy and fats due to gallstone presence 08/04/2018   Environmental and seasonal allergies 08/04/2018   Postmenopausal bone loss 08/04/2018   Chronic fatigue- with B12 def 08/04/2018    Palliative Care Assessment & Plan     Recommendations/Plan: Nephrology and PMT collaborative meeting Son [name] states he and [facility] have discussed a plan for patient to be discharged to a nursing facility that [facility] works with, and hospice level care provided there.  Code Status:    Code Status Orders  (From admission, onward)           Start     Ordered   04/29/23 2133  Do not attempt resuscitation (DNR)  Continuous       Question Answer Comment  If patient has no pulse and is not breathing Do Not Attempt Resuscitation   If patient has a pulse and/or is  breathing: Medical Treatment Goals LIMITED  ADDITIONAL INTERVENTIONS: Use medication/IV fluids and cardiac monitoring as indicated; Do not use intubation or mechanical ventilation (DNI), also provide comfort medications.  Transfer to Progressive/Stepdown as indicated, avoid Intensive Care.   Consent: Discussion documented in EHR or advanced directives reviewed      04/29/23 2133           Code Status History     Date Active Date Inactive Code Status Order ID Comments User Context   03/16/2023 2006 03/18/2023 1817 DNR [id number]  Gertha Calkin, MD ED   03/16/2023 1858 03/16/2023 2006 DNR [id number]  Willy Eddy, MD ED   01/30/2023 1104 01/30/2023 1817 DNR [id number]  Marrion Coy, MD Inpatient   01/28/2023 2348 01/30/2023 1104 Full Code [id number]  Mansy, Vernetta Honey, MD ED   08/03/2022 1901 08/05/2022 2209 Full Code [id number]  Cox, Amy N, DO ED   03/18/2022 2011 03/19/2022 1856 Full Code [id number]  Andris Baumann, MD ED   02/19/2022 2134 02/20/2022 2129 Full Code [id number]  Gertha Calkin, MD ED   01/17/2022 1249 01/19/2022 2324 Full Code [id number]  Jonah Blue, MD ED       Prognosis:  < 6 months    Care plan was discussed with nephrology  Thank you for allowing the Palliative Medicine Team to assist in the care of this patient.  Morton Stall, NP  Please contact Palliative Medicine Team phone at [phone] for questions and concerns.

## 2023-05-04 DIAGNOSIS — N179 Acute kidney failure, unspecified: Secondary | ICD-10-CM | POA: Diagnosis not present

## 2023-05-04 DIAGNOSIS — Z66 Do not resuscitate: Secondary | ICD-10-CM

## 2023-05-04 DIAGNOSIS — D649 Anemia, unspecified: Secondary | ICD-10-CM | POA: Diagnosis not present

## 2023-05-04 DIAGNOSIS — Z7189 Other specified counseling: Secondary | ICD-10-CM | POA: Diagnosis not present

## 2023-05-04 DIAGNOSIS — Z515 Encounter for palliative care: Secondary | ICD-10-CM | POA: Diagnosis not present

## 2023-05-04 LAB — TYPE AND SCREEN
Antibody Screen: POSITIVE
Donor AG Type: NEGATIVE
Unit division: 0

## 2023-05-04 LAB — BPAM RBC

## 2023-05-04 LAB — HEMOGLOBIN AND HEMATOCRIT, BLOOD
HCT: 25 % — ABNORMAL LOW (ref 36.0–46.0)
Hemoglobin: 8.2 g/dL — ABNORMAL LOW (ref 12.0–15.0)

## 2023-05-04 NOTE — TOC Progression Note (Signed)
Transition of Care [facility]) - Progression Note    Patient Details  Name: Ralynn Mcginnity MRN: [id number] Date of Birth: 03-23-1923  [facility] CM/SW Contact  Margarito Liner, LCSW Phone Number: 05/04/2023, 11:06 AM  Clinical Narrative:  Son confirmed plan to return home and resume [facility] services. [facility] social worker is aware. 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 CSW will fax discharge summary to [facility] NP once available. Son will transport her home.   Expected Discharge Plan and Services                                               Social Determinants of Health (SDOH) Interventions SDOH Screenings   Food Insecurity: No Food Insecurity (04/29/2023)  Housing: Low Risk  (04/30/2023)  Transportation Needs: No Transportation Needs (04/30/2023)  Utilities: Not At Risk (04/30/2023)  Depression (PHQ2-9): Low Risk  (04/12/2022)  Tobacco Use: Low Risk  (04/29/2023)    Readmission Risk Interventions     No data to display

## 2023-05-04 NOTE — Progress Notes (Signed)
Julia Kerr to be D/C'd Home per MD order.  Discussed prescriptions and follow up appointments with the patient and the two sons. Prescriptions given to son, medication list explained in detail. Son verbalized understanding.  Allergies as of 05/04/2023       Reactions   Eggs-apples-oats [alitraq] Other (See Comments)   Strawberries and Apples cause burning of lips   Food Other (See Comments)   Apples and strawberries cause burning of lips   Lactose Intolerance (gi) Diarrhea   Strawberry (diagnostic) Other (See Comments)   Strawberry & apple causes burning of the lips   Strawberry Extract Other (See Comments)   Strawberry & apple allergy----causes burning of the lips        Medication List     STOP taking these medications    cephALEXin 500 MG capsule Commonly known as: KEFLEX   multivitamin with minerals Tabs tablet       TAKE these medications    escitalopram 5 MG tablet Commonly known as: LEXAPRO Take 5 mg by mouth daily.   sodium bicarbonate 650 MG tablet Take 1 tablet (650 mg total) by mouth 2 (two) times daily.   VISINE DRY EYE RELIEF OP Place 1 drop into both eyes as needed (dry eyes).        Vitals:   05/04/23 0458 05/04/23 0918  BP: (!) 112/57 (!) 111/58  Pulse: (!) 108 (!) 106  Resp: 16 18  Temp: 99 F (37.2 C) (!) 97.5 F (36.4 C)  SpO2: 100% 97%    Skin clean, dry and intact without evidence of skin break down, no evidence of skin tears noted. IV catheter discontinued intact. Site without signs and symptoms of complications. Dressing and pressure applied. Pt denies pain at this time. No complaints noted.  An After Visit Summary was printed and given to the patient. Patient escorted via WC, and D/C home via private auto.  Julia Kerr

## 2023-05-04 NOTE — Discharge Summary (Signed)
Physician Discharge Summary  Julia Kerr ZOX:[id number] DOB: [birth year] DOA: 04/29/2023  PCP: System, Provider Not In  Admit date: 04/29/2023 Discharge date: 05/04/2023  Admitted From: Home ([facility]) Disposition:  Home  Recommendations for Outpatient Follow-up:  Follow up with PCP in 1-2 weeks   Home Health:NA Equipment/Devices:None   Discharge Condition:Stable  CODE STATUS:DNR  Diet recommendation: Comfort  Brief/Interim Summary:  Julia Kerr is a 89 y.o. female with medical history significant for CKD stage IIIb-IV, choledocholithiasis with chronic cholecystitis not amenable to surgery due to age, anemia of CKD on erythropoietin injections with frequent need for transfusions most recently 4/28 in the ED, she presents with a complaint of shortness of breath that started on the day of arrival.  Patient had a recent hospitalization from 3/27 to 03/18/2023 for biliary colic that improved without surgical intervention.    5/11: Hemoglobin downtrending, repeat transfusion ordered: Renal function continues to deteriorate.  Son discussed initiating comfort care with palliative team 5/12: Family discrepancy on goals of care, comfort care on hold 5/13: Nephrology consulted for formal discussion about dialysis -patient is not a candidate given her advanced age and comorbid conditions 5/14: Continue to discuss goals of care with family, only daughter at bedside today, unable to contact the remainder of her siblings.  Apparently there are 5 children that need to be on "the same page" before any decision can be made. Holding off on further labs in the interim given patient's mental status, p.o. intake continues to deteriorate and she progresses from worsening uremia.  Continue bicarb, IV Dilaudid and Compazine for worsening pain unable to take p.o. 5/15: Patient established with [facility].  Patients family would like to bring her home with [facility] program followup and in home aide care.  Not enrolling in hospice  services at this time, however patient does remain an appropriate candidate for hospice.  Has not been requiring any prn narcotics at time of discharge.    Discharge Diagnoses:  Principal Problem:   Symptomatic anemia, suspect related to stage IIIb/IV chronic kidney disease Active Problems:   AKI (acute kidney injury) (HCC) superimposed on stage IIIb/IV chronic kidney disease   Choledocholithiasis with chronic cholecystitis- not amenable to surgery due to age   Leukocytosis, acute on chronic   Frailty   High anion gap metabolic acidosis  AKI on CKD stage IV Uremic encephalopathy Patient has had deteriorating kidney function.  Nephrology involved.  Multiple family discussions.  Patient not an appropriate candidate for initiation of hemodialysis at this time.  Family in agreement.  Plan of care consultation appreciated.  At time of discharge patient will return home with support from [facility] program and in-home aides.  Kidney function likely due to continued deteriorating.  If it fits within the family's goals of care patient does remain an appropriate candidate for initiation of hospice services.   Discharge Instructions  Discharge Instructions     Diet - low sodium heart healthy   Complete by: As directed    Increase activity slowly   Complete by: As directed       Allergies as of 05/04/2023       Reactions   Eggs-apples-oats [alitraq] Other (See Comments)   Strawberries and Apples cause burning of lips   Food Other (See Comments)   Apples and strawberries cause burning of lips   Lactose Intolerance (gi) Diarrhea   Strawberry (diagnostic) Other (See Comments)   Strawberry & apple causes burning of the lips   Strawberry Extract Other (See Comments)   Strawberry &  apple allergy----causes burning of the lips        Medication List     STOP taking these medications    cephALEXin 500 MG capsule Commonly known as: KEFLEX   escitalopram 5 MG tablet Commonly known as:  LEXAPRO   multivitamin with minerals Tabs tablet   sodium bicarbonate 650 MG tablet       TAKE these medications    VISINE DRY EYE RELIEF OP Place 1 drop into both eyes as needed (dry eyes).        Allergies  Allergen Reactions   Eggs-Apples-Oats [Alitraq] Other (See Comments)    Strawberries and Apples cause burning of lips   Food Other (See Comments)    Apples and strawberries cause burning of lips   Lactose Intolerance (Gi) Diarrhea   Strawberry (Diagnostic) Other (See Comments)    Strawberry & apple causes burning of the lips   Strawberry Extract Other (See Comments)    Strawberry & apple allergy----causes burning of the lips    Consultations: Nephrology Palliative care   Procedures/Studies: CT Renal Stone Study  Result Date: 04/29/2023 CLINICAL DATA:  Abdominal/flank pain, stone suspected EXAM: CT ABDOMEN AND PELVIS WITHOUT CONTRAST TECHNIQUE: Multidetector CT imaging of the abdomen and pelvis was performed following the standard protocol without IV contrast. RADIATION DOSE REDUCTION: This exam was performed according to the departmental dose-optimization program which includes automated exposure control, adjustment of the mA and/or kV according to patient size and/or use of iterative reconstruction technique. COMPARISON:  CT abdomen pelvis 03/16/2023, CT renal 03/30/2023 FINDINGS: Lower chest: No acute abnormality. Hepatobiliary: Stable nonspecific 1.2 cm right posterior hepatic lobe lesion. Multiple large (up to 2.5 cm) calcified gallstone noted within the gallbladder lumen. No gallbladder wall thickening or pericholecystic fluid. No biliary dilatation. Pancreas: No focal lesion. Normal pancreatic contour. No surrounding inflammatory changes. No main pancreatic ductal dilatation. Spleen: Stable splenic enlargement measuring up to 14.5 cm. No splenic lesion. Adrenals/Urinary Tract: No adrenal nodule bilaterally. Bilateral kidneys enhance symmetrically. No hydronephrosis.  No hydroureter. The urinary bladder is unremarkable. Stomach/Bowel: Stomach is within normal limits. No evidence of bowel wall thickening or dilatation. Colonic diverticulosis with no acute diverticulitis. Appendix appears normal. Vascular/Lymphatic: No abdominal aorta or iliac aneurysm. Severe atherosclerotic plaque of the aorta and its branches. Prominent but nonenlarged retroperitoneal lymph nodes. No abdominal, pelvic, or inguinal lymphadenopathy. Reproductive: Uterus and bilateral adnexa are unremarkable. Other: No intraperitoneal free fluid. No intraperitoneal free gas. No organized fluid collection. Musculoskeletal: No abdominal wall hernia or abnormality. No suspicious lytic or blastic osseous lesions. No acute displaced fracture. Old healed left superior and inferior pubic rami fracture. Stable L3 superior endplate compression fracture. Stable L5 sclerotic lesion and compression fracture. Stable breast 4 mm retropulsion into the central canal. IMPRESSION: 1. No cholelithiasis with no acute cholecystitis. 2. Colonic diverticulosis with no acute diverticulitis. 3. Stable splenomegaly. 4. Stable and L3 and L5 compression fractures. 5. Otherwise no acute intra-abdominal intrapelvic abnormality with limited evaluation on this noncontrast study. Electronically Signed   By: Tish Frederickson M.D.   On: 04/29/2023 20:13   DG Chest 2 View  Result Date: 04/29/2023 CLINICAL DATA:  Shortness of breath.  Syncope EXAM: CHEST - 2 VIEW COMPARISON:  X-ray 03/16/2023 and older FINDINGS: Hyperinflation. Stable cardiopericardial silhouette. Film is rotated to the left. Mild interstitial prominence could be chronic. No consolidation, pneumothorax or effusion. No edema. Degenerative changes are seen along the spine. Fixation hardware seen along the proximal right humerus. Prominent vascular calcifications in the upper  abdomen. IMPRESSION: Hyperinflation with likely chronic changes. Electronically Signed   By: Karen Kays M.D.    On: 04/29/2023 17:07      Subjective: Seen and examined on the day of discharge.  Stable no distress.  Mental status poor  Discharge Exam: Vitals:   05/04/23 0458 05/04/23 0918  BP: (!) 112/57 (!) 111/58  Pulse: (!) 108 (!) 106  Resp: 16 18  Temp: 99 F (37.2 C) (!) 97.5 F (36.4 C)  SpO2: 100% 97%   Vitals:   05/03/23 1708 05/03/23 2022 05/04/23 0458 05/04/23 0918  BP: (!) 114/52 (!) 100/50 (!) 112/57 (!) 111/58  Pulse: (!) 111 (!) 107 (!) 108 (!) 106  Resp: 18 16 16 18   Temp: 98.5 F (36.9 C) 98.1 F (36.7 C) 99 F (37.2 C) (!) 97.5 F (36.4 C)  TempSrc:      SpO2: 96% 97% 100% 97%  Weight:      Height:        General: Lethargic.  No visible distress Cardiovascular: RRR, S1/S2 +, no rubs, no gallops Respiratory: CTA bilaterally, no wheezing, no rhonchi Abdominal: Soft, NT, ND, bowel sounds + Extremities: no edema, no cyanosis    The results of significant diagnostics from this hospitalization (including imaging, microbiology, ancillary and laboratory) are listed below for reference.     Microbiology: Recent Results (from the past 240 hour(s))  Gastrointestinal Panel by PCR , Stool     Status: None   Collection Time: 04/30/23 10:30 AM   Specimen: Stool  Result Value Ref Range Status   Campylobacter species NOT DETECTED NOT DETECTED Final   Plesimonas shigelloides NOT DETECTED NOT DETECTED Final   Salmonella species NOT DETECTED NOT DETECTED Final   Yersinia enterocolitica NOT DETECTED NOT DETECTED Final   Vibrio species NOT DETECTED NOT DETECTED Final   Vibrio cholerae NOT DETECTED NOT DETECTED Final   Enteroaggregative E coli (EAEC) NOT DETECTED NOT DETECTED Final   Enteropathogenic E coli (EPEC) NOT DETECTED NOT DETECTED Final   Enterotoxigenic E coli (ETEC) NOT DETECTED NOT DETECTED Final   Shiga like toxin producing E coli (STEC) NOT DETECTED NOT DETECTED Final   Shigella/Enteroinvasive E coli (EIEC) NOT DETECTED NOT DETECTED Final    Cryptosporidium NOT DETECTED NOT DETECTED Final   Cyclospora cayetanensis NOT DETECTED NOT DETECTED Final   Entamoeba histolytica NOT DETECTED NOT DETECTED Final   Giardia lamblia NOT DETECTED NOT DETECTED Final   Adenovirus F40/41 NOT DETECTED NOT DETECTED Final   Astrovirus NOT DETECTED NOT DETECTED Final   Norovirus GI/GII NOT DETECTED NOT DETECTED Final   Rotavirus A NOT DETECTED NOT DETECTED Final   Sapovirus (I, II, IV, and V) NOT DETECTED NOT DETECTED Final    Comment: Performed at [facility], [address] [place], [place] [address]  C Difficile Quick Screen w PCR reflex     Status: None   Collection Time: 04/30/23 10:30 AM   Specimen: STOOL  Result Value Ref Range Status   C Diff antigen NEGATIVE NEGATIVE Final   C Diff toxin NEGATIVE NEGATIVE Final   C Diff interpretation No C. difficile detected.  Final    Comment: Performed at [facility], [address] [place], [place] [address]     Labs: BNP (last 3 results) No results for input(s): "BNP" in the last 8760 hours. Basic Metabolic Panel: Recent Labs  Lab 04/29/23 1636 04/30/23 0122 05/01/23 1406 05/02/23 0904  NA 134* 133* 136 137  K 4.5 4.3 4.5 4.3  CL 106 105 110 110  CO2 10* 14* 10* 12*  GLUCOSE 141* 118* 78 94  BUN 132* 140* 134* 128*  CREATININE 6.67* 6.65* 7.25* 6.86*  CALCIUM 7.4* 7.3* 7.1* 6.9*   Liver Function Tests: Recent Labs  Lab 04/29/23 1636 04/30/23 0122  AST 18 16  ALT 11 10  ALKPHOS 101 97  BILITOT 0.9 0.9  PROT 6.9 6.4*  ALBUMIN 3.4* 3.2*   Recent Labs  Lab 04/29/23 1916  LIPASE 121*   No results for input(s): "AMMONIA" in the last 168 hours. CBC: Recent Labs  Lab 04/29/23 1636 04/30/23 0122 04/30/23 1444 05/01/23 1406 05/02/23 0904 05/04/23 0515  WBC 29.4* 25.7*  --  26.0* 21.0*  --   NEUTROABS 16.4*  --   --   --   --   --   HGB 7.3* 6.6* 9.2* 8.7* 7.9* 8.2*  HCT 23.7* 20.7* 27.5* 26.0* 24.0* 25.0*  MCV 95.6 92.0  --  90.3 89.9  --   PLT  241 208  --  198 180  --    Cardiac Enzymes: No results for input(s): "CKTOTAL", "CKMB", "CKMBINDEX", "TROPONINI" in the last 168 hours. BNP: Invalid input(s): "POCBNP" CBG: No results for input(s): "GLUCAP" in the last 168 hours. D-Dimer No results for input(s): "DDIMER" in the last 72 hours. Hgb A1c No results for input(s): "HGBA1C" in the last 72 hours. Lipid Profile No results for input(s): "CHOL", "HDL", "LDLCALC", "TRIG", "CHOLHDL", "LDLDIRECT" in the last 72 hours. Thyroid function studies No results for input(s): "TSH", "T4TOTAL", "T3FREE", "THYROIDAB" in the last 72 hours.  Invalid input(s): "FREET3" Anemia work up No results for input(s): "VITAMINB12", "FOLATE", "FERRITIN", "TIBC", "IRON", "RETICCTPCT" in the last 72 hours. Urinalysis    Component Value Date/Time   COLORURINE RED (A) 03/30/2023 1944   APPEARANCEUR TURBID (A) 03/30/2023 1944   LABSPEC 1.012 03/30/2023 1944   PHURINE  03/30/2023 1944    TEST NOT REPORTED DUE TO COLOR INTERFERENCE OF URINE PIGMENT   GLUCOSEU (A) 03/30/2023 1944    TEST NOT REPORTED DUE TO COLOR INTERFERENCE OF URINE PIGMENT   HGBUR (A) 03/30/2023 1944    TEST NOT REPORTED DUE TO COLOR INTERFERENCE OF URINE PIGMENT   BILIRUBINUR (A) 03/30/2023 1944    TEST NOT REPORTED DUE TO COLOR INTERFERENCE OF URINE PIGMENT   KETONESUR (A) 03/30/2023 1944    TEST NOT REPORTED DUE TO COLOR INTERFERENCE OF URINE PIGMENT   PROTEINUR (A) 03/30/2023 1944    TEST NOT REPORTED DUE TO COLOR INTERFERENCE OF URINE PIGMENT   NITRITE (A) 03/30/2023 1944    TEST NOT REPORTED DUE TO COLOR INTERFERENCE OF URINE PIGMENT   LEUKOCYTESUR (A) 03/30/2023 1944    TEST NOT REPORTED DUE TO COLOR INTERFERENCE OF URINE PIGMENT   Sepsis Labs Recent Labs  Lab 04/29/23 1636 04/30/23 0122 05/01/23 1406 05/02/23 0904  WBC 29.4* 25.7* 26.0* 21.0*   Microbiology Recent Results (from the past 240 hour(s))  Gastrointestinal Panel by PCR , Stool     Status: None    Collection Time: 04/30/23 10:30 AM   Specimen: Stool  Result Value Ref Range Status   Campylobacter species NOT DETECTED NOT DETECTED Final   Plesimonas shigelloides NOT DETECTED NOT DETECTED Final   Salmonella species NOT DETECTED NOT DETECTED Final   Yersinia enterocolitica NOT DETECTED NOT DETECTED Final   Vibrio species NOT DETECTED NOT DETECTED Final   Vibrio cholerae NOT DETECTED NOT DETECTED Final   Enteroaggregative E coli (EAEC) NOT DETECTED NOT DETECTED Final  Enteropathogenic E coli (EPEC) NOT DETECTED NOT DETECTED Final   Enterotoxigenic E coli (ETEC) NOT DETECTED NOT DETECTED Final   Shiga like toxin producing E coli (STEC) NOT DETECTED NOT DETECTED Final   Shigella/Enteroinvasive E coli (EIEC) NOT DETECTED NOT DETECTED Final   Cryptosporidium NOT DETECTED NOT DETECTED Final   Cyclospora cayetanensis NOT DETECTED NOT DETECTED Final   Entamoeba histolytica NOT DETECTED NOT DETECTED Final   Giardia lamblia NOT DETECTED NOT DETECTED Final   Adenovirus F40/41 NOT DETECTED NOT DETECTED Final   Astrovirus NOT DETECTED NOT DETECTED Final   Norovirus GI/GII NOT DETECTED NOT DETECTED Final   Rotavirus A NOT DETECTED NOT DETECTED Final   Sapovirus (I, II, IV, and V) NOT DETECTED NOT DETECTED Final    Comment: Performed at [facility], [address] [place], [place] [address]  C Difficile Quick Screen w PCR reflex     Status: None   Collection Time: 04/30/23 10:30 AM   Specimen: STOOL  Result Value Ref Range Status   C Diff antigen NEGATIVE NEGATIVE Final   C Diff toxin NEGATIVE NEGATIVE Final   C Diff interpretation No C. difficile detected.  Final    Comment: Performed at [facility], [address] [place], [place] [address]     Time coordinating discharge: Over 30 minutes  SIGNED:   Tresa Moore, MD  [facility] 05/04/2023, 11:37 AM Pager   If 7PM-7AM, please contact night-coverage

## 2023-05-04 NOTE — Progress Notes (Signed)
Daily Progress Note   Patient Name: Julia Kerr       Date: 05/04/2023 DOB: November 30, 1923  Age: 89 y.o. MRN#: [id number] Attending Physician: Tresa Moore, MD Primary Care Physician: System, Provider Not In Admit Date: 04/29/2023  Reason for Consultation/Follow-up: Establishing goals of care  Subjective: Notes reviewed.   Received call from patient's son this morning regarding discharge plan. He shares with me family has decided to take patient home with support of [facility] but to focus on her comfort. He asks me if she can continue to receive blood transfusions. I explain this is something he will need to discuss with [facility]. We discuss this is unlikely to change "big picture". He expresses understanding.   Went to bedside and patient's other son was visiting. He explains the patient has not been eating. He tells me she is mostly sleeping all of the time. We discuss stages of illness. Discuss that as body nears end of life forcing food can lead to discomfort. He expresses understanding and explains he is providing bites to her when she wants but when she declines food he allows it. He follows her wishes and lets her requests guide his care. He explains the main goal is to get patient home and ensure she is not suffering.   Shared family's expressed wishes with [facility] team and Dr. Georgeann Oppenheim.    Length of Stay: 4  Current Medications: Scheduled Meds:    Continuous Infusions:   PRN Meds: acetaminophen **OR** acetaminophen, alum & mag hydroxide-simeth, HYDROcodone-acetaminophen, HYDROmorphone (DILAUDID) injection, ondansetron **OR** ondansetron (ZOFRAN) IV, prochlorperazine  Physical Exam Constitutional:      General: She is not in acute distress.    Comments: Sleeping, doesn't wake to voice   Pulmonary:     Effort: Pulmonary effort is normal.  Skin:    General: Skin is warm and dry.             Vital Signs: BP (!) 111/58 (BP Location: Left Arm)   Pulse (!) 106   Temp (!) 97.5 F (36.4 C)   Resp 18   Ht 4\' 9"  (1.448 m)   Wt 31.8 kg   SpO2 97%   BMI 15.15 kg/m  SpO2: SpO2: 97 % O2 Device: O2 Device: Room Air O2 Flow Rate:    Intake/output summary:  Intake/Output Summary (Last  24 hours) at 05/04/2023 1345 Last data filed at 05/04/2023 1106 Gross per 24 hour  Intake 2781.12 ml  Output --  Net 2781.12 ml    LBM: Last BM Date : 05/02/23 Baseline Weight: Weight: 31.8 kg Most recent weight: Weight: 31.8 kg    Patient Active Problem List   Diagnosis Date Noted   Leukocytosis, acute on chronic 04/29/2023   Frailty 04/29/2023   High anion gap metabolic acidosis 04/29/2023   Nausea vomiting and diarrhea 03/16/2023   Electrolyte abnormality 03/16/2023   Hyponatremia 01/29/2023   Depression 01/29/2023   Pressure injury of skin 01/29/2023   Thrombocytopenia (HCC) 01/29/2023   Hypomagnesemia 01/29/2023   CAP (community acquired pneumonia) 01/28/2023   Protein-calorie malnutrition, severe 08/04/2022   Anemia of chronic kidney failure, stage 4 (severe) (HCC) 08/03/2022   AKI (acute kidney injury) (HCC) superimposed on stage IIIb/IV chronic kidney disease 08/03/2022   Axillary lymphadenopathy 03/19/2022   History of pelvic fracture 12/2021 03/18/2022   Fracture of superior pubic ramus (HCC) 02/15/2022   Multiple closed pelvic fractures without disruption of pelvic ring (HCC) 01/18/2022   Multiple closed pelvic fractures without disruption of pelvic circle (HCC) 01/17/2022   Anemia of chronic renal failure, stage 3b (HCC) 01/17/2022   Seasonal allergic rhinitis 04/12/2021   Left sided sciatica 04/12/2021   Chronic rhinitis 02/06/2020   Adverse food reaction 02/06/2020   Vaccine counseling 02/06/2020   Cellulitis of lower extremity 08/18/2019   Insect bite  08/01/2019   Low serum thyroid stimulating hormone (TSH) 08/17/2018   Symptomatic anemia, suspect related to stage IIIb/IV chronic kidney disease 08/17/2018   Stage 3b chronic kidney disease (CKD) (HCC) 08/17/2018   Dehydration, moderate 08/17/2018   Low HDL (under 40) 08/17/2018   Vitamin D deficiency 08/04/2018   B12 deficiency-requiring injections in past 08/04/2018   Bruit of right carotid artery 08/04/2018   Choledocholithiasis with chronic cholecystitis- not amenable to surgery due to age 47/16/2019   Biliary colic symptom- intolerant to dairy and fats due to gallstone presence 08/04/2018   Environmental and seasonal allergies 08/04/2018   Postmenopausal bone loss 08/04/2018   Chronic fatigue- with B12 def 08/04/2018    Palliative Care Assessment & Plan     Recommendations/Plan: Family now requesting discharge home with support of [facility] - [facility] team aware  Care plan was discussed with patient's sons [name] and Jamison Neighbor  Thank you for allowing the Palliative Medicine Team to assist in the care of this patient.  Janeece Agee, NP  Please contact Palliative Medicine Team phone at [phone] for questions and concerns.

## 2023-05-04 NOTE — TOC Transition Note (Signed)
Transition of Care [facility]) - CM/SW Discharge Note   Patient Details  Name: Julia Kerr MRN: [id number] Date of Birth: 1923-03-05  [facility] CM/SW Contact:  Margarito Liner, LCSW Phone Number: 05/04/2023, 11:46 AM   Clinical Narrative:  Patient has orders to discharge home today. Faxed discharge summary to NP at [facility]. No further concerns. CSW signing off.   Final next level of care: Home/Self Care (with CNA through [facility]) Barriers to Discharge: Barriers Resolved   Patient Goals and CMS Choice      Discharge Placement                  Patient to be transferred to facility by: Son Name of family member notified: Earle Gell Patient and family notified of of transfer: 05/04/23  Discharge Plan and Services Additional resources added to the After Visit Summary for                                       Social Determinants of Health (SDOH) Interventions SDOH Screenings   Food Insecurity: No Food Insecurity (04/29/2023)  Housing: Low Risk  (04/30/2023)  Transportation Needs: No Transportation Needs (04/30/2023)  Utilities: Not At Risk (04/30/2023)  Depression (PHQ2-9): Low Risk  (04/12/2022)  Tobacco Use: Low Risk  (04/29/2023)     Readmission Risk Interventions     No data to display

## 2023-05-04 NOTE — Progress Notes (Signed)
[facility]  ROUNDING NOTE   Subjective:   Patient being discharged to [facility].   Family at bedside.    Objective:  Vital signs in last 24 hours:  Temp:  [97.5 F (36.4 C)-99 F (37.2 C)] 97.5 F (36.4 C) (05/15 0918) Pulse Rate:  [106-111] 106 (05/15 0918) Resp:  [16-18] 18 (05/15 0918) BP: (100-114)/(50-58) 111/58 (05/15 0918) SpO2:  [96 %-100 %] 97 % (05/15 0918)  Weight change:  Filed Weights   04/29/23 1632  Weight: 31.8 kg    Intake/Output: I/O last 3 completed shifts: In: 2781.1 [P.O.:410; I.V.:2371.1] Out: 50 [Urine:50]   Intake/Output this shift:  Total I/O In: 120 [P.O.:120] Out: -   Physical Exam: General: Frail, laying in bed  Head: Normocephalic, atraumatic. Moist oral mucosal membranes  Eyes: Anicteric, PERRL  Neck: Supple, trachea midline  Lungs:  Clear to auscultation  Heart: Regular rate and rhythm  Abdomen:  Soft, nontender,   Extremities:  no peripheral edema.  Neurologic: Nonfocal, moving all four extremities  Skin: No lesions  Access: none    Basic Metabolic Panel: Recent Labs  Lab 04/29/23 1636 04/30/23 0122 05/01/23 1406 05/02/23 0904  NA 134* 133* 136 137  K 4.5 4.3 4.5 4.3  CL 106 105 110 110  CO2 10* 14* 10* 12*  GLUCOSE 141* 118* 78 94  BUN 132* 140* 134* 128*  CREATININE 6.67* 6.65* 7.25* 6.86*  CALCIUM 7.4* 7.3* 7.1* 6.9*     Liver Function Tests: Recent Labs  Lab 04/29/23 1636 04/30/23 0122  AST 18 16  ALT 11 10  ALKPHOS 101 97  BILITOT 0.9 0.9  PROT 6.9 6.4*  ALBUMIN 3.4* 3.2*    Recent Labs  Lab 04/29/23 1916  LIPASE 121*    No results for input(s): "AMMONIA" in the last 168 hours.  CBC: Recent Labs  Lab 04/29/23 1636 04/30/23 0122 04/30/23 1444 05/01/23 1406 05/02/23 0904 05/04/23 0515  WBC 29.4* 25.7*  --  26.0* 21.0*  --   NEUTROABS 16.4*  --   --   --   --   --   HGB 7.3* 6.6* 9.2* 8.7* 7.9* 8.2*  HCT 23.7* 20.7* 27.5* 26.0* 24.0* 25.0*  MCV 95.6 92.0  --  90.3 89.9  --    PLT 241 208  --  198 180  --      Cardiac Enzymes: No results for input(s): "CKTOTAL", "CKMB", "CKMBINDEX", "TROPONINI" in the last 168 hours.  BNP: Invalid input(s): "POCBNP"  CBG: No results for input(s): "GLUCAP" in the last 168 hours.  Microbiology: Results for orders placed or performed during the hospital encounter of 04/29/23  Gastrointestinal Panel by PCR , Stool     Status: None   Collection Time: 04/30/23 10:30 AM   Specimen: Stool  Result Value Ref Range Status   Campylobacter species NOT DETECTED NOT DETECTED Final   Plesimonas shigelloides NOT DETECTED NOT DETECTED Final   Salmonella species NOT DETECTED NOT DETECTED Final   Yersinia enterocolitica NOT DETECTED NOT DETECTED Final   Vibrio species NOT DETECTED NOT DETECTED Final   Vibrio cholerae NOT DETECTED NOT DETECTED Final   Enteroaggregative E coli (EAEC) NOT DETECTED NOT DETECTED Final   Enteropathogenic E coli (EPEC) NOT DETECTED NOT DETECTED Final   Enterotoxigenic E coli (ETEC) NOT DETECTED NOT DETECTED Final   Shiga like toxin producing E coli (STEC) NOT DETECTED NOT DETECTED Final   Shigella/Enteroinvasive E coli (EIEC) NOT DETECTED NOT DETECTED Final   Cryptosporidium NOT DETECTED NOT DETECTED  Final   Cyclospora cayetanensis NOT DETECTED NOT DETECTED Final   Entamoeba histolytica NOT DETECTED NOT DETECTED Final   Giardia lamblia NOT DETECTED NOT DETECTED Final   Adenovirus F40/41 NOT DETECTED NOT DETECTED Final   Astrovirus NOT DETECTED NOT DETECTED Final   Norovirus GI/GII NOT DETECTED NOT DETECTED Final   Rotavirus A NOT DETECTED NOT DETECTED Final   Sapovirus (I, II, IV, and V) NOT DETECTED NOT DETECTED Final    Comment: Performed at [facility], [address], [place], [place] [address]  C Difficile Quick Screen w PCR reflex     Status: None   Collection Time: 04/30/23 10:30 AM   Specimen: STOOL  Result Value Ref Range Status   C Diff antigen NEGATIVE NEGATIVE Final   C Diff  toxin NEGATIVE NEGATIVE Final   C Diff interpretation No C. difficile detected.  Final    Comment: Performed at [facility], [address] [place], [place] [address]    Coagulation Studies: No results for input(s): "LABPROT", "INR" in the last 72 hours.  Urinalysis: No results for input(s): "COLORURINE", "LABSPEC", "PHURINE", "GLUCOSEU", "HGBUR", "BILIRUBINUR", "KETONESUR", "PROTEINUR", "UROBILINOGEN", "NITRITE", "LEUKOCYTESUR" in the last 72 hours.  Invalid input(s): "APPERANCEUR"    Imaging: No results found.   Medications:       acetaminophen **OR** acetaminophen, alum & mag hydroxide-simeth, HYDROcodone-acetaminophen, HYDROmorphone (DILAUDID) injection, ondansetron **OR** ondansetron (ZOFRAN) IV, prochlorperazine  Assessment/ Plan:  Julia Kerr is a 89 y.o.  female with chronic kidney disease stage V followed by Dr. Ronalee Belts, who was admitted to [facility] on 04/29/2023 for Symptomatic anemia [D64.9] Acute renal failure superimposed on chronic kidney disease, unspecified acute renal failure type, unspecified CKD stage (HCC) [N17.9, N18.9]   Acute kidney injury on chronic kidney disease stage V: with chronic metabolic acidosis - patient and family all agree that patient will not benefit from dialysis.    - continue supportive care.  - Overall prognosis is poor despite frequent blood transfusions.    Acute anemia on chronic anemia of chronic kidney disease: status post PRBC transfusions.   Hypertension with chronic kidney disease: off blood pressure agents.   Discussed case in detail with patient's outpatient primary nephrologist, Dr. Ronalee Belts.    LOS: 4 Julia Kerr 5/15/20241:16 PM

## 2023-05-21 DEATH — deceased
